# Patient Record
Sex: Female | Born: 1935 | Race: Black or African American | Hispanic: No | Marital: Single
Health system: Midwestern US, Academic
[De-identification: ages and names within clinical notes are randomized; demographics above are authoritative.]

## PROBLEM LIST (undated history)

## (undated) DIAGNOSIS — N3941 Urge incontinence: Principal | ICD-10-CM

## (undated) DIAGNOSIS — I10 Essential (primary) hypertension: Secondary | ICD-10-CM

## (undated) DIAGNOSIS — D175 Benign lipomatous neoplasm of intra-abdominal organs: Secondary | ICD-10-CM

## (undated) DIAGNOSIS — E782 Mixed hyperlipidemia: Secondary | ICD-10-CM

## (undated) DIAGNOSIS — E785 Hyperlipidemia, unspecified: Secondary | ICD-10-CM

## (undated) DIAGNOSIS — K869 Disease of pancreas, unspecified: Secondary | ICD-10-CM

## (undated) DIAGNOSIS — F0390 Unspecified dementia without behavioral disturbance: Secondary | ICD-10-CM

## (undated) DIAGNOSIS — Z1231 Encounter for screening mammogram for malignant neoplasm of breast: Secondary | ICD-10-CM

## (undated) DIAGNOSIS — R944 Abnormal results of kidney function studies: Secondary | ICD-10-CM

## (undated) DIAGNOSIS — R739 Hyperglycemia, unspecified: Secondary | ICD-10-CM

## (undated) DIAGNOSIS — N201 Calculus of ureter: Secondary | ICD-10-CM

## (undated) DIAGNOSIS — E7800 Pure hypercholesterolemia, unspecified: Secondary | ICD-10-CM

## (undated) LAB — HM PAP SMEAR: HM Pap smear: NEGATIVE

## (undated) LAB — HM COLONOSCOPY: HM Colonoscopy: NEGATIVE

## (undated) LAB — HM MAMMOGRAPHY
HM Mammogram: NEGATIVE
HM Mammogram: NEGATIVE

---

## 2005-07-04 LAB — COMPREHENSIVE METABOLIC PANEL
A/G Ratio: 1.6 (ref 1.0–2.1)
ALT: 14 units/L (ref 6–40)
AST: 18 units/L (ref 10–35)
Albumin: 4.1 g/dL (ref 3.6–5.1)
Alkaline Phosphatase: 49 units/L (ref 33–130)
BUN/Creatinine Ratio: 21 (ref 6–22)
BUN: 21 mg/dL (ref 7–25)
CO2: 31 mmol/L (ref 21–33)
Calcium: 9.2 mg/dL (ref 8.6–10.2)
Chloride: 103 mmol/L (ref 98–110)
Creatinine: 1 mg/dL (ref 0.5–1.2)
GFR MDRD Non Af Amer: 58 mL/min (ref >=60)
Globulin, Total: 2.6 g/dL (ref 2.2–3.9)
Glucose: 88 mg/dL (ref 65–99)
Potassium: 3.9 mmol/L (ref 3.5–5.3)
Sodium: 139 mmol/L (ref 135–146)
Total Bilirubin: 0.6 mg/dL (ref 0.2–1.2)
Total Protein: 6.7 g/dL (ref 6.2–8.3)

## 2005-07-04 LAB — LIPID PANEL
Chol/HDL Ratio: 2.3 (ref <=5.0)
Cholesterol, Total: 175 mg/dL (ref 125–200)
HDL: 75 mg/dL (ref >=40)
LDL Cholesterol: 93 mg/dL (ref <130)
Triglycerides: 36 mg/dL (ref <150)

## 2005-07-04 NOTE — Unmapped (Signed)
Signed by   [employer]Log[employer] on 07/05/2005 at 07:40:36  Patient: Stephanie Suarez  Note: All result statuses are Final unless otherwise noted.    Tests: (1) COMPREHENSIVE METABOLIC PANEL W/EGFR (QDL-10231)    GLUCOSE                   88 mg/dL                    24-40                  FASTING REFERENCE INTERVAL    UREA NITROGEN (BUN)       21 mg/dL                    1-02    CREATININE                1.0 mg/dL                   7.2-5.3    GFR ESTIMATED        [L]  58 mL/min/1.52m2            > OR = 60      IF THE PATIENT IS AFRICAN-AMERICAN, PLEASE MULTIPLY      THIS RESULT BY 1.21. THIS RESULT HAS BEEN CALCULATED      ASSUMING THE PATIENT IS NON-AFRICAN AMERICAN.    BUN/CREATININE RATIO (calc)                              21                          6-22    SODIUM                    139 mmol/L                  135-146    POTASSIUM                 3.9 mmol/L                  3.5-5.3    CHLORIDE                  103 mmol/L                  98-110    CARBON DIOXIDE            31 mmol/L                   21-33    CALCIUM                   9.2 mg/dL                   6.6-44.0    PROTEIN, TOTAL            6.7 g/dL                    3.4-7.4    ALBUMIN                   4.1 g/dL                    2.5-9.5    GLOBULIN (calc)  2.6 g/dL                    1.6-1.0   ALBUMIN/GLOBULIN RATIO (calc)                              1.6                         1.0-2.1    BILIRUBIN, TOTAL          0.6 mg/dL                   9.6-0.4    ALKALINE PHOSPHATASE      49 U/L                      33-130    AST                       18 U/L                      10-35    ALT                       14 U/L                      6-40    Note: An exclamation mark (!) indicates a result that was not dispersed into   the flowsheet.  Document Creation Date: 07/05/2005 7:40 AM  _______________________________________________________________________    (1) Order result status: Final  Collection or observation date-time: 07/04/2005 10:16  Requested date-time:    Receipt date-time: 07/04/2005 21:34  Reported date-time: 07/05/2005 07:00  Referring Physician:    Ordering Physician: Maralyn Sago Keyly Baldonado (PRITTSS)  Specimen Source: S  Source: Lucien Mons Order Number: [id number]  Lab site: [facility] [place]      [address]      [place]  [place]  [address]

## 2005-07-04 NOTE — Unmapped (Signed)
Signed by   LinkLogic on 07/05/2005 at 07:40:35  Patient: Stephanie Suarez  Note: All result statuses are Final unless otherwise noted.    Tests: (1) LIPID PANEL (QDL-7600)    TRIGLYCERIDES             36 mg/dL                    <546    CHOLESTEROL, TOTAL        175 mg/dL                   270-350    HDL CHOLESTEROL           75 mg/dL                    > OR = 40   LDL-CHOLESTEROL (calc)                              93 mg/dL                    <093             DESIRABLE RANGE <100 MG/DL FOR PATIENTS WITH CHD OR      DIABETES AND <70 MG/DL FOR DIABETIC PATIENTS WITH      KNOWN HEART DISEASE.          CHOL/HDLC RATIO (calc)                              2.3                         < OR = 5.0    Note: An exclamation mark (!) indicates a result that was not dispersed into   the flowsheet.  Document Creation Date: 07/05/2005 7:40 AM  _______________________________________________________________________    (1) Order result status: Final  Collection or observation date-time: 07/04/2005 10:16  Requested date-time:   Receipt date-time: 07/04/2005 21:34  Reported date-time: 07/05/2005 07:00  Referring Physician:    Ordering Physician: Maralyn Sago Layan Zalenski (PRITTSS)  Specimen Source: S  Source: Lucien Mons Order Number: [id number]  Lab site: [facility] [place]      [address]      [place]  [place]  [address]

## 2006-06-26 LAB — BASIC METABOLIC PANEL
BUN/Creatinine Ratio: 26 (ref 6–22)
BUN: 23 mg/dL (ref 7–25)
CO2: 30 mmol/L (ref 21–33)
Calcium: 9.6 mg/dL (ref 8.6–10.2)
Chloride: 109 mmol/L (ref 98–110)
Creatinine: 0.9 mg/dL (ref 0.50–1.20)
GFR MDRD Non Af Amer: >60 mL/min (ref >=60)
Glucose: 96 mg/dL (ref 65–99)
Potassium: 4.1 mmol/L (ref 3.5–5.3)
Sodium: 145 mmol/L (ref 135–146)

## 2006-06-26 NOTE — Unmapped (Signed)
Signed by   LinkLogic on 06/27/2006 at 07:30:58  Patient: Stephanie Suarez  Note: All result statuses are Final unless otherwise noted.    Tests: (1) BASIC METABOLIC PANEL W/EGFR (QDL-10165)    GLUCOSE                   96 mg/dL                    16-10                  FASTING REFERENCE INTERVAL    UREA NITROGEN (BUN)       23 mg/dL                    9-60    CREATININE                0.9 mg/dL                   0.50-1.20   EGFR NON-AFR. AMERICAN                              >60 mL/min/1.59m2           > OR = 60  ! EGFR AFRICAN AMERICAN                              >60 mL/min/1.32m2           > OR = 60   BUN/CREATININE RATIO (calc)                         [H]  26                          6-22    SODIUM                    145 mmol/L                  135-146    POTASSIUM                 4.1 mmol/L                  3.5-5.3    CHLORIDE                  109 mmol/L                  98-110    CARBON DIOXIDE            30 mmol/L                   21-33    CALCIUM                   9.6 mg/dL                   4.5-40.9    Note: An exclamation mark (!) indicates a result that was not dispersed into   the flowsheet.  Document Creation Date: 06/27/2006 7:30 AM  _______________________________________________________________________    (1) Order result status: Final  Collection or observation date-time: 06/26/2006 12:16  Requested date-time:   Receipt date-time: 06/26/2006 18:22  Reported date-time: 06/27/2006 07:00  Referring Physician:    Ordering Physician: Maralyn Sago  Evalena Fujii (PRITTSS)  Specimen Source: S  Source: Lucien Mons Order Number: [id number]  Lab site: [facility] [place]      [address]      [place]  [place]  [address]

## 2006-08-01 NOTE — Unmapped (Signed)
Signed by Tommi Rumps MD on 08/01/2006 at 15:27:16      Preload Clinical Lists   Problems added:   ROTATOR CUFF SYNDROME (ICD-726.10)  HYPERLIPIDEMIA (ICD-272.4)  HYPERTENSION (ICD-401.9)    Medications added:   LISINOPRIL-HYDROCHLOROTHIAZIDE 20-25 MG TABS (LISINOPRIL-HYDROCHLOROTHIAZIDE)       Past History  Surgical History:  No surgical history.  Family History: Mother - peripartum death  Father - prostate cancer  Daughter - suicide  Son - schizophrenia, HTN, sleep apnea  Social History: Children: 4,   Employment Status: retired,   Occupation: Pharmacist, community  Exercise: walks daily,   Alcohol Use: none  Drug Use: none  Tobacco Usage:non-smoker  Jehovah's Witness (no blood products)      Preventive Maintenance

## 2006-08-05 LAB — LIPID PANEL
Chol/HDL Ratio: 3.2 (ref <=5.0)
Cholesterol, Total: 263 mg/dL — ABNORMAL HIGH (ref 125–200)
HDL: 81 mg/dL (ref >=40)
LDL Cholesterol: 172 mg/dL — ABNORMAL HIGH (ref <130)
Triglycerides: 50 mg/dL (ref <150)

## 2006-08-05 LAB — COMPREHENSIVE METABOLIC PANEL
A/G Ratio: 1.3 (ref 1.0–2.1)
ALT: 10 units/L (ref 6–40)
AST: 15 units/L (ref 10–35)
Albumin: 4 g/dL (ref 3.6–5.1)
Alkaline Phosphatase: 56 units/L (ref 33–130)
BUN/Creatinine Ratio: 25 (ref 6–22)
BUN: 27 mg/dL (ref 7–25)
CO2: 30 mmol/L (ref 21–33)
Calcium: 9.1 mg/dL (ref 8.6–10.2)
Chloride: 106 mmol/L (ref 98–110)
Creatinine: 1.1 mg/dL (ref 0.50–1.20)
GFR MDRD Af Amer: 59 mL/min (ref >=60)
GFR MDRD Non Af Amer: 48 mL/min (ref >=60)
Globulin, Total: 3 g/dL (ref 2.2–3.9)
Glucose: 91 mg/dL (ref 65–99)
Potassium: 3.9 mmol/L (ref 3.5–5.3)
Sodium: 143 mmol/L (ref 135–146)
Total Bilirubin: 0.4 mg/dL (ref 0.2–1.2)
Total Protein: 7 g/dL (ref 6.2–8.3)

## 2006-08-05 NOTE — Unmapped (Signed)
Signed by   LinkLogic on 08/06/2006 at 07:29:14  Patient: Stephanie Suarez  Note: All result statuses are Final unless otherwise noted.    Tests: (1) LIPID PANEL (QDL-7600)    TRIGLYCERIDES             50 mg/dL                    <578    CHOLESTEROL, TOTAL   [H]  263 mg/dL                   469-629    HDL CHOLESTEROL           81 mg/dL                    > OR = 40   LDL-CHOLESTEROL (calc)                         [H]  172 mg/dL                   <528             DESIRABLE RANGE <100 MG/DL FOR PATIENTS WITH CHD OR      DIABETES AND <70 MG/DL FOR DIABETIC PATIENTS WITH      KNOWN HEART DISEASE.          CHOL/HDLC RATIO (calc)                              3.2                         < OR = 5.0    Note: An exclamation mark (!) indicates a result that was not dispersed into   the flowsheet.  Document Creation Date: 08/06/2006 7:29 AM  _______________________________________________________________________    (1) Order result status: Final  Collection or observation date-time: 08/05/2006 08:24  Requested date-time:   Receipt date-time: 08/05/2006 18:24  Reported date-time: 08/06/2006 07:00  Referring Physician:    Ordering Physician: Maralyn Sago Damir Leung (PRITTSS)  Specimen Source: S  Source: Lucien Mons Order Number: [id number]  Lab site: [facility] [place]      [address]      [place]  [place]  [address]

## 2006-08-05 NOTE — Unmapped (Signed)
Signed by   LinkLogic on 08/06/2006 at 07:29:15  Patient: Stephanie Suarez  Note: All result statuses are Final unless otherwise noted.    Tests: (1) COMPREHENSIVE METABOLIC PANEL W/EGFR (QDL-10231)    GLUCOSE                   91 mg/dL                    16-10                  FASTING REFERENCE INTERVAL    UREA NITROGEN (BUN)  [H]  27 mg/dL                    9-60    CREATININE                1.1 mg/dL                   0.50-1.20   eGFR NON-AFR. AMERICAN                         [L]  48 mL/min/1.63m2            > OR = 60   eGFR AFRICAN AMERICAN                         [L]  59 mL/min/1.43m2            > OR = 60   BUN/CREATININE RATIO (calc)                         [H]  25                          6-22    SODIUM                    143 mmol/L                  135-146    POTASSIUM                 3.9 mmol/L                  3.5-5.3    CHLORIDE                  106 mmol/L                  98-110    CARBON DIOXIDE            30 mmol/L                   21-33    CALCIUM                   9.1 mg/dL                   4.5-40.9    PROTEIN, TOTAL            7.0 g/dL                    8.1-1.9    ALBUMIN                   4.0 g/dL  3.6-5.1    GLOBULIN (calc)           3.0 g/dL                    4.5-4.0   ALBUMIN/GLOBULIN RATIO (calc)                              1.3                         1.0-2.1    BILIRUBIN, TOTAL          0.4 mg/dL                   9.8-1.1    ALKALINE PHOSPHATASE      56 U/L                      33-130    AST                       15 U/L                      10-35    ALT                       10 U/L                      6-40            REPORT COMMENT:      PATIENT FASTING    Note: An exclamation mark (!) indicates a result that was not dispersed into   the flowsheet.  Document Creation Date: 08/06/2006 7:29 AM  _______________________________________________________________________    (1) Order result status: Final  Collection or observation date-time: 08/05/2006 08:24  Requested date-time:    Receipt date-time: 08/05/2006 18:24  Reported date-time: 08/06/2006 07:00  Referring Physician:    Ordering Physician: Maralyn Sago Zakyia Gagan (PRITTSS)  Specimen Source: S  Source: Lucien Mons Order Number: [id number]  Lab site: [facility]       [address]      [place]  [place]  [address]

## 2006-08-06 NOTE — Unmapped (Signed)
Signed by Lysle Dingwall MA on 08/06/2006 at 09:19:53      Preload Clinical Lists   Problems added:   HEARING LOSS, RIGHT EAR (ICD-389.9)  ROTATOR CUFF SYNDROME (ICD-726.10)  HYPERLIPIDEMIA (ICD-272.4)  HYPERTENSION (ICD-401.9)    Medications added:   LISINOPRIL-HYDROCHLOROTHIAZIDE 10-12.5 MG TABS (LISINOPRIL-HYDROCHLOROTHIAZIDE) Use as directed.      Past History  Family History: Mother - peripartum death   Father - Prostate CA deceased  Daughter - suicide  Son - schizophrenia, HTN, sleep apnea  Social History: Marital Status: divorced,   Children: 4,   Employment Status: retired,   Occupation: Pharmacist, community  Exercise: walks daily,   Alcohol Use: none  Drug Use: none  Tobacco Usage:non-smoker  Jehovah's Witness (no blood products)      Preventive Maintenance

## 2006-09-24 NOTE — Unmapped (Signed)
Signed by Jimmie Molly MA on 09/24/2006 at 15:25:00    [facility]  Division of Digestive Diseases        SURGERY / PROCEDURE SCHEDULE SHEET     Requested Date: 11/20/2006    Requested Time: 10:00 am    Length of Surgery: 30 minutes      Physician: Normal H. Cassell Smiles MD    Facility: [facility]    Type of patient: Outside Referral    Patient is: Out Pt.    Medications:   LISINOPRIL-HYDROCHLOROTHIAZIDE 10-12.5 MG TABS (LISINOPRIL-HYDROCHLOROTHIAZIDE) Use as directed.    Allergies: No Known Allergies    * Latex Sensitive: No    Procedure:     Procedure: Colonoscopy - Screening    Diagnoses: V76.51 screening    Prep: Nu-Lytely    Patient Information:     Name: Stephanie Suarez    DOB: 1930-01-12    SSN: [id number]    Address: [address]  [place], [place]  [address]    Gender: Female    Home phone: [phone]    Work phone: [phone]    IDX #: [id number]    Last Word #: [id number]    Primary Insurance: [facility] M/CARE COMP/SECURE HORIZONS    Member ID #: [id number]

## 2006-11-20 NOTE — Unmapped (Signed)
Signed by Stefan Church MD on 11/20/2006 at 00:00:00  Colonoscopy      Imported By: Coletta Memos 11/29/2006 16:00:37    _____________________________________________________________________    External Attachment:    Please see [employer] EMR for this document.

## 2006-11-26 NOTE — Unmapped (Signed)
Signed by Tresa Endo Dartis MA on 11/26/2006 at 15:27:57    Prescriptions:  SIMVASTATIN 20 MG TABS (SIMVASTATIN) 1 daily  #30 x 1   Entered by: Tresa Endo Dartis MA   Authorized by: Tommi Rumps MD   Signed by: Tresa Endo Dartis MA on 11/26/2006   Method used: Telephoned to ...        RxID: [id number]

## 2006-11-30 NOTE — Unmapped (Signed)
Signed by Tommi Rumps MD on 11/30/2006 at 09:53:06      Preload Clinical Lists   Problems added:   HEARING LOSS, RIGHT EAR (ICD-389.9)  ROTATOR CUFF SYNDROME (ICD-726.10)  HYPERLIPIDEMIA (ICD-272.4)  HYPERTENSION (ICD-401.9)    Medications added:   LISINOPRIL-HYDROCHLOROTHIAZIDE 10-12.5 MG TABS (LISINOPRIL-HYDROCHLOROTHIAZIDE) Use as directed.  SIMVASTATIN 20 MG TABS (SIMVASTATIN) 1 daily      Past History  Past Medical History:  Colorectal Polyps, Adenoma on colonoscopy August 2008      Preventive Maintenance     Colonoscopy Test Date: 11/20/2006 Colonoscopy: Abnormal

## 2007-02-07 NOTE — Unmapped (Signed)
Signed by Oswaldo Done on 02/07/2007 at 09:19:11    Prescriptions:  LISINOPRIL-HYDROCHLOROTHIAZIDE 20-25 MG  TABS (LISINOPRIL-HYDROCHLOROTHIAZIDE) 1 by mouth DAILY  #90 x 3   Entered by: Oswaldo Done   Authorized by: Everlene Farrier MD   Signed by: Oswaldo Done on 02/07/2007   Method used: Telephoned to ...     [facility]          ,        Ph: [phone]     Fax: [phone]   RxID: [id number]

## 2007-12-10 NOTE — Unmapped (Signed)
Signed by   LinkLogic on 12/10/2007 at 08:50:59  Patient: Stephanie Suarez  Note: All result statuses are Final unless otherwise noted.    Tests: (1) DX ANKLE,3V+ RT (63016)    Order Note:     Order Note: Non-EMR Ordering Provider: Inda Castle,   MD    Order Note:     Order Note: Confidential Patient Information: Accompanied are [facility]   results that are being delivered by [facility].   If you receive a clinical result for a patient that is not yours please   contact [facility] at [phone].    Order Note:      FINDINGS- Three views of the right ankle are negative for fracture  or dislocation. The tibiotalar space is symmetric. There is mild  soft tissue swelling about the joint.     IMPRESSION- Negative for fracture.          Read ByEyvonne Mechanic  M.D.       Released ByEyvonne Mechanic  M.D.       Released Date Time- 12/10/07 3220          Transcriptionist- Eyvonne Mechanic  M.D.     ------------------------------------------------------------------------------      ! DX ANKLE,3V+ RT           Result Below...        RESULT: See Report for Impression  (R)    Note: An exclamation mark (!) indicates a result that was not dispersed into   the flowsheet.  Document Creation Date: 12/10/2007 8:50 AM  _______________________________________________________________________    (1) Order result status: Final  Collection or observation date-time: 12/10/2007 07:51  Requested date-time:   Receipt date-time:   Reported date-time: 12/10/2007 08:32  Referring Physician:    Ordering Physician:  Non-EMR Physician [facility])  Specimen Source:   Source: Damaris Hippo Order Number: [id number] RADIOLOGY  Lab site:

## 2007-12-17 NOTE — Unmapped (Signed)
Signed by Tommi Rumps MD on 12/17/2007 at 00:00:00  Medication Prior Authorization      Imported By: Scharlene Corn 12/18/2007 11:52:28    _____________________________________________________________________    External Attachment:    Please see [employer] EMR for this document.

## 2007-12-17 NOTE — Unmapped (Signed)
Signed by Tommi Rumps MD on 12/17/2007 at 13:58:00      Reason for Visit   Chief Complaint: fup on bp     History from: patient    Allergies  No Known Allergies    Medications   Current Meds:   LISINOPRIL-HYDROCHLOROTHIAZIDE 20-25 MG  TABS (LISINOPRIL-HYDROCHLOROTHIAZIDE) 1 by mouth DAILY        Vital Signs:   Wt: 142 lbs.      Pulse: 66 (regular)  BP: 134/66  Cuff size: regular    Intake recorded by: Maurine Cane on December 17, 2007 1:32 PM    History of Present Illness   1. CPE; knows she needs a mammogram, wonders when next colonoscopy is due (due in 2011), pap last year was normal  2. here for BP check  3. declines flu vaccine    Past History  Past Medical History (reviewed - no changes required):  Colorectal Polyps, Adenoma on colonoscopy August 2008  Family History (reviewed - no changes required): Mother - peripartum death   Father - Prostate CA deceased  Daughter - suicide  Son - schizophrenia, HTN, sleep apnea  Social History (reviewed - no changes required): Marital Status: divorced,   Children: 4,   Employment Status: retired,   Occupation: Pharmacist, community  Exercise: walks daily,   Alcohol Use: none  Drug Use: none  Tobacco Usage:non-smoker  Jehovah's Witness (no blood products)        Physical Examination:   BP: 134/  66    Physical Exam- Detail:   General Appearance: well-developed, well-nourished and in no acute distress.  Skin: many SK's  Oropharynx: Normal appearance.  No erythema, exudate or mass. No tonsillar swelling.  Oral Cavity: Gums pink, good dentition.  Oral mucosa and tongue without lesions.  Respiratory: Respiration un-labored.  Lung fields clear to auscultation.  No wheezing, rales, rhonchi or pleural rub.  Neck: No thyromegaly.  No nodules, masses or tenderness.  Lymphatic: Areas palpated not enlarged:  cervical, supraclavicular.  Breast: Breasts symmetrical.  No lumps, masses, discharge, tenderness or dimpling.  Chaperone: present  Initials: DL  Cardiac: S1 and S2 normal.  RRR  without murmurs, rubs, gallops.  No JVD.  Vascular: no edema  Psychiatric: Judgement and insight are within normal limits.  Alert and oriented x3.  No mood disorders noted, appropriate affect.  Musculoskeletal: Gait coordinated and smooth.  Digits are without clubbing or cyanosis.           New Problems:  WELL ADULT (ICD-V70.0)  New Medications:  LISINOPRIL-HYDROCHLOROTHIAZIDE 20-25 MG  TABS (LISINOPRIL-HYDROCHLOROTHIAZIDE) 1 by mouth DAILY please fax order to [phone]      Preventive Maintenance     Colonoscopy: Abnormal (11/20/2006 9:53:06 AM)           Prescriptions:  LISINOPRIL-HYDROCHLOROTHIAZIDE 20-25 MG  TABS (LISINOPRIL-HYDROCHLOROTHIAZIDE) 1 by mouth DAILY please fax order to [phone]  #90 x 3   Entered and Authorized by: Tommi Rumps MD   Signed by: Tommi Rumps MD on 12/17/2007   Method used: Print then Give to Patient   RxID: [id number]      Assessment and Plan     Problems   Status of Existing Problems:  Assessed WELL ADULT as comment only - breast exam normal, declines flu vax, to get mammo, UTD with colonoscopy - Tommi Rumps MD  Assessed HYPERLIPIDEMIA as unchanged - check fasting lipid profile - Tommi Rumps MD  Assessed HYPERTENSION as unchanged - Controlled, same medication, check renal panel - Tommi Rumps  MD  New Problems:  Dx of WELL ADULT (ICD-V70.0)  Onset: 12/17/2007    Medications   New Prescriptions/Refills:  LISINOPRIL-HYDROCHLOROTHIAZIDE 20-25 MG  TABS (LISINOPRIL-HYDROCHLOROTHIAZIDE) 1 by mouth DAILY please fax order to [phone]  #90 x 3, 12/17/2007, Tommi Rumps MD    Today's Orders   Lipid Profile   (FATS) (7600) [CPT-80061]  Comp Metabolic Panel  (METAPNL) (10231) [*CPT-80053]  56387 - Preventive, Est, 65+ yr [CPT-99397]    Disposition:   Return to clinic for Doctor Visit in 1 year(s)

## 2007-12-18 LAB — COMPREHENSIVE METABOLIC PANEL
A/G Ratio: 1.4 (ref 1.0–2.1)
ALT: 8 units/L (ref 6–40)
AST: 15 units/L (ref 10–35)
Albumin: 3.9 g/dL (ref 3.6–5.1)
Alkaline Phosphatase: 49 units/L (ref 33–130)
BUN: 24 mg/dL (ref 7–25)
CO2: 29 mmol/L (ref 21–33)
Calcium: 9.1 mg/dL (ref 8.6–10.2)
Chloride: 106 mmol/L (ref 98–110)
Creatinine: 1.1 mg/dL (ref 0.63–1.22)
GFR MDRD Af Amer: 58 mL/min (ref >=60)
GFR MDRD Non Af Amer: 48 mL/min (ref >=60)
Globulin, Total: 2.7 g/dL (ref 2.2–3.9)
Glucose: 91 mg/dL (ref 65–99)
Potassium: 4.1 mmol/L (ref 3.5–5.3)
Sodium: 143 mmol/L (ref 135–146)
Total Bilirubin: 0.4 mg/dL (ref 0.2–1.2)
Total Protein: 6.6 g/dL (ref 6.2–8.3)

## 2007-12-18 LAB — LIPID PANEL
Chol/HDL Ratio: 3.4 (ref <=5.0)
Cholesterol, Total: 249 mg/dL — ABNORMAL HIGH (ref 125–200)
HDL: 73 mg/dL (ref >=46)
LDL Cholesterol: 165 mg/dL — ABNORMAL HIGH (ref <130)
Triglycerides: 54 mg/dL (ref <150)

## 2007-12-18 NOTE — Unmapped (Signed)
Signed by Tommi Rumps MD on 12/19/2007 at 08:48:18  Patient: Stephanie Suarez  Note: All result statuses are Final unless otherwise noted.    Tests: (1) COMPREHENSIVE METABOLIC PANEL W/EGFR (QDL-10231)    GLUCOSE                   91 mg/dL                    09-32                  FASTING REFERENCE INTERVAL    UREA NITROGEN (BUN)       24 mg/dL                    3-55    CREATININE                1.1 mg/dL                   0.63-1.22   eGFR NON-AFR. AMERICAN                         [L]  48 mL/min/1.90m2            > OR = 60   eGFR AFRICAN AMERICAN                         [L]  58 mL/min/1.90m2            > OR = 60   BUN/CREATININE RATIO (calc)                              NOT APPLICABLE              6-22      BUN/CREATININE RATIO IS NOT REPORTED WHEN THE BUN      AND CREATININE VALUES ARE WITHIN NORMAL LIMITS.    SODIUM                    143 mmol/L                  135-146    POTASSIUM                 4.1 mmol/L                  3.5-5.3    CHLORIDE                  106 mmol/L                  98-110    CARBON DIOXIDE            29 mmol/L                   21-33    CALCIUM                   9.1 mg/dL                   7.3-22.0    PROTEIN, TOTAL            6.6 g/dL                    2.5-4.2    ALBUMIN  3.9 g/dL                    4.4-0.3    GLOBULIN (calc)           2.7 g/dL                    4.7-4.2   ALBUMIN/GLOBULIN RATIO (calc)                              1.4                         1.0-2.1    BILIRUBIN, TOTAL          0.4 mg/dL                   5.9-5.6    ALKALINE PHOSPHATASE      49 U/L                      33-130    AST                       15 U/L                      10-35    ALT                       8 U/L                       6-40            REPORT COMMENT:      FASTING    Note: An exclamation mark (!) indicates a result that was not dispersed into   the flowsheet.  Document Creation Date: 12/18/2007 4:59 PM  _______________________________________________________________________    (1)  Order result status: Final  Collection or observation date-time: 12/18/2007 08:01  Requested date-time:   Receipt date-time: 12/18/2007 14:08  Reported date-time: 12/18/2007 16:00  Referring Physician:    Ordering Physician: Maralyn Sago Jenevieve Kirschbaum (PRITTSS)  Specimen Source: S  Source: [name] Order Number: [id number]  Lab site: [facility] [place]      [address]      [place]  [place]  [address]      -----------------    The following non-numeric lab results were dispersed to  the flowsheet even though numeric results were expected:      BUN/CREATININE RATIO (calc), NOT APPLICABLE

## 2007-12-18 NOTE — Unmapped (Signed)
Signed by Tommi Rumps MD on 12/19/2007 at 08:48:18  Patient: Stephanie Suarez  Note: All result statuses are Final unless otherwise noted.    Tests: (1) LIPID PANEL (QDL-7600)    CHOLESTEROL, TOTAL   [H]  249 mg/dL                   016-010    HDL CHOLESTEROL           73 mg/dL                    > OR = 46    TRIGLYCERIDES             54 mg/dL                    <932   LDL-CHOLESTEROL (calc)                         [H]  165 mg/dL                   <355             DESIRABLE RANGE <100 MG/DL FOR PATIENTS WITH CHD OR      DIABETES AND <70 MG/DL FOR DIABETIC PATIENTS WITH      KNOWN HEART DISEASE.          CHOL/HDLC RATIO (calc)                              3.4                         < OR = 5.0    Note: An exclamation mark (!) indicates a result that was not dispersed into   the flowsheet.  Document Creation Date: 12/18/2007 4:59 PM  _______________________________________________________________________    (1) Order result status: Final  Collection or observation date-time: 12/18/2007 08:01  Requested date-time:   Receipt date-time: 12/18/2007 14:08  Reported date-time: 12/18/2007 16:00  Referring Physician:    Ordering Physician: Maralyn Sago Aubry Tucholski (PRITTSS)  Specimen Source: S  Source: [name] Order Number: [id number]  Lab site: [facility] [place]      [address]      [place]  [place]  [address]

## 2007-12-19 NOTE — Unmapped (Signed)
Signed by Tommi Rumps MD on 12/19/2007 at 08:52:48            Tommi Rumps, MD  [facility]  [address]  [place], [place] [address]  Phone [phone]  Fax [phone]      December 19, 2007      [facility]  [address]    [place], [place] [address]                                                                                           RE:  TEST RESULTS   (Stephanie Suarez--Nov 10, 1929)         Dear [name]:      The following is an interpretation of your most recent tests.  Please take note of any instructions provided.        Glucose test normal:  91   Lipid panel:  Abnormal          Triglyceride: 54   Cholesterol: 249   LDL: 165   HDL: 73   Chol/HDL%:  3.4       Additional Comments: Your cholesterol is still really high. Last year, I had put you on a cholesterol medicine (simvastatin), but you are not taking it.  Would you be willing to go back on it?  Please call the office or make an appointment to discuss this with me.         Sincerely,      Dyann Ruddle, MD

## 2007-12-29 NOTE — Unmapped (Signed)
Signed by Oswaldo Done on 12/29/2007 at 16:54:49    PHONE NOTE - Patient Call    Call back at Home Phone: [phone]  Caller: patient  Department: Family Medicine  Call for: Dr Audie Box    Reason for Call: Patient would like to start back taking Rx Simvaslatin, do not know mg, she get her medication mail order, mail to home when ready.      Initial call taken by: Oswaldo Done,  December 29, 2007 8:33 AM      New Medications:  SIMVASTATIN 20 MG TABS (SIMVASTATIN) 1 by mouth daily    Prescriptions:  SIMVASTATIN 20 MG TABS (SIMVASTATIN) 1 by mouth daily  #90 x 3   Entered and Authorized by: Tommi Rumps MD   Signed by: Tommi Rumps MD on 12/29/2007   Method used: Telephoned to ...     [facility]     [address]     [place], [place]  [address]     Ph: [phone] or [phone]     Fax: [phone]   RxID: [id number]

## 2007-12-29 NOTE — Unmapped (Signed)
Signed by Oswaldo Done on 12/29/2007 at 16:56:19    Prescriptions:  SIMVASTATIN 20 MG TABS (SIMVASTATIN) 1 by mouth daily  #90 x 3   Entered by: Oswaldo Done   Authorized by: Tommi Rumps MD   Signed by: Oswaldo Done on 12/29/2007   Method used: Print then Give to Patient   RxID: [id number]    Mail to Patient home.  ..................................................................Marland KitchenClementine Ozier  December 29, 2007 4:56 PM

## 2008-01-23 NOTE — Unmapped (Signed)
Signed by Oswaldo Done on 01/23/2008 at 12:32:37    Prescriptions:  SIMVASTATIN 20 MG TABS (SIMVASTATIN) 1 by mouth daily  #90 x 3   Entered by: Oswaldo Done   Authorized by: Tommi Rumps MD   Signed by: Oswaldo Done on 01/23/2008   Method used: Reprint   RxID: [id number]

## 2008-03-11 LAB — LIPID PANEL
Chol/HDL Ratio: 2.8 (ref <=5.0)
Cholesterol, Total: 213 mg/dL — ABNORMAL HIGH (ref 125–200)
HDL: 75 mg/dL (ref >=46)
LDL Cholesterol: 124 mg/dL (ref <130)
Triglycerides: 68 mg/dL (ref <150)

## 2008-03-11 LAB — HEPATIC FUNCTION PANEL
A/G Ratio: 1.3 (ref 1.0–2.1)
ALT: 10 units/L (ref 6–40)
AST: 14 units/L (ref 10–35)
Albumin: 3.9 g/dL (ref 3.6–5.1)
Alkaline Phosphatase: 54 units/L (ref 33–130)
Bilirubin, Direct: 0.1 mg/dL (ref <=0.2)
Bilirubin, Indirect: 0.4 mg/dL (ref 0.2–1.2)
Globulin, Total: 2.9 g/dL (ref 2.2–3.9)
Total Bilirubin: 0.5 mg/dL (ref 0.2–1.2)
Total Protein: 6.8 g/dL (ref 6.2–8.3)

## 2008-03-11 NOTE — Unmapped (Signed)
Signed by Tommi Rumps MD on 03/11/2008 at 13:34:37      Reason for Visit   Chief Complaint: eye exam for glaucoma    History from: patient    Allergies  No Known Allergies    Medications   LISINOPRIL-HYDROCHLOROTHIAZIDE 20-25 MG  TABS (LISINOPRIL-HYDROCHLOROTHIAZIDE) 1 by mouth DAILY please fax order to [phone]  SIMVASTATIN 20 MG TABS (SIMVASTATIN) 1 by mouth daily        Vital Signs:   Wt: 146 lbs.      Wt chg (lbs): 4  degrees F  oral  Pulse: 68 (regular)    Patient appears to be in acute distress: no  BP: 124/72  Cuff size: regular    Intake recorded by: Sonia Side MA on March 11, 2008 10:04 AM    Audiometry Screening   Left ear-500 hz: 0  Right ear-500 hz: 0  Left ear-1000 hz: 0  Left ear-2000 hz: 40  Right ear-2000 hz: 40  Left ear-4000 hz: 40  Right ear-4000 hz: 40    History of Present Illness   She got a letter saying she needed a glaucoma check  can't hear anything out of left ear.  restarted simvastatin in september; is fasting for labs today.    Past History  Past Medical History (reviewed - no changes required):  Colorectal Polyps, Adenoma on colonoscopy August 2008  Family History (reviewed - no changes required): Mother - peripartum death   Father - Prostate CA deceased  Daughter - suicide  Son - schizophrenia, HTN, sleep apnea  Social History (reviewed - no changes required): Marital Status: divorced,   Children: 4,   Employment Status: retired,   Occupation: Pharmacist, community  Exercise: walks daily,   Alcohol Use: none  Drug Use: none  Tobacco Usage:non-smoker  Jehovah's Witness (no blood products)        Physical Examination:   BP: 124/  72    Physical Exam- Detail:   General Appearance: well-developed, well-nourished and in no acute distress.  Ears: cerumen in both canals; hearing decreased for conversation  Respiratory: Respiration un-labored.  Lung fields clear to auscultation.  No wheezing, rales, rhonchi or pleural rub.  Neck: No thyromegaly.  No nodules, masses or  tenderness.  Cardiac: S1 and S2 normal.  RRR without murmurs, rubs, gallops.  No JVD.  Vascular: no edema  Psychiatric: Judgement and insight are within normal limits.  Alert and oriented x3.  No mood disorders noted, appropriate affect.  Musculoskeletal: Gait coordinated and smooth.  Digits are without clubbing or cyanosis.           New Problems:  SCREENING FOR GLAUCOMA (ICD-V80.1)      Preventive Maintenance               Assessment and Plan     Problems   Status of Existing Problems:  Assessed SCREENING FOR GLAUCOMA as comment only - referred to ophtho. no symptoms. - Tommi Rumps MD  Assessed HEARING LOSS, RIGHT EAR as comment only - referred to ENT. She does have cerumen in her ear canals, but she likely has sensorineural hearing loss, too. - Tommi Rumps MD  Assessed HYPERLIPIDEMIA as comment only - recently restarted simvastatin. Check fasting labs. Tommi Rumps MD  New Problems:  Dx of SCREENING FOR GLAUCOMA (ICD-V80.1)  Onset: 03/11/2008  Today's Orders   Ophthalmology Consult [phone]  ENT Consult [phone]  Lipid Profile   (FATS) (7600) [CPT-80061]  Hepatic Function    (LIVP) (10256)  [  CPT-80076]  99214 - Ofc Vst, Est Level IV [CPT-99214]    Disposition:   Return to clinic for Doctor Visit in 6 month(s)

## 2008-03-11 NOTE — Unmapped (Signed)
Signed by Tommi Rumps MD on 03/12/2008 at 14:58:10  Patient: Stephanie Suarez  Note: All result statuses are Final unless otherwise noted.    Tests: (1) HEPATIC FUNCTION PANEL (QDL-10256)    PROTEIN, TOTAL            6.8 g/dL                    5.1-7.6    ALBUMIN                   3.9 g/dL                    1.6-0.7    GLOBULIN (calc)           2.9 g/dL                    3.7-1.0   ALBUMIN/GLOBULIN RATIO (calc)                              1.3                         1.0-2.1    BILIRUBIN, TOTAL          0.5 mg/dL                   6.2-6.9    BILIRUBIN, DIRECT         0.1 mg/dL                   < OR = 0.2   BILIRUBIN, INDIRECT (calc)                              0.4 mg/dL                   4.8-5.4    ALKALINE PHOSPHATASE      54 U/L                      33-130    AST                       14 U/L                      10-35    ALT                       10 U/L                      6-40            REPORT COMMENT:      FASTING    Note: An exclamation mark (!) indicates a result that was not dispersed into   the flowsheet.  Document Creation Date: 03/12/2008 12:12 AM  _______________________________________________________________________    (1) Order result status: Final  Collection or observation date-time: 03/11/2008 10:57  Requested date-time:   Receipt date-time: 03/11/2008 22:15  Reported date-time: 03/11/2008 23:00  Referring Physician:    Ordering Physician: Maralyn Sago Yashvi Jasinski (PRITTSS)  Specimen Source: S  Source: [name] Order Number: [id number]  Lab site: [facility] [place]      [address]      [place]  [place]  [address]

## 2008-03-11 NOTE — Unmapped (Signed)
Signed by Tommi Rumps MD on 03/12/2008 at 14:58:10  Patient: Stephanie Suarez  Note: All result statuses are Final unless otherwise noted.    Tests: (1) LIPID PANEL (QDL-7600)    CHOLESTEROL, TOTAL   [H]  213 mg/dL                   409-811    HDL CHOLESTEROL           75 mg/dL                    > OR = 46    TRIGLYCERIDES             68 mg/dL                    <914   LDL-CHOLESTEROL (calc)                              124 mg/dL                   <782             DESIRABLE RANGE <100 MG/DL FOR PATIENTS WITH CHD OR      DIABETES AND <70 MG/DL FOR DIABETIC PATIENTS WITH      KNOWN HEART DISEASE.          CHOL/HDLC RATIO (calc)                              2.8                         < OR = 5.0    Note: An exclamation mark (!) indicates a result that was not dispersed into   the flowsheet.  Document Creation Date: 03/12/2008 12:12 AM  _______________________________________________________________________    (1) Order result status: Final  Collection or observation date-time: 03/11/2008 10:57  Requested date-time:   Receipt date-time: 03/11/2008 22:15  Reported date-time: 03/11/2008 23:00  Referring Physician:    Ordering Physician: Maralyn Sago Stephen Baruch (PRITTSS)  Specimen Source: S  Source: [name] Order Number: [id number]  Lab site: [facility] [place]      [address]      [place]  [place]  [address]

## 2008-03-12 NOTE — Unmapped (Signed)
Signed by Tommi Rumps MD on 03/12/2008 at 14:59:10            Tommi Rumps, MD  [facility]  [address]  [place], [place] [address]  Phone [phone]  Fax [phone]      March 12, 2008      [facility]  [address]    [place], [place] [address]                                                                                           RE:  TEST RESULTS   (Avonna Calica--[birth year])         Dear Ms. Baumgardner:      The following is an interpretation of your most recent tests.  Please take note of any instructions provided.   Lipid panel:   Improved, Good - Keep taking your simvastatin. It is helping your cholesterol.       Triglyceride: 68   Cholesterol: 213   LDL: 124   HDL: 75   Chol/HDL%:  2.8       Additional Comments: Please feel free to call the office if you have questions.           Sincerely,      Dyann Ruddle, MD

## 2008-08-23 LAB — URINALYSIS WITH REFLEX TO CULTURE
Bilirubin, Urine: NEGATIVE
Blood, Urine: NEGATIVE
Glucose, UA: NEGATIVE
Ketones, Urine: NEGATIVE
Nitrite, Urine: NEGATIVE
Protein, UA: NEGATIVE
Specific Gravity, UA: >=1.03 (ref 1.003–1.030)
Urobilinogen, Urine: 0.2 EU/dl (ref <2.0)
pH, UA: 5.5 (ref 4.5–8.0)

## 2008-08-23 NOTE — ED Provider Notes (Unsigned)
PATIENT NAME                  PA #             MR #                  Amber Palmer, [place]                   [id number]       [id number]            EMERGENCY ROOM PHYSICIAN                 ADM DATE                     Burr Medico, MD                        08/23/2008                   DATE OF BIRTH    AGE            PATIENT TYPE      RM #               1930-03-04       75             ERK                                     REASON FOR VISIT:  Flank pain.     HISTORY OF PRESENT ILLNESS:  This is a 75 year old who presents with left  flank pain.  States it has been going on for 3 weeks.  It comes and goes.   Occasionally, shoots down her leg to her knee.  No weakness problems,  numbness or tingling  sensation.  No _____.  Has some discomfort after she  urinates.  No fevers or chills, otherwise no complaints.     PAST MEDICAL HISTORY:  Hypertension, hypercholesterolemia.     MEDICATIONS:  Please see list.     ALLERGIES:  No known drug allergies.       SOCIAL HISTORY:  The patient does not smoke, does not drink.     FAMILY HISTORY:  Noncontributory.     REVIEW OF SYSTEMS:  As above.       PHYSICAL EXAMINATION:  VITAL SIGNS:  Temperature 98, pulse 86, respiration 16, blood pressure  115/66, O2 saturation 98% on room air.    GENERAL:  She is awake and alert in no acute distress.  HEENT:  Unremarkable.  LUNGS:  Clear.    CARDIOVASCULAR:  Regular.      ABDOMEN:  Soft, nondistended.  Positive bowel sounds.    BACK:  Normal inspection some discomfort palpated left paralumbar region.    EXTREMITIES:  Straight leg raise is negative.  Reflexes 2+ patella.       The rest of the exam was unremarkable.       ASSESSMENT:  This is a 75 year old with a urinary tract infection.  She also  had some radicular type pain.  I am going to give her tramadol and Cipro.  I  gave her instructions for follow up and return.  Burr Medico, MD     [id number]  DD: 08/23/2008 07:11  DT: 08/24/2008 09:22  Job #: [id number]

## 2008-08-28 NOTE — Unmapped (Signed)
Signed by Edrick Corcovado MD on 08/30/2008 at 08:38:55      Reason for Visit   Chief Complaint: vomiting, concerned about dehydration    History from: patient    Allergies  No Known Allergies    Medications   LISINOPRIL-HYDROCHLOROTHIAZIDE 20-25 MG  TABS (LISINOPRIL-HYDROCHLOROTHIAZIDE) 1 by mouth DAILY please fax order to [phone]  SIMVASTATIN 20 MG TABS (SIMVASTATIN) 1 by mouth daily        Vital Signs:   Wt: 145 lbs.      Wt chg (lbs): -1  Temperature: 97.7  degrees F  oral  Pulse: 60  BP: 110/56    Intake recorded by: Fulton Mole MA on August 28, 2008 10:30 AM    History of Present Illness   Chief Complaint: dehydrated  Had back pain on L side and was seen at [facility] on 08/23/08.  Dx'd with UTI, ?kidney infection.  Was given Cipro for 10 days and Tramadol and has been having trouble catching her breath since then.  Three or four weeks ago, she had some mild intermittant dyspnea.  Adherent with both Cipro twice a day day 5/10 and tramadol every 6 hours even though she is not in pain.  No ED records available.   No wheezing or nocturnal cough.  No h/o pulm dz.  Has seasonal allergies, that are mild with chronic rhinorrhea and postnasal drip.  Not treating.     PAST HISTORY  Past Medical History:  Environmental Allergies, Hyperlipidemia, Hypertension, Colorectal Polyps, Adenoma on colonoscopy August 2008        Physical Examination:   BP: 110/  56    Physical Exam- Detail:   General Appearance: well cared for female, comfortable  Skin: MMM and nl skin turgor  Eyes: Sclera white, conjunctiva without injection and pallor.  PERRLA.  EOMI  Ears: No lesions.  Tympanic membranes translucent, non-bulging.  Canal walls pink, without discharge.  Hearing grossly intact.  Nose/Face: turbinates edematous, clear rhinorrhea, no sinus tenderness  Oropharynx: posterior injection  Oral Cavity: Gums pink, good dentition.  Oral mucosa and tongue without lesions.  Respiratory: Respiration un-labored.  Lung fields clear to  auscultation.  No wheezing, rales, rhonchi or pleural rub.  Neck: No thyromegaly.  No nodules, masses or tenderness.  Lymphatic: Areas palpated not enlarged:  cervical, supraclavicular.  Cardiac: Nl precordium, S1S2 RRR no ectopy  Vascular: 2+ peripheral pulses, no LE edema  Psychiatric: Judgement and insight are within normal limits.  Alert and oriented x3.  No mood disorders noted, appropriate affect.           New Problems:  DYSPNEA (ICD-786.09)  RHINOSINUSITIS, ALLERGIC, CHRONIC (ICD-477.9)  New Medications:  TRAMADOL HCL 50 MG TABS (TRAMADOL HCL) 1 tablet by mouth every 6 hours  CIPRO 500 MG TABS (CIPROFLOXACIN HCL) 1 tablet by mouth twice a day  FLONASE 50 MCG/ACT SUSP (FLUTICASONE PROPIONATE (NASAL)) Take two sprays in each nostril for allergies      Preventive Maintenance             Prescriptions:  FLONASE 50 MCG/ACT SUSP (FLUTICASONE PROPIONATE (NASAL)) Take two sprays in each nostril for allergies  #1 x 3   Entered and Authorized by: Edrick Hayden MD   Signed by: Edrick Seward MD on 08/28/2008   Method used: Print then Give to Patient   RxID: [id number]      Assessment and Plan   1. Dyspnea- Stop tramadol as may be ADE.  No suspicion of CV or pulm  etiology.  May be due to some bronchoconstriction 2/2 atopic dz as allergies out of control.  Call if symptoms fail to improve.  2. Allergic rhinosinusitis- Flonase.    Problems New Problems:  Dx of DYSPNEA (ICD-786.09)  Onset: 08/28/2008  Dx of RHINOSINUSITIS, ALLERGIC, CHRONIC (ICD-477.9)  Onset: 08/28/2008    Medications   New Prescriptions/Refills:  FLONASE 50 MCG/ACT SUSP (FLUTICASONE PROPIONATE (NASAL)) Take two sprays in each nostril for allergies  #1 x 3, 08/28/2008, Edrick [place] MD    Today's Orders   [phone] - Ofc Vst, Est Level III [IHK-74259]  Pulse oximetry [CPT-94760]

## 2008-11-09 NOTE — Unmapped (Signed)
Signed by Edrick Simms MD on 11/10/2008 at 10:56:32      Reason for Visit   Chief Complaint: 6 month fup htn     History from: patient    Allergies  No Known Allergies    Medications   LISINOPRIL-HYDROCHLOROTHIAZIDE 20-25 MG  TABS (LISINOPRIL-HYDROCHLOROTHIAZIDE) 1 by mouth DAILY please fax order to [phone]  SIMVASTATIN 20 MG TABS (SIMVASTATIN) 1 by mouth daily  FLONASE 50 MCG/ACT SUSP (FLUTICASONE PROPIONATE (NASAL)) Take two sprays in each nostril for allergies        Vital Signs:   Wt: 146 lbs.      Wt chg (lbs): 1  Pulse: 66 (regular)  BP: 130/70    Intake recorded by: Vista Lawman on November 09, 2008 11:11 AM    History of Present Illness   Chief Complaint: white coating on her tongue  1. Every morning when she wakes up, has a white coating on her tongue. Easily removed by wiping with a wet rag.  Onset 3 weeks ago.  No pain.  Only using flonase when she needs it. Only used it 5 times in the last 3 weeks.  Prior to that she was using it every day for about 1 month.  Coating on tongue hasn't changed.   Doesn't come back during the day, but will the next morning.  No new meds. Wears dentures during the day.  Not cleaning dentures.  2. HTN- Adherent with lisinopril-HCTZ.  No CP, SOB, LE edema.  Intermittant cough.    PAST HISTORY  Past Medical History (reviewed - no changes required):  Environmental Allergies, Hyperlipidemia, Hypertension, Colorectal Polyps, Adenoma on colonoscopy August 2008  Social History (reviewed - no changes required): Marital Status: divorced,   Children: 4,   Employment Status: retired,   Occupation: Pharmacist, community  Exercise: walks daily,   Alcohol Use: none  Drug Use: none  Tobacco Usage:non-smoker  Jehovah's Witness (no blood products)        Physical Examination:   BP: 130/  70    Physical Exam- Detail:   General Appearance: Well cared for lean female, comfortable and pleasant  Eyes: Sclera white, conjunctiva without injection and pallor.  PERRLA.  EOMI  Ears: No lesions.   Tympanic membranes translucent, non-bulging.  Canal walls pink, without discharge.  Hearing grossly intact.  Nose/Face: Mucosa and turbinates pink, septum midline. No polyps, no discharge, no lesions.  Oropharynx: Normal appearance.  No erythema, exudate or mass. No tonsillar swelling.  Oral Cavity: Gums pink, edentulous.  Oral mucosa and tongue without lesions.  Respiratory: Respiration un-labored.  Lung fields clear to auscultation.  No wheezing, rales, rhonchi or pleural rub.  Neck: No thyromegaly.  No nodules, masses or tenderness.  Lymphatic: Areas palpated not enlarged:  cervical, supraclavicular.  Cardiac: Nl precordium, S1S2 RRR no ectopy  Vascular: 2+ peripheral pulses, no LE edema  Psychiatric: Judgement and insight are within normal limits.  Alert and oriented x3.  No mood disorders noted, appropriate affect.      Labs/Tests Reviewed  Serum Glucose: 91 (12/18/2007 8:01:00 AM)  Total Protein: 6.8 (03/11/2008 10:57:00 AM)  Albumin: 3.9 (03/11/2008 10:57:00 AM)  AST: 14 (03/11/2008 10:57:00 AM)  ALT: 10 (03/11/2008 10:57:00 AM)  Alkaline Phosphatase: 54 (03/11/2008 10:57:00 AM)  Total Bilirubin: 0.5 (03/11/2008 10:57:00 AM)  Direct Bilirubin: 0.4 (03/11/2008 10:57:00 AM)  Serum Cholesterol 213 (03/11/2008 10:57:00 AM)  LDL: 124 (03/11/2008 10:57:00 AM)  HDL: 75 (03/11/2008 10:57:00 AM)  Triglyceride: 68 (03/11/2008 10:57:00 AM)  Sodium: 143 (  12/18/2007 8:01:00 AM)  Potassium: 4.1 (12/18/2007 8:01:00 AM)  Chloride: 106 (12/18/2007 8:01:00 AM)  BUN: 24 (12/18/2007 8:01:00 AM)  Creatinine: 1.1 (12/18/2007 8:01:00 AM)  Serum Calcium: 9.1 (12/18/2007 8:01:00 AM)       New Problems:  CANDIDIASIS, ORAL (ICD-112.0)  New Medications:  B COMPLEX FORMULA 1  TABS (VITAMINS-LIPOTROPICS) Take one tablet daily  FLUCONAZOLE 100 MG TABS (FLUCONAZOLE) Take one tablet by mouth daily for 2 weeks for infection      Preventive Maintenance             Prescriptions:  FLUCONAZOLE 100 MG TABS (FLUCONAZOLE) Take one tablet by mouth  daily for 2 weeks for infection  #14 x 0   Entered and Authorized by: Edrick [place] MD   Signed by: Edrick Hardesty MD on 11/09/2008   Method used: Print then Give to Patient   RxID: [id number]      Assessment and Plan   1. Oral candidiasis is most c/w described problem.  No findings on exam today.  Diflucan, clean dentures regularly.l  2. HTN- Well controlled. Con't current tx.    Problems New Problems:  Dx of CANDIDIASIS, ORAL (ICD-112.0)  Onset: 11/09/2008    Medications   New Prescriptions/Refills:  FLUCONAZOLE 100 MG TABS (FLUCONAZOLE) Take one tablet by mouth daily for 2 weeks for infection  #14 x 0, 11/09/2008, Edrick [place] MD    Today's Orders   [phone] - Ofc Vst, Est Level IV [HYQ-65784]    Disposition:   Return to clinic for Doctor Visit in 4 month(s)   Appointment Reason: f/u HTN, hyperchol, labs

## 2009-02-11 LAB — LIPID PANEL
Chol/HDL Ratio: 2.7 (ref <=5.0)
Cholesterol, Total: 231 mg/dL (ref 125–200)
HDL: 85 mg/dL (ref >=46)
LDL Cholesterol: 133 mg/dL (ref <130)
Triglycerides: 65 mg/dL (ref <150)

## 2009-02-11 LAB — COMPREHENSIVE METABOLIC PANEL
A/G Ratio: 1.3 (ref 1.0–2.1)
ALT: 12 units/L (ref 6–40)
AST: 15 units/L (ref 10–35)
Albumin: 4.4 g/dL (ref 3.6–5.1)
Alkaline Phosphatase: 55 units/L (ref 33–130)
BUN/Creatinine Ratio: 23 (ref 6–22)
BUN: 27 mg/dL (ref 7–25)
CO2: 27 mmol/L (ref 21–33)
Calcium: 9.6 mg/dL (ref 8.6–10.2)
Chloride: 103 mmol/L (ref 98–110)
Creatinine: 1.18 mg/dL (ref 0.63–1.22)
GFR MDRD Af Amer: 54 mL/min (ref >=60)
GFR MDRD Non Af Amer: 44 mL/min (ref >=60)
Globulin, Total: 3.3 g/dL (ref 2.2–3.9)
Glucose: 98 mg/dL (ref 65–99)
Potassium: 3.8 mmol/L (ref 3.5–5.3)
Sodium: 141 mmol/L (ref 135–146)
Total Bilirubin: 0.5 mg/dL (ref 0.2–1.2)
Total Protein: 7.7 g/dL (ref 6.2–8.3)

## 2009-02-11 LAB — CBC
Hematocrit: 37.8 % (ref 35.0–45.0)
Hemoglobin: 12.7 g/dL (ref 11.7–15.5)
MCH: 33.5 pg (ref 27.0–33.0)
MCHC: 33.5 g/dL (ref 32.0–36.0)
MCV: 100.1 fL (ref 80.0–100.0)
Platelets: 114 10*3/uL (ref 140–400)
RBC: 3.78 10*6/uL (ref 3.80–5.10)
RDW: 13.1 % (ref 11.0–15.0)
WBC: 6 10*3/uL (ref 3.8–10.8)

## 2009-02-11 LAB — HEMOGLOBIN A1C: Hemoglobin A1C: 5.8 % (ref <5.7)

## 2009-02-11 LAB — MICROALBUMIN (RANDOM UR)
Creatinine, Random Urine: 195 mL/min (ref 20–320)
Microalb / UCreat: 11 mg/g (ref <30)
Microalb, Ur: 2.1 mg/dL

## 2009-02-11 NOTE — Unmapped (Signed)
Signed by Tresa Endo Dartis MA on 02/11/2009 at 14:41:27    PHONE NOTE  Caller: patient  Department: Family Medicine  Call for: DR Timothy Lasso    Reason for Call: pt would [facility]      Initial call taken by: Thresa Ross MA,  February 11, 2009 9:15 AM      FOLLOW UP  [facility] and [facility] who states that they have not filled any meds for the pt in a long time and her info will have to be updated by her or a family member. # to call is [phone].  Follow-up by:  Thresa Ross MA,  February 11, 2009 9:21 AM    FOLLOW UP  pt will call us after info is updated so we can call rx[facility].  patient advised  Follow-up by:  Thresa Ross MA,  February 11, 2009 2:17 PM    FOLLOW UP  [facility]  patient advised, phone call completed  Follow-up by:  Hardie Lora MA,  February 11, 2009 2:41 PM

## 2009-02-11 NOTE — Unmapped (Signed)
Signed by Edrick Sheffield MD on 02/17/2009 at 12:39:14  Patient: Stephanie Suarez  Note: All result statuses are Final unless otherwise noted.    Tests: (1) MICROALBUMIN, RANDOM URINE (W/CREATININE) (QDL-6517)   CREATININE, RANDOM URINE                              195 mg/dL                   16-109    MICROALBUMIN              2.1 mg/dL      Reference Range      Not established   MICROALBUMIN/CREATININE RATIO, RANDOM URINE                              11 mcg/mg creat             <30             The ADA defines abnormalities in albumin      excretion as follows:             Category         Result (mcg/mg creatinine)             Normal                    <30      Microalbuminuria         30-299       Clinical albuminuria   > OR = 300             The ADA recommends that at least two of three      specimens collected within a 3-6 month period be      abnormal before considering a patient to be      within a diagnostic category.    Note: An exclamation mark (!) indicates a result that was not dispersed into   the flowsheet.  Document Creation Date: 02/12/2009 9:33 AM  _______________________________________________________________________    (1) Order result status: Final  Collection or observation date-time: 02/11/2009 09:00  Requested date-time:   Receipt date-time: 02/11/2009 09:01  Reported date-time: 02/12/2009 09:00  Referring Physician:    Ordering Physician: Edrick Henderson (BOLONS)  Specimen Source: R  Source: [name] Order Number: [id number]  Lab site: [facility] [place]      [address]      [place]  [place]  [address]      -----------------    The following lab values were dispersed to the flowsheet  with no units conversion:      CREATININE, RANDOM URINE, 195 MG/DL, (F)  expected units: mL/min    MICROALBUMIN/CREATININE RATIO, RANDOM URINE, 11 MCG/MG CREAT, (F)  expected   units: mg/g

## 2009-02-11 NOTE — Unmapped (Signed)
Signed by Edrick French Camp MD on 02/17/2009 at 12:39:14  Patient: Stephanie Suarez  Note: All result statuses are Final unless otherwise noted.    Tests: (1) COMPREHENSIVE METABOLIC PANEL W/eGFR (QDL-10231)    GLUCOSE                   98 mg/dL                    16-10                        Fasting reference interval           UREA NITROGEN (BUN)  [H]  27 mg/dL                    9-60    CREATININE                1.18 mg/dL                  0.63-1.22   eGFR NON-AFR. AMERICAN                         [L]  44 mL/min/1.39m2            > OR = 60   eGFR AFRICAN AMERICAN                         [L]  54 mL/min/1.35m2            > OR = 60   BUN/CREATININE RATIO (calc)                         [H]  23                          6-22    SODIUM                    141 mmol/L                  135-146    POTASSIUM                 3.8 mmol/L                  3.5-5.3    CHLORIDE                  103 mmol/L                  98-110    CARBON DIOXIDE            27 mmol/L                   21-33    CALCIUM                   9.6 mg/dL                   4.5-40.9    PROTEIN, TOTAL            7.7 g/dL                    8.1-1.9    ALBUMIN                   4.4  g/dL                    1.9-1.4    GLOBULIN (calc)           3.3 g/dL                    7.8-2.9   ALBUMIN/GLOBULIN RATIO (calc)                              1.3                         1.0-2.1    BILIRUBIN, TOTAL          0.5 mg/dL                   5.6-2.1    ALKALINE PHOSPHATASE      55 U/L                      33-130    AST                       15 U/L                      10-35    ALT                       12 U/L                      6-40    Note: An exclamation mark (!) indicates a result that was not dispersed into   the flowsheet.  Document Creation Date: 02/12/2009 9:33 AM  _______________________________________________________________________    (1) Order result status: Final  Collection or observation date-time: 02/11/2009 09:00  Requested date-time:   Receipt date-time:  02/11/2009 09:01  Reported date-time: 02/12/2009 09:00  Referring Physician:    Ordering Physician: Edrick Mertens (BOLONS)  Specimen Source: S  Source: [name] Order Number: [id number]  Lab site: [facility] [place]      [address]      [place]  [place]  [address]

## 2009-02-11 NOTE — Unmapped (Signed)
Signed by Edrick Chester MD on 02/17/2009 at 12:38:44      Reason for Visit   Chief Complaint: 3 month fup    History from: patient    Allergies  No Known Allergies    Medications   LISINOPRIL-HYDROCHLOROTHIAZIDE 20-25 MG  TABS (LISINOPRIL-HYDROCHLOROTHIAZIDE) 1 by mouth DAILY please fax order to [phone]  SIMVASTATIN 20 MG TABS (SIMVASTATIN) 1 by mouth daily  FLONASE 50 MCG/ACT SUSP (FLUTICASONE PROPIONATE (NASAL)) Take two sprays in each nostril for allergies  FLUCONAZOLE 100 MG TABS (FLUCONAZOLE) Take one tablet by mouth daily for 2 weeks for infection        Vital Signs:   Wt: 144 lbs.      Wt chg (lbs): -2  Pulse: 84 (regular)    Patient appears to be in acute distress: no  BP: 126/58  Cuff size: regular  Barriers to Care/Communication: None    Intake recorded by: Sonia Side MA on February 11, 2009 7:56 AM    History of Present Illness   Chief Complaint: f/u HTN  1. HTN-  Not monitoring at home.  Has occassional chest discomfort at night underneath her breast.  Only lasts a few minutes.  Feels like a muscle soreness.  When she pushes on it, it feels better.  Always in the same spot.  Hasn't happened in some time.  She thinks that it is gas.  No palpitation.  No orthostasis.  No LE edema.    2. Hands get really cold in the winter months.  Has been happening for years.  Wearing gloves and mittens doesn't help. Don't turn blue.  3. Hyperchol- Adherent with simvastatin.  4. Allergies- Flonase is effective. Not using daily, only as needed.      PAST HISTORY  Past Medical History (reviewed - no changes required):  Environmental Allergies, Hyperlipidemia, Hypertension, Colorectal Polyps, Adenoma on colonoscopy August 2008  Social History (reviewed - no changes required): Marital Status: divorced,   Children: 4,   Employment Status: retired,   Occupation: Pharmacist, community  Exercise: walks daily,   Alcohol Use: none  Drug Use: none  Tobacco Usage:non-smoker  Jehovah's Witness (no blood  products)        Physical Examination:   BP: 126/  58    Physical Exam- Detail:   General Appearance: Well cared for female, comfortable and pleasant  Skin: palms erythematous, no scale or rash  Eyes: Sclera white, conjunctiva without injection and pallor.  PERRLA.  EOMI. Nl fundi  Ears: No lesions.  Tympanic membranes translucent, non-bulging.  Canal walls pink, without discharge.  Hearing grossly intact.  Nose/Face: Mucosa and turbinates pink, septum midline. No polyps, no discharge, no lesions.  Oropharynx: Normal appearance.  No erythema, exudate or mass. No tonsillar swelling.  Oral Cavity: Gums pink, good dentition.  Oral mucosa and tongue without lesions.  Respiratory: Respiration un-labored.  Lung fields clear to auscultation.  No wheezing, rales, rhonchi or pleural rub.  Neck: No thyromegaly.  No nodules, masses or tenderness.  Lymphatic: Areas palpated not enlarged:  cervical, supraclavicular.  Cardiac: Nl precordium, S1S2 RRR 1/6 SEM at sternal border, no gallop  Vascular: 2+ peripheral pulses, cap refill <3 sec, no Reynauds  Abdomen: No masses or tenderness. Bowel sounds active x4 quad.  Liver and spleen are without tenderness or enlargement.  Psychiatric: Judgement and insight are within normal limits.  Alert and oriented x3.  No mood disorders noted, appropriate affect.      Labs/Tests Reviewed  Serum Glucose: 91 (  12/18/2007 8:01:00 AM)  Total Protein: 6.8 (03/11/2008 10:57:00 AM)  Albumin: 3.9 (03/11/2008 10:57:00 AM)  AST: 14 (03/11/2008 10:57:00 AM)  ALT: 10 (03/11/2008 10:57:00 AM)  Alkaline Phosphatase: 54 (03/11/2008 10:57:00 AM)  Total Bilirubin: 0.5 (03/11/2008 10:57:00 AM)  Direct Bilirubin: 0.4 (03/11/2008 10:57:00 AM)  Serum Cholesterol 213 (03/11/2008 10:57:00 AM)  LDL: 124 (03/11/2008 10:57:00 AM)  HDL: 75 (03/11/2008 10:57:00 AM)  Triglyceride: 68 (03/11/2008 10:57:00 AM)  Sodium: 143 (12/18/2007 8:01:00 AM)  Potassium: 4.1 (12/18/2007 8:01:00 AM)  Chloride: 106 (12/18/2007 8:01:00  AM)  BUN: 24 (12/18/2007 8:01:00 AM)  Creatinine: 1.1 (12/18/2007 8:01:00 AM)  Serum Calcium: 9.1 (12/18/2007 8:01:00 AM)  Pap smear: Neg (08/01/2006 12:00:00 AM)       New Problems:  SCREENING, DIABETES MELLITUS (ICD-V77.1)  New Medications:  SIMVASTATIN 20 MG TABS (SIMVASTATIN) Take one tablet by mouth daily for cholesterol  VITAMIN C 500 MG TABS (ASCORBIC ACID) Take one tablet by mouth daily      Preventive Maintenance         Influenza Vaccine     Vaccine Type: Fluvax     Date/Time: 02/11/2009 7:54 AM     Site: right deltoid     Mfr: [employer]     Dose: 0.5 ml     Route: IM     Given by: Sonia Side MA     Exp. Date: 09/22/2009     Lot #: [id number]     VIS given: 11/02/2008 version given February 11, 2009.    Flu Vaccine Consent Questions      Do you have a history of severe allergic reactions to this vaccine? no     Any prior history of allergic reactions to egg and/or gelatin? no     Do you have a sensitivity to the preservative Thimersol? no     Do you have a past history of Guillan-Barre Syndrome? no     Do you currently have an acute febrile illness? no     Have you ever had a severe reaction to latex? no     Vaccine information given and explained to patient? yes     Are you currently pregnant? no        Prescriptions:  FLONASE 50 MCG/ACT SUSP (FLUTICASONE PROPIONATE (NASAL)) Take two sprays in each nostril for allergies  #1 x 3   Entered and Authorized by: Edrick [place] MD   Signed by: Edrick Volta MD on 02/11/2009   Method used: Print then Give to Patient   RxID: [id number]  SIMVASTATIN 20 MG TABS (SIMVASTATIN) Take one tablet by mouth daily for cholesterol  #90 x 4   Entered and Authorized by: Edrick [place] MD   Signed by: Edrick Rockwall MD on 02/11/2009   Method used: Print then Give to Patient   RxID: [id number]      Assessment and Plan   1. HTN- Check home readings. May need to decrease lisinopril.  2. Hyperchol- Check FLP. Con't simvastatin.  3. Allergies- Con't flonase.  4.  Red palms and cold hands- Check CBC as first step.      Problems New Problems:  Dx of SCREENING, DIABETES MELLITUS (ICD-V77.1)  Onset: 02/11/2009    Medications   New Prescriptions/Refills:  FLONASE 50 MCG/ACT SUSP (FLUTICASONE PROPIONATE (NASAL)) Take two sprays in each nostril for allergies  #1 x 3, 02/11/2009, Edrick [place] MD  SIMVASTATIN 20 MG TABS (SIMVASTATIN) Take one tablet by mouth daily for cholesterol  #90 x 4, 02/11/2009, Edrick [place] MD  Today's Orders   99213 - Ofc Vst, Est Level III [CPT-99213]  Comp Metabolic Panel  (METAPNL) (10231) [*CPT-80053]  Lipid Panel w/Reflex to Direct LDL (14852) [CPT-80061]  Hemoglobin  A1C   (GLYCO) (496)  [CPT-83036]  CBC without Diff   (CBC) (1759) [CPT-85027]  Urine Microalbumin w CR, (UMALB) (6517) [CPT-82043]    Disposition:   Return to clinic for Doctor Visit in 6 month(s)   Appointment Reason: f/u HTN

## 2009-02-11 NOTE — Unmapped (Signed)
Signed by Edrick Mellen MD on 02/17/2009 at 12:39:14  Patient: Stephanie Suarez  Note: All result statuses are Final unless otherwise noted.    Tests: (1) HEMOGLOBIN A1c (QDL-496)   HEMOGLOBIN A1c (% of total Hgb)                         [H]  5.8 %                       <5.7                      Increased risk of diabetes                       <5.7       Decreased risk of diabetes                       5.7-6.0    Increased risk of diabetes                       6.1-6.4    Higher risk of diabetes                       > or = 6.5 Consistent with diabetes                         Standards of Medical Care in Diabetes-2010.                  Diabetes Care, 33(Supp 1): N8-G95,6213.            REPORT COMMENT:      FASTING    Note: An exclamation mark (!) indicates a result that was not dispersed into   the flowsheet.  Document Creation Date: 02/12/2009 9:33 AM  _______________________________________________________________________    (1) Order result status: Final  Collection or observation date-time: 02/11/2009 09:00  Requested date-time:   Receipt date-time: 02/11/2009 09:01  Reported date-time: 02/12/2009 09:00  Referring Physician:    Ordering Physician: Edrick Bethany (BOLONS)  Specimen Source: B  Source: [name] Order Number: [id number]  Lab site: [facility] [place]      [address]      [place]  [place]  [address]

## 2009-02-11 NOTE — Unmapped (Signed)
Signed by Edrick Marie MD on 02/17/2009 at 12:38:44  Patient: Stephanie Suarez  Note: All result statuses are Final unless otherwise noted.    Tests: (1) LIPID PANEL WITH REFLEX TO DIRECT LDL (ZOX-09604)    TRIGLYCERIDES             65 mg/dL                    <540    CHOLESTEROL, TOTAL   [H]  231 mg/dL                   981-191    HDL CHOLESTEROL           85 mg/dL                    > OR = 46   LDL-CHOLESTEROL (calc)                         [H]  133 mg/dL                   <478             Desirable range <100 mg/dL for patients with CHD or      diabetes and <70 mg/dL for diabetic patients with      known heart disease.          CHOL/HDLC RATIO (calc)                              2.7                         < OR = 5.0    Note: An exclamation mark (!) indicates a result that was not dispersed into   the flowsheet.  Document Creation Date: 02/12/2009 9:33 AM  _______________________________________________________________________    (1) Order result status: Final  Collection or observation date-time: 02/11/2009 09:00  Requested date-time:   Receipt date-time: 02/11/2009 09:01  Reported date-time: 02/12/2009 09:00  Referring Physician:    Ordering Physician: Edrick Wilson (BOLONS)  Specimen Source: S  Source: [name] Order Number: [id number]  Lab site: [facility] [place]      [address]      [place]  [place]  [address]

## 2009-02-11 NOTE — Unmapped (Signed)
Signed by Edrick [place] MD on 02/17/2009 at 12:39:14  Patient: Stephanie Suarez  Note: All result statuses are Final unless otherwise noted.    Tests: (1) CBC (H/H, RBC, INDICES, WBC, PLT) (QDL-1759)   WHITE BLOOD CELL COUNT                              6.0 Thousand/uL             3.8-10.8    RED BLOOD CELL COUNT [L]  3.78 Million/uL             3.80-5.10    HEMOGLOBIN                12.7 g/dL                   16.1-09.6    HEMATOCRIT                37.8 %                      35.0-45.0    MCV                  [H]  100.1 fL                    80.0-100.0    MCH                  [H]  33.5 pg                     27.0-33.0    MCHC                      33.5 g/dL                   04.5-40.9    RDW                       13.1 %                      11.0-15.0    PLATELET COUNT       [L]  114 Thousand/uL             140-400    Note: An exclamation mark (!) indicates a result that was not dispersed into   the flowsheet.  Document Creation Date: 02/12/2009 9:33 AM  _______________________________________________________________________    (1) Order result status: Final  Collection or observation date-time: 02/11/2009 09:00  Requested date-time:   Receipt date-time: 02/11/2009 09:01  Reported date-time: 02/12/2009 09:00  Referring Physician:    Ordering Physician: Edrick [place] (BOLONS)  Specimen Source: B  Source: [name] Order Number: [id number]  Lab site: [facility] [place]      [address]      [place]  [facility]  [address]      -----------------    The following lab values were dispersed to the flowsheet  with no units conversion:      WHITE BLOOD CELL COUNT, 6.0 THOUSAND/UL, (F)  expected units: 10*3/mm3    RED BLOOD CELL COUNT, 3.78 MILLION/UL, (F)  expected units: 10*6/mm3    PLATELET COUNT, 114 THOUSAND/UL, (F)  expected units: 10*3/mm3

## 2009-02-17 NOTE — Unmapped (Signed)
Signed by Edrick Kinnelon MD on 02/17/2009 at 12:46:44                   [facility]         [facility]         [address]         [place], [place]  [address]         p [phone] f [phone]                     Edrick Ball Club, MD, MPH  [facility]  [address]  [place], [place] [address]  Phone: [phone]  Fax: [phone]      February 17, 2009      [facility]  [address]    [place], [place] [address]                                                                                           RE:  TEST RESULTS   (Stephanie Suarez--06-24-1929)         Dear Ms. Haig:      The following is an interpretation of your most recent tests.  Please take note of any instructions provided.   Electrolyte studies:  Normal         Glucose test normal:  98   Kidney function studies:  Fair - This test of your kidneys is slightly abnormal.  We will recheck it in 6 months.  Liver function studies:  Normal    Lipid panel:  Normal, Good          Triglyceride: 65   Cholesterol: 231   LDL: 133   HDL: 85   Chol/HDL%:  2.7   Diabetic studies:  Abnormal - Over the past 3 months, your sugar level has been just a little higher than normal.       HgbA1c: 5.8      Microalbumin:  Normal - This more sensitive test of your kidney function is normal. This is reassuring.  We will repeat both of these tests in 6 months.         Microalbumin: 2.1        Microalbumin/Creatinine Ratio: 11 MCG/MG CREAT      CBC results (Blood Counts):    I would like to check some additional tests that may explain why your palms are red.  Please schedule a lab appointment to do this.      Additional Comments: Please continue to work on your diet and exercise to help control your sugar levels.  I will call you when I get the results of your additional blood tests.  Please call me if you have concerns before then.           Thank you,           Sincerely,         Edrick [place], M.D. MPH

## 2009-03-07 NOTE — Unmapped (Signed)
Signed by Tresa Endo Dartis MA on 03/08/2009 at 08:45:55    PHONE NOTE  Caller: patient  Department: Family Medicine  Call for: DR Timothy Lasso    Reason for Call: refill medication. LV 01/2009      Initial call taken by: Thresa Ross MA,  March 07, 2009 2:54 PM      New Medications:  LISINOPRIL-HYDROCHLOROTHIAZIDE 20-25 MG  TABS (LISINOPRIL-HYDROCHLOROTHIAZIDE) Take one tablet by mouth daily for blood pressure    FOLLOW UP  phone call completed, Rx completed  Follow-up by:  Hardie Lora MA,  March 08, 2009 8:45 AM    Prescriptions:  LISINOPRIL-HYDROCHLOROTHIAZIDE 20-25 MG  TABS (LISINOPRIL-HYDROCHLOROTHIAZIDE) Take one tablet by mouth daily for blood pressure  #90 x 3   Entered and Authorized by: Edrick [place] MD   Signed by: Edrick Naco MD on 03/07/2009   Method used: Telephoned to ...     [facility]     [address]     [place], [place]  [address]     Ph: [phone] or [phone]     Fax: [phone]   RxID: [id number]

## 2009-03-07 NOTE — Unmapped (Signed)
Signed by Edrick [place] MD on 03/07/2009 at 00:00:00  blood pressure readings      Imported By: Scharlene Corn 03/08/2009 08:11:11    _____________________________________________________________________    External Attachment:    Please see [employer] EMR for this document.

## 2009-03-07 NOTE — Unmapped (Signed)
Signed by Sonia Side MA on 03/07/2009 at 09:25:41    PHONE NOTE    Reason for Call: paper work.   PHONE NOTE  Call placed to: Patient  Summary of call: Please call pt and inform her that I have reviewed her home BP reachings.  THey are at goal. I do not recommend decreasing her lisinopril. Con't on her current medications.   Call placed by: Edrick [place] MD,  March 07, 2009 6:01 AM      FOLLOW UP  patient advised, phone call completed  Follow-up by:  Sonia Side MA,  March 07, 2009 9:25 AM

## 2009-06-18 NOTE — Unmapped (Signed)
Signed by Theador Hawthorne MD on 06/21/2009 at 13:03:38      Reason for Visit   Chief Complaint: fall, knot on head bruise left eye happened thursday     History from: patient    Allergies  No Known Allergies    Medications   LISINOPRIL-HYDROCHLOROTHIAZIDE 20-25 MG  TABS (LISINOPRIL-HYDROCHLOROTHIAZIDE) Take one tablet by mouth daily for blood pressure  SIMVASTATIN 20 MG TABS (SIMVASTATIN) Take one tablet by mouth daily for cholesterol  FLONASE 50 MCG/ACT SUSP (FLUTICASONE PROPIONATE (NASAL)) Take two sprays in each nostril for allergies  VITAMIN C 500 MG TABS (ASCORBIC ACID) Take one tablet by mouth daily    1    Vital Signs:   Wt: 150 lbs.      Wt chg (lbs): 6  Pulse: 80 (regular)  BP: 144/70    Intake recorded by: Vista Lawman on June 18, 2009 11:01 AM    History of Present Illness   Fall and has a knot on her head.  was walking and tripped on a tree trunk and hit head   2 days ago.  now puffy and swollen le61ft eye.  had knot above the eye.  normal vision.      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     PAST HISTORY  Past Medical History (reviewed - no changes required):  Environmental Allergies, Hyperlipidemia, Hypertension, Colorectal Polyps, Adenoma on colonoscopy August 2008  Family History (reviewed - no changes required): Mother - peripartum death   Father - Prostate CA deceased  Daughter - suicide  Son - schizophrenia, HTN, sleep apnea  Social History (reviewed - no changes required): Marital Status: divorced,   Children: 4,   Employment Status: retired,   Occupation: Pharmacist, community  Exercise: walks  daily,   Alcohol Use: none  Drug Use: none  Tobacco Usage:non-smoker  Jehovah's Witness (no blood products)        Physical Examination:   BP: 144/  70    Physical Exam- Detail:   General Appearance: well-developed, well-nourished and in no acute distress.  Eyes: eccymosis around left eye  without tenderness.  Ears: PERRL  Nose/Face: Tender area over the left brow.  no boney defects.  Psychiatric: Slight decreased memory but reported at baseline per family.           New Problems:  CONTUSION OF FACE SCALP AND NECK EXCEPT EYE (ICD-920)      Preventive Maintenance               Assessment and Plan     Problems   Status of Existing Problems:  Assessed CONTUSION OF FACE SCALP AND NECK EXCEPT EYE as comment only - new: tylenol as needed for pain.  monitor for any neuro or mental status changes. Theador Hawthorne MD  New Problems:  Dx of CONTUSION OF FACE SCALP AND NECK EXCEPT EYE (ICD-920)  Onset: 06/18/2009  Today's Orders   99213 - Ofc Vst, Est Level III [[id number]]                  ]

## 2009-08-11 NOTE — Unmapped (Signed)
Signed by Viviann Spare MD on 08/11/2009 at 13:41:36      Reason for Visit   Chief Complaint: check mole on the underside of left breast.    History from: patient    Allergies  No Known Allergies    Medications   LISINOPRIL-HYDROCHLOROTHIAZIDE 20-25 MG  TABS (LISINOPRIL-HYDROCHLOROTHIAZIDE) Take one tablet by mouth daily for blood pressure  SIMVASTATIN 20 MG TABS (SIMVASTATIN) Take one tablet by mouth daily for cholesterol  FLONASE 50 MCG/ACT SUSP (FLUTICASONE PROPIONATE (NASAL)) Take two sprays in each nostril for allergies  VITAMIN C 500 MG TABS (ASCORBIC ACID) Take one tablet by mouth daily        Vital Signs:   Wt: 148 lbs.      Wt chg (lbs): -2  Pulse: 76 (regular)    Patient appears to be in acute distress: no  BP: 150/60  Cuff size: regular  Barriers to Care/Communication: None    Intake recorded by: Sonia Side MA on Aug 11, 2009 10:10 AM    History of Present Illness   She complains of a mole on her left breast that has been there for years.  She has a lot of moles under her breasts.  She denies having any of them removed or a h/o skin cancer.  She denies bleeding or irritations.      PAST HISTORY  Past Medical History (reviewed - no changes required):  Environmental Allergies, Hyperlipidemia, Hypertension, Colorectal Polyps, Adenoma on colonoscopy August 2008  Social History (reviewed - no changes required): Marital Status: divorced,   Children: 4,   Employment Status: retired,   Occupation: Pharmacist, community  Exercise: walks daily,   Alcohol Use: none  Drug Use: none  Tobacco Usage:non-smoker  Jehovah's Witness (no blood products)    Review of Systems  General: Denies fatigue.   Skin: Complains of moles, suspicious lesions. Denies rash, skin color change, pruritus.       Physical Examination:   BP: 150/  60    Physical Exam- Detail:   General Appearance: well-developed, well-nourished and in no acute distress.  Skin: Multiple nevus on neck, under breasts and back  Under left breast-3 x 2 cm seborrehic  keratosis  Respiratory: Respiration un-labored.  Lung fields clear to auscultation.  No wheezing, rales, rhonchi or pleural rub.  Cardiac: S1 and S2 normal.  RRR without murmurs, rubs, gallops.  No JVD.          Preventive Maintenance               Assessment and Plan   1. Seborrehic keratosis and multiple moles-refer to dermatology  2. HTN-uncontrolled, she had not taken her medications today.  Today's Orders   Dermatology Consult [UCP-11111]  [phone] - Ofc Vst, Est Level III [id number]    Disposition:   Return to clinic for Doctor Visit as needed                   Preventive Maintenance       ]

## 2009-10-19 NOTE — Unmapped (Signed)
Signed by Jimmie Molly MA on 10/19/2009 at 07:51:46    [facility]  Division of Digestive Diseases        SURGERY / PROCEDURE SCHEDULE SHEET     Requested Date: 11/03/2009    Requested Time: 7:30 AM    Length of Surgery: 30 minutes      Physician: Normal H. Cassell Smiles MD    Facility: [facility]    Type of patient: Internal Referral    Anesthesia Type: IV Sedation    Medications:   LISINOPRIL-HYDROCHLOROTHIAZIDE 20-25 MG  TABS (LISINOPRIL-HYDROCHLOROTHIAZIDE) Take one tablet by mouth daily for blood pressure  SIMVASTATIN 20 MG TABS (SIMVASTATIN) Take one tablet by mouth daily for cholesterol  FLONASE 50 MCG/ACT SUSP (FLUTICASONE PROPIONATE (NASAL)) Take two sprays in each nostril for allergies  VITAMIN C 500 MG TABS (ASCORBIC ACID) Take one tablet by mouth daily    Allergies: No Known Allergies    * Latex Sensitive: No    Procedure:     Procedure: Colonoscopy - Screening    Diagnoses: 3 YR FOLLOW UP        Prep: Co-Lytely    Patient Information:     Name: Stephanie Suarez    DOB: 1929/05/13    SSN: [id number]    Address: [address]  [place], [place]  [address]    Gender: Female    Home phone: [phone]    Work phone: [phone]    IDX #: [id number]    Last Word #: [id number]    Insurance Information:     Primary Insurance: [facility] M/CARE COMP/SECURE HORIZONS    Member ID #: [id number]

## 2009-10-27 LAB — OFFICE VISIT LAB RESULTS
Bilirubin Urine: NEGATIVE
Glucose, UA: NEGATIVE
Ketones, UA: NEGATIVE
Nitrite, UA: NEGATIVE
Protein, UA: NEGATIVE
Specific Gravity, UA: 1.02
Urobilinogen, UA: NORMAL
pH, UA: 6

## 2009-10-27 NOTE — Unmapped (Signed)
Signed by Viviann Spare MD on 10/27/2009 at 00:00:00  sgnt on file      Imported By: Scharlene Corn 10/28/2009 09:18:34    _____________________________________________________________________    External Attachment:    Please see [employer] EMR for this document.

## 2009-10-27 NOTE — Unmapped (Signed)
Signed by Viviann Spare MD on 10/29/2009 at 08:09:16  Patient: Stephanie Suarez  Note: All result statuses are Final unless otherwise noted.    Tests: (1) CULTURE, URINE, ROUTINE (QDL-395)  ! CULTURE, URINE, ROUTINE                              .              CULTURE, URINE, ROUTINE                 MICRO NUMBER:      [id number]        TEST STATUS:       FINAL        SPECIMEN SOURCE:   CLEAN CATCH        SPECIMEN QUALITY:  ADEQUATE        RESULT:            Single organism less than 10,000 CFU/mL isolated.                           These organisms, commonly found on external and                           internal genitalia, are considered colonizers.                           No further testing performed.    Note: An exclamation mark (!) indicates a result that was not dispersed into   the flowsheet.  Document Creation Date: 10/28/2009 6:56 PM  _______________________________________________________________________    (1) Order result status: Final  Collection or observation date-time: 10/27/2009 16:54  Requested date-time:   Receipt date-time: 10/27/2009 16:56  Reported date-time: 10/28/2009 18:00  Referring Physician:    Ordering Physician: Viviann Spare [facility])  Specimen Source: R  Source: [name] Order Number: [id number]  Lab site: [facility] [place]      [address]      [place]  [place]  [address]

## 2009-10-27 NOTE — Unmapped (Signed)
Signed by Viviann Spare MD on 10/27/2009 at 15:21:29      Reason for Visit   Chief Complaint: urinary pressure for a few days    History from: patient    Allergies  No Known Allergies    Medications   LISINOPRIL-HYDROCHLOROTHIAZIDE 20-25 MG  TABS (LISINOPRIL-HYDROCHLOROTHIAZIDE) Take one tablet by mouth daily for blood pressure  SIMVASTATIN 20 MG TABS (SIMVASTATIN) Take one tablet by mouth daily for cholesterol  FLONASE 50 MCG/ACT SUSP (FLUTICASONE PROPIONATE (NASAL)) Take two sprays in each nostril for allergies  VITAMIN C 500 MG TABS (ASCORBIC ACID) Take one tablet by mouth daily        Vital Signs:   Wt: 147 lbs.      Wt chg (lbs): -1  Temperature: 97.8  degrees F  oral  Pulse: 72 (regular)    Patient appears to be in acute distress: no  BP: 120/66  Cuff size: large  Barriers to Care/Communication: None  Urine Dip: Done     Intake recorded by: Sonia Side MA on October 27, 2009 2:38 PM    History of Present Illness   She complains of suprapubic pressure with urination for a week.  She has urinary urgency and frequency.  She denies dysuria or hematuria. She denies fevers or chills.  She has been trying to drink more fluids.    PAST HISTORY  Past Medical History (reviewed - no changes required):  Environmental Allergies, Hyperlipidemia, Hypertension, Colorectal Polyps, Adenoma on colonoscopy August 2008  Surgical History:  No surgical history.  Social History (reviewed - no changes required): Marital Status: divorced,   Children: 4,   Employment Status: retired,   Occupation: Pharmacist, community  Exercise: walks daily,   Alcohol Use: none  Drug Use: none  Tobacco Usage:non-smoker  Jehovah's Witness (no blood products)    Review of Systems  General: Denies fevers, fatigue.   Gastrointestinal: Complains of abdominal pain. Denies nausea.   Genitourinary: Complains of urinary frequency. Denies dysuria, hematuria.       Physical Examination:   BP: 120/  66    Physical Exam- Detail:   General Appearance: well-developed,  well-nourished and in no acute distress.  Skin: No suspicious rashes or lesions.  Respiratory: Respiration un-labored.  Lung fields clear to auscultation.  No wheezing, rales, rhonchi or pleural rub.  Cardiac: S1 and S2 normal.  RRR without murmurs, rubs, gallops.   Vascular: No edema  Abdomen: No masses or tenderness. Bowel sounds active x4 quad.  Liver and spleen are without tenderness or enlargement.  No hernias. No CVA tenderness.      In office Procedures & Tests   Date/Time Received: 10/27/2009    Routine Urinalysis     Physical characteristics   Color: yellow  Appearance: cloudy    Chemical measurements   Glucose (mg/dL): negative  Bilirubin: negative  Ketone (mg/dL): negative  Spec. Gravity: 1.020  Blood: hemolyzed trace  pH: 6  Protein (mg/dL): negative  Urobilinogen (mg/dL): normal  Nitrite: negative  Leukocytes: + small         New Medications:  KEFLEX 500 MG CAPS (CEPHALEXIN) one by mouth two times a day for 7 days      Preventive Maintenance             Prescriptions:  KEFLEX 500 MG CAPS (CEPHALEXIN) one by mouth two times a day for 7 days  #14 x 0   Entered and Authorized by: Viviann Spare MD   Signed by: Viviann Spare  MD on 10/27/2009   Method used: Print then Give to Patient   RxID: [id number]      Assessment and Plan   UTI-Increase fluids, add cranberry juice to inhibit bacterial moility.   Treat with Keflex.  Follow-up if no improvement or worsening of symptoms.     Medications   New Prescriptions/Refills:  KEFLEX 500 MG CAPS (CEPHALEXIN) one by mouth two times a day for 7 days  #14 x 0, 10/27/2009, Viviann Spare MD    Today's Orders   UA Dipstick (Office) [CPT-81002]  Urine Culture & Sensitivity [CPT-87086]  99213 - Ofc Vst, Est Level III [place]                ]

## 2009-10-27 NOTE — Unmapped (Signed)
Signed by Viviann Spare MD on 10/27/2009 at 00:00:00  Disclosure      Imported By: Scharlene Corn 10/28/2009 09:14:41    _____________________________________________________________________    External Attachment:    Please see [employer] EMR for this document.

## 2009-10-27 NOTE — Unmapped (Signed)
Signed by Viviann Spare MD on 10/27/2009 at 00:00:00  Privacy Notice      Imported By: Scharlene Corn 10/28/2009 09:18:46    _____________________________________________________________________    External Attachment:    Please see [employer] EMR for this document.

## 2009-11-03 NOTE — Unmapped (Signed)
Signed by Stefan Church MD on 11/03/2009 at 00:00:00  Colonoscopy      Imported By: Maryellen Pile 11/03/2009 14:40:53    _____________________________________________________________________    External Attachment:    Please see [employer] EMR for this document.

## 2009-11-05 NOTE — Unmapped (Signed)
Signed by Viviann Spare MD on 11/05/2009 at 00:00:00  Note from Referring Physician      Imported By: Scharlene Corn 11/15/2009 11:23:51    _____________________________________________________________________    External Attachment:    Please see [employer] EMR for this document.

## 2009-11-12 NOTE — ED Provider Notes (Unsigned)
PATIENT NAME:                 PA #:            MR #Amber Palmer, Amber Palmer                   [id number]       [id number]            EMERGENCY ROOM PHYSICIAN:                ADM DATE:                    Melrose Nakayama, MD                      11/12/2009                   DATE OF BIRTH:   AGE:           PATIENT TYPE:     RM #:              October 14, 1929       80             ERK                                     DICTATED BY:  Lovenia Kim, PA-C dictating for Jerl Santos, MD.      CHIEF COMPLAINT:  Left breast discomfort.      HISTORY OF PRESENT ILLNESS:  This is a 76 year old female who presents to the  ER complaining of left breast discomfort, which began this morning.  Denies  any injury, any shortness of breath, any difficulty breathing, any chest  pain.  She says that at times it is a pressure-like, throbbing pain, but she  denies any current pain.  She states she has had this for maybe the past  couple of months intermittently, and it just began today. She has not  followed up with her primary care doctor about this.  She denies any recent  cough.      PAST MEDICAL HISTORY:  Hypertension, hypercholesterolemia.      CURRENT MEDICATIONS:   1.  Lisinopril.   2.  Cholesterol medication.      ALLERGIES: No known drug allergies.      PAST SOCIAL HISTORY:  Denies any alcohol or tobacco use.      REVIEW OF SYSTEMS  GENERAL:  Patient denies recent history of fever, chills, fatigue, weakness  or weight loss.  EYES:  Patient denies blurred vision, loss of peripheral or central vision or  diplopia.  Patient denies eye pain, redness, itching or drainage.  EARS:  _____.   NOSE:  Patient denies nasal congestion or rhinorrhea.  No recent epistaxis.  OROPHARYNX:  Patient denies lesions in the mouth.  No painful or difficulty  swallowing.  CARDIOVASCULAR:   Complains of left breast pain.  Denies any dyspnea on  exertion. RESPIRATORY:  Patient denies shortness of breath, wheezing, cough  or pleuritic  pain.  GASTROINTESTINAL:  Denies nausea or vomiting.   SKIN:  Denies any swelling, bruising, erythema or redness.      PHYSICIAL EXAM  VITAL SIGNS:  BP is 143/78, heart rate 80, respirations 18, O2 saturation 99%  on room air, temperature 97.9.   GENERAL:  Well developed, well nourished.  Alert and oriented x 3, in no  apparent distress.  CARDIOVASCULAR:  Regular rate and rhythm.  Normal S1, S2 sounds.  No murmurs,  rubs or gallops.  Peripheral pulses are symmetric and strong.  No lower  extremity edema.  RESPIRATORY:  Respirations are visibly unlabored and regular.  Breath sounds  are equal.  Lungs are clear to auscultation bilaterally without wheezing,  rales or rhonchi.  FEMALE GENITOURINARY:  Exam was performed.    BREASTS:  Left breast exam was performed, at which point there was no breast  nodule felt, no breast lump palpated.  She does have a little bit of anterior  rib tenderness with palpation.   NEUROLOGIC: The patient is alert and oriented x3. Cranial nerves II-XII are  grossly intact.  Gait was observed and was normal.  SKIN:  No rash.      EMERGENCY ROOM COURSE:  The emergency room course of action was to evaluate  the patient for her left breast discomfort.  I saw no obvious breast mass or  abscess.  We did perform an EKG and chest x-ray due to her age, at which  point chest x-ray came back negative.  EKG showed a normal sinus rhythm with  a rate of 62, no acute ST changes.  This was evaluated by both Dr. Kristen Cardinal and  myself. I believe that more than likely it is just costochondritis;  therefore, I will go ahead and give her a dose of Decadron here and then have  her follow up with her primary care doctor.  I want her to alternate between  ice and heat.  At this time, her vital signs are stable, and she does not  appear in any respiratory distress, resting comfortably in the exam room.  I  think she is stable for discharge.      IMPRESSION/DIAGNOSIS:  Costochondritis.      PLAN:  Discharge the patient  home after giving her a dose of Decadron.  I  will have her alternate between ice and heat and follow up with her primary  care doctor.  Dr. Kristen Cardinal did have face-to-face time with the patient and does  agree with the assessment and plan.                                               Jerl Santos, MD     [id number]  DD: 11/12/2009 22:04   DT: 11/13/2009 18:10   Job #: [id number]  CC:

## 2010-02-24 NOTE — Unmapped (Signed)
Signed by Elizabeth Palau on 02/24/2010 at 13:29:10    Prescriptions:  SIMVASTATIN 20 MG TABS (SIMVASTATIN) Take one tablet by mouth daily for cholesterol  #90 x 0   Entered by: Elizabeth Palau   Authorized by: Viviann Spare MD   Signed by: Elizabeth Palau on 02/24/2010   Method used: Telephoned to ...     [facility] (retail)     [id number]     [place], [place]  [address]     Ph: [phone] or [phone]     Fax: [phone]   RxID: [id number]

## 2010-03-02 LAB — COMPREHENSIVE METABOLIC PANEL
A/G Ratio: 1.6 (ref 1.0–2.1)
ALT: 12 units/L (ref 6–40)
AST: 16 units/L (ref 10–35)
Albumin: 4.2 g/dL (ref 3.6–5.1)
Alkaline Phosphatase: 51 units/L (ref 33–130)
BUN/Creatinine Ratio: 21 (ref 6–22)
BUN: 27 mg/dL (ref 7–25)
CO2: 28 mmol/L (ref 21–33)
Calcium: 9.6 mg/dL (ref 8.6–10.2)
Chloride: 107 mmol/L (ref 98–110)
Creatinine: 1.31 mg/dL (ref 0.63–1.22)
GFR MDRD Af Amer: 45 mL/min (ref >=60)
GFR MDRD Non Af Amer: 39 mL/min (ref >=60)
Globulin, Total: 2.7 g/dL (ref 2.2–3.9)
Glucose: 91 mg/dL (ref 65–99)
Potassium: 4.4 mmol/L (ref 3.5–5.3)
Sodium: 142 mmol/L (ref 135–146)
Total Bilirubin: 0.6 mg/dL (ref 0.2–1.2)
Total Protein: 6.9 g/dL (ref 6.2–8.3)

## 2010-03-02 LAB — CBC AND DIFFERENTIAL
Basophils Absolute: 28 10*3/uL (ref 0–200)
Basophils Relative: 0.5 %
Eosinophils Absolute: 101 10*3/uL (ref 15–500)
Eosinophils Relative: 1.8 %
Hematocrit: 36.6 % (ref 35.0–45.0)
Hemoglobin: 12.1 g/dL (ref 11.7–15.5)
Lymphocytes Absolute: 1742 10*3/uL (ref 850–3900)
Lymphocytes Relative: 31.1 %
MCH: 33.9 pg (ref 27.0–33.0)
MCHC: 33.1 g/dL (ref 32.0–36.0)
MCV: 102.3 fL (ref 80.0–100.0)
Monocytes Absolute: 498 10*3/uL (ref 200–950)
Monocytes Relative: 8.9 %
Neutrophils Absolute: 3231 10*3/uL (ref 1500–7800)
Neutrophils Relative: 57.7 %
Platelets: 100 10*3/uL (ref 140–400)
RBC: 3.58 10*6/uL (ref 3.80–5.10)
RDW: 13.7 % (ref 11.0–15.0)
WBC: 5.6 10*3/uL (ref 3.8–10.8)

## 2010-03-02 LAB — LIPID PANEL
Chol/HDL Ratio: 2.6 (ref <=5.0)
Cholesterol, Total: 208 mg/dL (ref 125–200)
HDL: 79 mg/dL (ref >=46)
LDL Cholesterol: 120 mg/dL (ref <130)
Triglycerides: 45 mg/dL (ref <150)

## 2010-03-02 NOTE — Unmapped (Signed)
Signed by Viviann Spare MD on 03/03/2010 at 09:45:52  Patient: Stephanie Suarez  Note: All result statuses are Final unless otherwise noted.    Tests: (1) COMPREHENSIVE METABOLIC PANEL (QDL-10231)    GLUCOSE                   91 mg/dL                    16-10                        Fasting reference interval           UREA NITROGEN (BUN)  [H]  27 mg/dL                    9-60    CREATININE           [H]  1.31 mg/dL                  0.63-1.22   eGFR NON-AFR. AMERICAN                         [L]  39 mL/min/1.9m2            > OR = 60   eGFR AFRICAN AMERICAN                         [L]  45 mL/min/1.47m2            > OR = 60   BUN/CREATININE RATIO (calc)                              21                          6-22    SODIUM                    142 mmol/L                  135-146    POTASSIUM                 4.4 mmol/L                  3.5-5.3    CHLORIDE                  107 mmol/L                  98-110    CARBON DIOXIDE            28 mmol/L                   21-33    CALCIUM                   9.6 mg/dL                   4.5-40.9    PROTEIN, TOTAL            6.9 g/dL                    8.1-1.9    ALBUMIN                   4.2 g/dL  3.6-5.1    GLOBULIN (calc)           2.7 g/dL                    1.6-1.0   ALBUMIN/GLOBULIN RATIO (calc)                              1.6                         1.0-2.1    BILIRUBIN, TOTAL          0.6 mg/dL                   9.6-0.4    ALKALINE PHOSPHATASE      51 U/L                      33-130    AST                       16 U/L                      10-35    ALT                       12 U/L                      6-40    Note: An exclamation mark (!) indicates a result that was not dispersed into   the flowsheet.  Document Creation Date: 03/03/2010 2:51 AM  _______________________________________________________________________    (1) Order result status: Final  Collection or observation date-time: 03/02/2010 10:31  Requested date-time:   Receipt date-time: 03/02/2010  10:32  Reported date-time: 03/03/2010 02:00  Referring Physician:    Ordering Physician: Viviann Spare [facility])  Specimen Source: S  Source: [name] Order Number: [id number]  Lab site: [facility] [place]      [address]      [place]  [place]  [address]

## 2010-03-02 NOTE — Unmapped (Signed)
Signed by Viviann Spare MD on 03/02/2010 at 10:20:28      Reason for Visit   Chief Complaint: med check     History from: patient    Allergies  No Known Allergies    Medications   OTC Meds Reviewed  Medication list reviewed during this update. LISINOPRIL-HYDROCHLOROTHIAZIDE 20-25 MG  TABS (LISINOPRIL-HYDROCHLOROTHIAZIDE) Take one tablet by mouth daily for blood pressure  SIMVASTATIN 20 MG TABS (SIMVASTATIN) Take one tablet by mouth daily for cholesterol  FLONASE 50 MCG/ACT SUSP (FLUTICASONE PROPIONATE (NASAL)) Take two sprays in each nostril for allergies  VITAMIN C 500 MG TABS (ASCORBIC ACID) Take one tablet by mouth daily        Vital Signs:   Wt: 148 lbs.      Wt chg (lbs): 1  Pulse: 72 (regular)    Patient appears to be in acute distress: no  BP: 130/66  Cuff size: regular  Smoking Hx: non-smoker  Barriers to Care/Communication: None    Intake recorded by: Sonia Side MA on March 02, 2010 9:14 AM        HPI Multiple Chronic   Condition(s): Allergic Rhinitis, Hyperlipidemia, Hypertension.    Current Status:   All problems reasonably well controlled and stable.  Home BP monitoring no    Compliance with diet: good  Compliance with tx: good  Compliance with exercise: good  Medication Tolerance: good  Compliance Comments:   She walks in the summer, not so much in the winter.    ROS/Symptoms:   Patient denies any cardiovascular symptoms.  Patient denies any respiratory symptoms.  Patient denies any GI symptoms.  Patient complains of the following musculoskeletal symptoms: Occasional leg cramps  Patient denies the following ENT symptoms: Allergies well controlled.    PAST HISTORY  Past Medical History (reviewed - no changes required):  Environmental Allergies, Hyperlipidemia, Hypertension, Colorectal Polyps, Adenoma on colonoscopy August 2008  Surgical History:  No surgical history.  Family History (reviewed - no changes required): Son - MI age 44  Mother - peripartum death   Father - Prostate CA  deceased  Daughter - suicide  Son - schizophrenia, HTN, sleep apnea  Social History (reviewed - no changes required): Marital Status: divorced,   Children: 4,   Employment Status: retired,   Occupation: Pharmacist, community  Patient Lives at: Lives with son,   Exercise: walks daily,   Alcohol Use: none  Drug Use: none  Tobacco Usage:non-smoker  Jehovah's Witness (no blood products)    Review of Systems  Refer to HPI for review of systems documentation.      Physical Examination:   BP: 130/  66    Physical Exam- Detail:   General Appearance: well-developed, well-nourished and in no acute distress.  Skin: No suspicious rashes or lesions.  Respiratory: Respiration un-labored.  Lung fields clear to auscultation.  No wheezing, rales, rhonchi or pleural rub.  Neck: No thyromegaly.  No nodules, masses or tenderness.  Lymphatic: Areas palpated not enlarged:  cervical, supraclavicular.  Cardiac: S1 and S2 normal.  RRR without murmurs, rubs, gallops.    Vascular: No edema  Psychiatric: Judgement and insight are within normal limits.  Alert and oriented x3.  No mood disorders noted, appropriate affect.          Preventive Maintenance     Colonoscopy: Abnormal (11/20/2006 9:53:06 AM)   Colonoscopy Test Date: 11/03/2009 Colonoscopy: Polyp       Influenza Immunization History:     Influenza # 1:  Fluvax (02/09/2010)    Pneumovax Immunization History:     Pneumovax # 1:  PNEUMOVAX (08/01/2006)        Prescriptions:  SIMVASTATIN 20 MG TABS (SIMVASTATIN) Take one tablet by mouth daily for cholesterol  #90 x 0   Entered and Authorized by: Viviann Spare MD   Signed by: Viviann Spare MD on 03/02/2010   Method used: Print then Give to Patient   RxID: [id number]  LISINOPRIL-HYDROCHLOROTHIAZIDE 20-25 MG  TABS (LISINOPRIL-HYDROCHLOROTHIAZIDE) Take one tablet by mouth daily for blood pressure  #90 x 3   Entered and Authorized by: Viviann Spare MD   Signed by: Viviann Spare MD on 03/02/2010   Method used: Print then Give to  Patient   RxID: [id number]      Assessment and Plan     Problems   Status of Existing Problems:  Assessed HYPERLIPIDEMIA as comment only - Check fasting labwork - Viviann Spare MD  Assessed HYPERTENSION as comment only - Stable, continue current medication. - Viviann Spare MD    Medications   New Prescriptions/Refills:  SIMVASTATIN 20 MG TABS (SIMVASTATIN) Take one tablet by mouth daily for cholesterol  #90 x 0, 03/02/2010, Viviann Spare MD  LISINOPRIL-HYDROCHLOROTHIAZIDE 20-25 MG  TABS (LISINOPRIL-HYDROCHLOROTHIAZIDE) Take one tablet by mouth daily for blood pressure  #90 x 3, 03/02/2010, Viviann Spare MD    Today's Orders   Mammogram, Screening Bilateral [CPT-76092]  CBC + plat + Diff  (6399) [*CPT-85025]  Comp Metabolic Panel  (METAPNL) (10231) [*CPT-80053]  Lipid Profile   (FATS) (7600) [CPT-80061]  99213 - Ofc Vst, Est Level III [QIO-96295]    Disposition:     Return to clinic for Doctor Visit as needed                 ]

## 2010-03-02 NOTE — Unmapped (Signed)
Signed by Viviann Spare MD on 03/03/2010 at 09:45:52  Patient: Stephanie Suarez  Note: All result statuses are Final unless otherwise noted.    Tests: (1) PLATELET ESTIMATION (QDL-%11018)  ! PLATELET ESTIMATION  [A]  DECREASED                   ADEQUATE            REPORT COMMENT:      FASTING    Note: An exclamation mark (!) indicates a result that was not dispersed into   the flowsheet.  Document Creation Date: 03/03/2010 2:51 AM  _______________________________________________________________________    (1) Order result status: Final  Collection or observation date-time: 03/02/2010 10:31  Requested date-time:   Receipt date-time: 03/02/2010 10:32  Reported date-time: 03/03/2010 02:00  Referring Physician:    Ordering Physician: Viviann Spare [facility])  Specimen Source: B  Source: [name] Order Number: [id number]  Lab site: [facility] [place]      [address]      [place]  [place]  [address]

## 2010-03-02 NOTE — Unmapped (Signed)
Signed by Viviann Spare MD on 03/03/2010 at 09:45:52  Patient: Stephanie Suarez  Note: All result statuses are Final unless otherwise noted.    Tests: (1) CBC (INCLUDES DIFF/PLT) (QDL-6399)   WHITE BLOOD CELL COUNT                              5.6 Thousand/uL             3.8-10.8    RED BLOOD CELL COUNT [L]  3.58 Million/uL             3.80-5.10    HEMOGLOBIN                12.1 g/dL                   86.5-78.4    HEMATOCRIT                36.6 %                      35.0-45.0    MCV                  [H]  102.3 fL                    80.0-100.0    MCH                  [H]  33.9 pg                     27.0-33.0    MCHC                      33.1 g/dL                   69.6-29.5    RDW                       13.7 %                      11.0-15.0    PLATELET COUNT       [L]  100 Thousand/uL             140-400    ABSOLUTE NEUTROPHILS      3231 cells/uL               1500-7800    ABSOLUTE LYMPHOCYTES      1742 cells/uL               [phone]    ABSOLUTE MONOCYTES        498 cells/uL                200-950    ABSOLUTE EOSINOPHILS      101 cells/uL                15-500    ABSOLUTE BASOPHILS        28 cells/uL                 0-200    NEUTROPHILS               57.7 %    LYMPHOCYTES               31.1 %    MONOCYTES  8.9 %    EOSINOPHILS               1.8 %    BASOPHILS                 0.5 %    Note: An exclamation mark (!) indicates a result that was not dispersed into   the flowsheet.  Document Creation Date: 03/03/2010 2:51 AM  _______________________________________________________________________    (1) Order result status: Final  Collection or observation date-time: 03/02/2010 10:31  Requested date-time:   Receipt date-time: 03/02/2010 10:32  Reported date-time: 03/03/2010 02:00  Referring Physician:    Ordering Physician: Viviann Spare [facility])  Specimen Source: B  Source: [name] Order Number: [id number]  Lab site: [facility] [place]      [address]      [place]  [facility]   [address]      -----------------    The following lab values were dispersed to the flowsheet  with no units conversion:      WHITE BLOOD CELL COUNT, 5.6 THOUSAND/UL, (F)  expected units: 10*3/mm3    RED BLOOD CELL COUNT, 3.58 MILLION/UL, (F)  expected units: 10*6/mm3    PLATELET COUNT, 100 THOUSAND/UL, (F)  expected units: 10*3/mm3    ABSOLUTE NEUTROPHILS, 3231 CELLS/UL, (F)  expected units: K/uL    ABSOLUTE LYMPHOCYTES, 1742 CELLS/UL, (F)  expected units: 10*3/mm3    ABSOLUTE MONOCYTES, 498 CELLS/UL, (F)  expected units: 10*3/microliter    ABSOLUTE EOSINOPHILS, 101 CELLS/UL, (F)  expected units: 10*3/mm3    ABSOLUTE BASOPHILS, 28 CELLS/UL, (F)  expected units: K/uL

## 2010-03-02 NOTE — Unmapped (Signed)
Signed by Viviann Spare MD on 03/02/2010 at 00:00:00  no show policy      Imported By: Scharlene Corn 03/02/2010 13:26:56    _____________________________________________________________________    External Attachment:    Please see [employer] EMR for this document.

## 2010-03-02 NOTE — Unmapped (Signed)
Signed by Viviann Spare MD on 03/03/2010 at 09:45:52  Patient: Stephanie Suarez  Note: All result statuses are Final unless otherwise noted.    Tests: (1) LIPID PANEL (QDL-7600)    CHOLESTEROL, TOTAL   [H]  208 mg/dL                   630-160    HDL CHOLESTEROL           79 mg/dL                    > OR = 46    TRIGLYCERIDES             45 mg/dL                    <109   LDL-CHOLESTEROL (calc)                              120 mg/dL                   <323             Desirable range <100 mg/dL for patients with CHD or      diabetes and <70 mg/dL for diabetic patients with      known heart disease.          CHOL/HDLC RATIO (calc)                              2.6                         < OR = 5.0    Note: An exclamation mark (!) indicates a result that was not dispersed into   the flowsheet.  Document Creation Date: 03/03/2010 2:51 AM  _______________________________________________________________________    (1) Order result status: Final  Collection or observation date-time: 03/02/2010 10:31  Requested date-time:   Receipt date-time: 03/02/2010 10:32  Reported date-time: 03/03/2010 02:00  Referring Physician:    Ordering Physician: Viviann Spare [facility])  Specimen Source: S  Source: [name] Order Number: [id number]  Lab site: [facility] [place]      [address]      [place]  [place]  [address]

## 2010-03-03 NOTE — Unmapped (Signed)
Signed by Elizabeth Palau on 03/03/2010 at 09:58:36    Labs/Tests Reviewed  Hemoglobin: 12.1 (03/02/2010 10:31:00 AM)  Hematocrit: 36.6 (03/02/2010 10:31:00 AM)  MCV: 102.3 (03/02/2010 10:31:00 AM)  MCH: 33.9 (03/02/2010 10:31:00 AM)  WBC: 5.6 THOUSAND/UL (03/02/2010 10:31:00 AM)  Platelets: 100 THOUSAND/UL (03/02/2010 10:31:00 AM)  HgbA1C: 5.8 (02/11/2009 9:00:00 AM)  Serum Glucose: 91 (03/02/2010 10:31:00 AM)  Total Protein: 6.9 (03/02/2010 10:31:00 AM)  Albumin: 4.2 (03/02/2010 10:31:00 AM)  AST: 16 (03/02/2010 10:31:00 AM)  ALT: 12 (03/02/2010 10:31:00 AM)  Alkaline Phosphatase: 51 (03/02/2010 10:31:00 AM)  Total Bilirubin: 0.6 (03/02/2010 10:31:00 AM)  Direct Bilirubin: 0.4 (03/11/2008 10:57:00 AM)  Serum Cholesterol 208 (03/02/2010 10:31:00 AM)  LDL: 120 (03/02/2010 10:31:00 AM)  HDL: 79 (03/02/2010 10:31:00 AM)  Triglyceride: 45 (03/02/2010 10:31:00 AM)  Sodium: 142 (03/02/2010 10:31:00 AM)  Potassium: 4.4 (03/02/2010 10:31:00 AM)  Chloride: 107 (03/02/2010 10:31:00 AM)  BUN: 27 (03/02/2010 10:31:00 AM)  Creatinine: 1.31 (03/02/2010 10:31:00 AM)  Serum Calcium: 9.6 (03/02/2010 10:31:00 AM)  Microalbumin/Cr: 11 MCG/MG CREAT (02/11/2009 9:00:00 AM)  Pap smear: Neg (08/01/2006 12:00:00 AM)       Follow-up for Test Results:   Comments: Notify pt. that I would like her to come in to discuss her labwork within the next few weeks.  Her platelets are low.  I would like her to have have vitamin B12 and folic acid level checked before I see her. Order added.  Follow-up by: Viviann Spare MD,  March 03, 2010 9:51 AM    Additional Follow-up for Test Results:   Action Taken: Phone Call Completed- patient informed of results  Additional Follow-up by: Elizabeth Palau,  March 03, 2010 9:58 AM

## 2010-03-21 LAB — VITAMIN B12 AND FOLATE
Folic Acid: 16.4 ng/mL
Vitamin B-12: 637 pg/mL (ref 200–1100)

## 2010-03-21 NOTE — Unmapped (Signed)
Signed by Viviann Spare MD on 03/22/2010 at 21:53:29  Patient: Stephanie Suarez  Note: All result statuses are Final unless otherwise noted.    Tests: (1) VITAMIN B12/FOLATE, SERUM PANEL (QDL-7065)    VITAMIN B12               637 pg/mL                   [id number]    FOLATE, SERUM             16.4 ng/mL                                 Reference Range                                 Low:           <3.4                                 Borderline:    3.4-5.4                                 Normal:        >5.4                   REPORT COMMENT:      FASTING    Note: An exclamation mark (!) indicates a result that was not dispersed into   the flowsheet.  Document Creation Date: 03/22/2010 10:59 AM  _______________________________________________________________________    (1) Order result status: Final  Collection or observation date-time: 03/21/2010 08:48  Requested date-time:   Receipt date-time: 03/21/2010 08:48  Reported date-time: 03/22/2010 10:00  Referring Physician:    Ordering Physician: Viviann Spare [facility])  Specimen Source: S  Source: [name] Order Number: [id number]  Lab site: [facility] [place]      [address]      [place]  [place]  [address]

## 2010-03-22 NOTE — Unmapped (Signed)
Signed by Elizabeth Palau on 03/23/2010 at 08:47:48              Viviann Spare, M.D.  [address]  [place], [place] [address]  Phone [phone]  Fax [phone]     March 23, 2010      [facility] [place]  [address]    [place], [place] [address]                                                                                           RE:  TEST RESULTS   (Stephanie Suarez--1929/05/31)         Dear Ms. Olinde:      The following is an interpretation of your most recent tests.  Please take note of any instructions provided.          Other lab results: Vitamin B12-normal  Folic Acid-normal           Sincerely,         Viviann Spare, M.D.

## 2010-03-23 LAB — CBC AND DIFFERENTIAL
Basophils Absolute: 37 10*3/uL (ref 0–200)
Basophils Relative: 0.6 %
Eosinophils Absolute: 61 10*3/uL (ref 15–500)
Eosinophils Relative: 1 %
Hematocrit: 35.9 % (ref 35.0–45.0)
Hemoglobin: 12 g/dL (ref 11.7–15.5)
Lymphocytes Absolute: 1488 10*3/uL (ref 850–3900)
Lymphocytes Relative: 24.4 %
MCH: 33.5 pg (ref 27.0–33.0)
MCHC: 33.5 g/dL (ref 32.0–36.0)
MCV: 100.3 fL (ref 80.0–100.0)
Monocytes Absolute: 458 10*3/uL (ref 200–950)
Monocytes Relative: 7.5 %
Neutrophils Absolute: 4057 10*3/uL (ref 1500–7800)
Neutrophils Relative: 66.5 %
Platelets: 119 10*3/uL (ref 140–400)
RBC: 3.58 10*6/uL (ref 3.80–5.10)
RDW: 12.7 % (ref 11.0–15.0)
WBC: 6.1 10*3/uL (ref 3.8–10.8)

## 2010-03-23 LAB — BASIC METABOLIC PANEL
BUN/Creatinine Ratio: 26 (ref 6–22)
BUN: 33 mg/dL (ref 7–25)
CO2: 27 mmol/L (ref 21–33)
Calcium: 9.9 mg/dL (ref 8.6–10.2)
Chloride: 104 mmol/L (ref 98–110)
Creatinine: 1.28 mg/dL (ref 0.63–1.22)
GFR MDRD Af Amer: 46 mL/min (ref >=60)
GFR MDRD Non Af Amer: 40 mL/min (ref >=60)
Glucose: 86 mg/dL (ref 65–99)
Potassium: 4.1 mmol/L (ref 3.5–5.3)
Sodium: 142 mmol/L (ref 135–146)

## 2010-03-23 NOTE — Unmapped (Signed)
Signed by Viviann Spare MD on 03/23/2010 at 15:02:16      Reason for Visit   Chief Complaint: fup test results    History from: patient    Allergies  No Known Allergies    Medications   OTC Meds Reviewed  Medication list reviewed during this update. LISINOPRIL-HYDROCHLOROTHIAZIDE 20-25 MG  TABS (LISINOPRIL-HYDROCHLOROTHIAZIDE) Take one tablet by mouth daily for blood pressure  SIMVASTATIN 20 MG TABS (SIMVASTATIN) Take one tablet by mouth daily for cholesterol  FLONASE 50 MCG/ACT SUSP (FLUTICASONE PROPIONATE (NASAL)) Take two sprays in each nostril for allergies  VITAMIN C 500 MG TABS (ASCORBIC ACID) Take one tablet by mouth daily        Vital Signs:   Wt: 148 lbs.      Wt chg (lbs): 0  Pulse: 76 (regular)    Patient appears to be in acute distress: no  BP: 112/64  Cuff size: regular  Smoking Hx: non-smoker  Barriers to Care/Communication: None    Intake recorded by: Sonia Side MA on March 23, 2010 2:12 PM    History of Present Illness   She is here to review labwork. Her recent labwork revealed low platelets at 100, elevated MCV and elevated creatinine and GFR.    I had her get folic acid and B12 which were normal.  She denies having previous problems with bleeding or bruising in the past.    PAST HISTORY  Past Medical History (reviewed - no changes required):  Environmental Allergies, Hyperlipidemia, Hypertension, Colorectal Polyps, Adenoma on colonoscopy August 2008  Family History (reviewed - no changes required): Son - MI age 8  Mother - peripartum death   Father - Prostate CA deceased  Daughter - suicide  Son - schizophrenia, HTN, sleep apnea  Social History (reviewed - no changes required): Marital Status: divorced,   Children: 4,   Employment Status: retired,   Occupation: Pharmacist, community  Patient Lives at: Lives with son,   Exercise: walks daily,   Alcohol Use: none  Drug Use: none  Tobacco Usage:non-smoker  Jehovah's Witness (no blood products)    Review of Systems  General: Denies fatigue.      Heme/Lymphatic: Denies abnormal bruising, bleeding.       Physical Examination:   BP: 112/  64    Physical Exam- Detail:   General Appearance: well-developed, well-nourished and in no acute distress.  Skin: No suspicious rashes or lesions.           New Problems:  RENAL INSUFFICIENCY (ICD-588.9)        Preventive Maintenance               Assessment and Plan   1. Thrombocytopenia/macrocytosis-recheck CBC  2. Renal inuff-recheck renal panel.    Problems New Problems:  Dx of RENAL INSUFFICIENCY (ICD-588.9)  Onset: 03/23/2010  Today's Orders   CBC + plat + Diff  (6399) [*CPT-85025]  Metabolic Panel, Basic (EP1) (10165) [CPT-80048]  99213 - Ofc Vst, Est Level III [place]                ]

## 2010-03-23 NOTE — Unmapped (Signed)
Signed by Viviann Spare MD on 03/24/2010 at 09:31:53  Patient: Stephanie Suarez  Note: All result statuses are Final unless otherwise noted.    Tests: (1) BASIC METABOLIC PANEL (QDL-10165)    GLUCOSE                   86 mg/dL                    25-95                        Fasting reference interval           UREA NITROGEN (BUN)  [H]  33 mg/dL                    6-38    CREATININE           [H]  1.28 mg/dL                  0.63-1.22   eGFR NON-AFR. AMERICAN                         [L]  40 mL/min/1.36m2            > OR = 60   eGFR AFRICAN AMERICAN                         [L]  46 mL/min/1.35m2            > OR = 60   BUN/CREATININE RATIO (calc)                         [H]  26                          6-22    SODIUM                    142 mmol/L                  135-146    POTASSIUM                 4.1 mmol/L                  3.5-5.3    CHLORIDE                  104 mmol/L                  98-110    CARBON DIOXIDE            27 mmol/L                   21-33    CALCIUM                   9.9 mg/dL                   7.5-64.3    Note: An exclamation mark (!) indicates a result that was not dispersed into   the flowsheet.  Document Creation Date: 03/24/2010 1:08 AM  _______________________________________________________________________    (1) Order result status: Final  Collection or observation date-time: 03/23/2010 15:03  Requested date-time:   Receipt date-time: 03/23/2010 15:04  Reported date-time: 03/24/2010 01:00  Referring Physician:    Ordering  Physician: Viviann Spare [facility])  Specimen Source: S  Source: [name] Order Number: [id number]  Lab site: [facility]       [address]      [place]  [place]  [address]

## 2010-03-23 NOTE — Unmapped (Signed)
Signed by Viviann Spare MD on 03/24/2010 at 09:31:53  Patient: Stephanie Suarez  Note: All result statuses are Final unless otherwise noted.    Tests: (1) CBC (INCLUDES DIFF/PLT) (QDL-6399)   WHITE BLOOD CELL COUNT                              6.1 Thousand/uL             3.8-10.8    RED BLOOD CELL COUNT [L]  3.58 Million/uL             3.80-5.10    HEMOGLOBIN                12.0 g/dL                   19.1-47.8    HEMATOCRIT                35.9 %                      35.0-45.0    MCV                  [H]  100.3 fL                    80.0-100.0    MCH                  [H]  33.5 pg                     27.0-33.0    MCHC                      33.5 g/dL                   29.5-62.1    RDW                       12.7 %                      11.0-15.0    PLATELET COUNT       [L]  119 Thousand/uL             140-400    ABSOLUTE NEUTROPHILS      4057 cells/uL               1500-7800    ABSOLUTE LYMPHOCYTES      1488 cells/uL               [phone]    ABSOLUTE MONOCYTES        458 cells/uL                200-950    ABSOLUTE EOSINOPHILS      61 cells/uL                 15-500    ABSOLUTE BASOPHILS        37 cells/uL                 0-200    NEUTROPHILS               66.5 %    LYMPHOCYTES               24.4 %    MONOCYTES  7.5 %    EOSINOPHILS               1.0 %    BASOPHILS                 0.6 %    Note: An exclamation mark (!) indicates a result that was not dispersed into   the flowsheet.  Document Creation Date: 03/24/2010 1:08 AM  _______________________________________________________________________    (1) Order result status: Final  Collection or observation date-time: 03/23/2010 15:03  Requested date-time:   Receipt date-time: 03/23/2010 15:04  Reported date-time: 03/24/2010 01:00  Referring Physician:    Ordering Physician: Viviann Spare [facility])  Specimen Source: B  Source: [name] Order Number: [id number] [phone] [id number]  Lab site: [facility] [place]      [address]      [place]  [facility]   [address]      -----------------    The following lab values were dispersed to the flowsheet  with no units conversion:      WHITE BLOOD CELL COUNT, 6.1 THOUSAND/UL, (F)  expected units: 10*3/mm3    RED BLOOD CELL COUNT, 3.58 MILLION/UL, (F)  expected units: 10*6/mm3    PLATELET COUNT, 119 THOUSAND/UL, (F)  expected units: 10*3/mm3    ABSOLUTE NEUTROPHILS, 4057 CELLS/UL, (F)  expected units: K/uL    ABSOLUTE LYMPHOCYTES, 1488 CELLS/UL, (F)  expected units: 10*3/mm3    ABSOLUTE MONOCYTES, 458 CELLS/UL, (F)  expected units: 10*3/microliter    ABSOLUTE EOSINOPHILS, 61 CELLS/UL, (F)  expected units: 10*3/mm3    ABSOLUTE BASOPHILS, 37 CELLS/UL, (F)  expected units: K/uL

## 2010-03-24 NOTE — Unmapped (Signed)
Signed by Thresa Ross MA on 03/28/2010 at 84:69:62              Viviann Spare, M.D.  [address]  [place], [place] [address]  Phone [phone]  Fax [phone]     March 28, 2010      [facility] [place]  [address]    [place], [place] [address]                                                                                           RE:  TEST RESULTS   (Stephanie Suarez--July 23, 1929)         Dear [name]:      The following is an interpretation of your most recent tests.  Please take note of any instructions provided.   Electrolyte studies:  Normal         Glucose test normal:  86   Kidney function studies:  Stable - Stable, plan to recheck kidney function and urine in 3 months     CBC results (Blood Counts):  Stable - Platelets are improved                  Sincerely,         Viviann Spare, M.D.

## 2010-03-29 NOTE — Unmapped (Signed)
Signed by Elizabeth Palau on 03/29/2010 at 12:43:23    PHONE NOTE    Reason for Call: speak with nurse.   PHONE NOTE  Pharmacy Name: [facility]   Pharmacy Phone Number: [phone]  Pharmacy Fax Number: [phone]  Call for: Stephanie Suarez  REF# [id number]    Reason for Call: , RX REFILL  SIMVASTATIN 20 MG 1 at bedtime     Initial call taken by: Hardie Lora MA,  March 29, 2010 12:06 PM      FOLLOW UP  Script printed.  Follow-up by:  Viviann Spare MD,  March 29, 2010 12:41 PM    FOLLOW UP  [facility]  patient advised, Rx completed  Follow-up by:  Elizabeth Palau,  March 29, 2010 12:43 PM    Prescriptions:  SIMVASTATIN 20 MG TABS (SIMVASTATIN) Take one tablet by mouth daily for cholesterol  #90 x 3   Entered and Authorized by: Viviann Spare MD   Signed by: Viviann Spare MD on 03/29/2010   Method used: Print then Give to Patient   RxID: [id number]

## 2010-04-12 NOTE — Unmapped (Signed)
Signed by Elizabeth Palau on 04/13/2010 at 08:10:53    PHONE NOTE  Call back at Home Phone: [phone]  Caller: patient  Department: Family Medicine  Call for: DR LS    Reason for Call: refill medication. NEED REFILL FAXED FOR LISINOPRIL HCTZ  TAB 20 -25MG  1 daily FAX TO [phone]      Initial call taken by: Elizabeth Palau,  April 12, 2010 2:41 PM      FOLLOW UP  Script printed to fax.  Follow-up by:  Viviann Spare MD,  April 12, 2010 4:18 PM    FOLLOW UP  rx faxed  Rx completed  Follow-up by:  Elizabeth Palau,  April 13, 2010 8:10 AM    Prescriptions:  LISINOPRIL-HYDROCHLOROTHIAZIDE 20-25 MG  TABS (LISINOPRIL-HYDROCHLOROTHIAZIDE) Take one tablet by mouth daily for blood pressure  #90 x 3   Entered and Authorized by: Viviann Spare MD   Signed by: Viviann Spare MD on 04/13/2010   Method used: Print then Give to Patient   RxID: [id number]

## 2010-06-27 LAB — BASIC METABOLIC PANEL
BUN/Creatinine Ratio: 21 (ref 6–22)
BUN: 27 mg/dL (ref 7–25)
CO2: 28 mmol/L (ref 21–33)
Calcium: 10.2 mg/dL (ref 8.6–10.4)
Chloride: 103 mmol/L (ref 98–110)
Creatinine: 1.29 mg/dL (ref 0.60–0.93)
GFR MDRD Af Amer: 46 mL/min (ref >=60)
GFR MDRD Non Af Amer: 39 mL/min (ref >=60)
Glucose: 99 mg/dL (ref 65–99)
Potassium: 4 mmol/L (ref 3.5–5.3)
Sodium: 140 mmol/L (ref 135–146)

## 2010-06-27 NOTE — Unmapped (Signed)
Signed by Viviann Spare MD on 06/27/2010 at 08:27:12      Reason for Visit   Chief Complaint: check up    History from: patient    Allergies  No Known Allergies    Medications   OTC Meds Reviewed  LISINOPRIL-HYDROCHLOROTHIAZIDE 20-25 MG  TABS (LISINOPRIL-HYDROCHLOROTHIAZIDE) Take one tablet by mouth daily for blood pressure  SIMVASTATIN 20 MG TABS (SIMVASTATIN) Take one tablet by mouth daily for cholesterol  FLONASE 50 MCG/ACT SUSP (FLUTICASONE PROPIONATE (NASAL)) Take two sprays in each nostril for allergies  VITAMIN C 500 MG TABS (ASCORBIC ACID) Take one tablet by mouth daily  CALCIUM-MAGNESIUM-ZINC  TABS (CALCIUM-MAGNESIUM-ZINC TABS) 1 by mouth daily  VITAMIN B COMPLEX  TABS (B COMPLEX VITAMINS) 1 by mouth daily        Vital Signs:   Wt: 147 lbs.      Wt chg (lbs): -1  Pulse: 84 (regular)    Patient appears to be in acute distress: no  BP: 124/68  Cuff size: regular  Smoking Hx: non-smoker  Barriers to Care/Communication: None    Intake recorded by: Sonia Side MA on June 27, 2010 8:09 AM    History of Present Illness   She is here to follow-up on HTN. Her BP has been on control when she has checked it.  She is eating a good diet and is walking regularly.  She is tolerating her  medication.  She denies chest pain, SOB.    She has had a cough from her allergies.  She hasn't started her Flonase yet this season.      She has Hyperlipidemia and takes Simvastatin.  She has leg muscle cramps every once in a while.      PAST HISTORY  Past Medical History (reviewed - no changes required):  Environmental Allergies, Hyperlipidemia, Hypertension, Colorectal Polyps, Adenoma on colonoscopy August 2008  Surgical History:  No surgical history.  Family History (reviewed - no changes required): Son - MI age 75  Mother - peripartum death   Father - Prostate CA deceased  Daughter - suicide  Son - schizophrenia, HTN, sleep apnea  Sister - Alzheimer's Dementia  Social History (reviewed - no changes required): Marital Status:  divorced,   Children: 4,   Employment Status: retired,   Occupation: Pharmacist, community  Patient Lives at: Lives with son,   Exercise: walks daily,   Alcohol Use: none  Drug Use: none  Tobacco Usage:non-smoker  Jehovah's Witness (no blood products)        Physical Examination:   BP: 124/  68    Physical Exam- Detail:   General Appearance: well-developed, well-nourished and in no acute distress.  Skin: No suspicious rashes or lesions.  Respiratory: Respiration un-labored.  Lung fields clear to auscultation.  No wheezing, rales, rhonchi or pleural rub.  Neck: No thyromegaly.  No nodules, masses or tenderness.  Cardiac: S1 and S2 normal.  RRR without murmurs, rubs, gallops.   Vascular: No edema  Psychiatric: Judgement and insight are within normal limits.  Alert and oriented x3.  No mood disorders noted, appropriate affect.  Musculoskeletal: Gait coordinated and smooth.  Digits are without clubbing or cyanosis.      Labs/Tests Reviewed  Serum Cholesterol 208 (03/02/2010 10:31:00 AM)  LDL: 120 (03/02/2010 10:31:00 AM)  HDL: 79 (03/02/2010 10:31:00 AM)  Triglyceride: 45 (03/02/2010 10:31:00 AM)  Sodium: 142 (03/23/2010 3:03:00 PM)  Potassium: 4.1 (03/23/2010 3:03:00 PM)  Chloride: 104 (03/23/2010 3:03:00 PM)  BUN: 33 (03/23/2010 3:03:00 PM)  Creatinine: 1.28 (03/23/2010 3:03:00 PM)  Serum Calcium: 9.9 (03/23/2010 3:03:00 PM)  Microalbumin/Cr: 11 MCG/MG CREAT (02/11/2009 9:00:00 AM)       New Medications:  CALCIUM-MAGNESIUM-ZINC  TABS (CALCIUM-MAGNESIUM-ZINC TABS) 1 by mouth daily  VITAMIN B COMPLEX  TABS (B COMPLEX VITAMINS) 1 by mouth daily        Preventive Maintenance               Assessment and Plan     Problems   Status of Existing Problems:  Assessed RENAL INSUFFICIENCY as comment only - Check renal panel - Viviann Spare MD  Assessed HYPERTENSION as comment only - Stable, continue current medication - Viviann Spare MD  Assessed RHINOSINUSITIS, ALLERGIC, CHRONIC as comment only - Recommended starting Flonase now. Viviann Spare MD    Medications   Today's Orders   Metabolic Panel, Basic (EP1) (10165) [CPT-80048]  99214 - Ofc Vst, Est Level IV [CPT-99214]    Disposition:     Return to clinic for Doctor Visit in 3 month(s)                   ]

## 2010-06-27 NOTE — Unmapped (Signed)
Signed by Viviann Spare MD on 06/28/2010 at 15:11:45  Patient: Stephanie Suarez  Note: All result statuses are Final unless otherwise noted.    Tests: (1) BASIC METABOLIC PANEL (QDL-10165)    GLUCOSE                   99 mg/dL                    25-36                        Fasting reference interval           UREA NITROGEN (BUN)  [H]  27 mg/dL                    6-44    CREATININE           [H]  1.29 mg/dL                  0.60-0.93      For patients >64 years of age, the reference limit      for Creatinine is approximately 13% higher for people      identified as African-American.          eGFR NON-AFR. AMERICAN                         [L]  39 mL/min/1.82m2            > OR = 60   eGFR AFRICAN AMERICAN                         [L]  46 mL/min/1.44m2            > OR = 60   BUN/CREATININE RATIO (calc)                              21                          6-22    SODIUM                    140 mmol/L                  135-146    POTASSIUM                 4.0 mmol/L                  3.5-5.3    CHLORIDE                  103 mmol/L                  98-110    CARBON DIOXIDE            28 mmol/L                   21-33    CALCIUM                   10.2 mg/dL                  0.3-47.4    Note: An exclamation mark (!) indicates a result that was not dispersed into   the flowsheet.  Document Creation Date: 06/28/2010 4:38 AM  _______________________________________________________________________    (1) Order result status: Final  Collection or observation date-time: 06/27/2010 08:26  Requested date-time:   Receipt date-time: 06/27/2010 08:27  Reported date-time: 06/28/2010 04:00  Referring Physician:    Ordering Physician: Viviann Spare [facility])  Specimen Source: S  Source: [name] Order Number: [id number]  Lab site: [facility] [place]      [address]      [place]  [place]  [address]

## 2010-06-28 NOTE — Unmapped (Signed)
Signed by Elizabeth Palau on 06/28/2010 at 15:16:31              Viviann Spare, M.D.  [address]  [place], [place] [address]  Phone [phone]  Fax [phone]     June 28, 2010      [facility]  [address]    [place], [place] [address]                                                                                           RE:  TEST RESULTS   (Phylisha Hauter--March 07, 1930)         Dear Ms. Putzier:      The following is an interpretation of your most recent tests.  Please take note of any instructions provided.   Electrolyte studies:  Normal, Blood sugar normal         Glucose test normal:  99   Kidney function studies:  Stable           Additional Comments: Your lab looks fine.           Sincerely,         Viviann Spare, M.D.

## 2010-07-11 NOTE — Unmapped (Signed)
Signed by Vista Lawman on 07/11/2010 at 11:00:21              July 11, 2010      [facility]  [address]  [place], [place]  [address]    [facility]  [address]  [place], [place] [address]  [phone]      Dear  Ms. Tamarah Vidrio,  Your Provider had previsously given you an order for testing to be done or for a consultation with another Provider. During a review of your records it appears that you have not had this completed.  At your next visit please let us know if you had the test performed or if you have chosen not to have it done.  If you have had the testing completed please disregard this letter.  (Mammogram)    [facility]

## 2010-07-14 NOTE — Unmapped (Signed)
Signed by Viviann Spare MD on 07/14/2010 at 09:15:26    PHONE NOTE  Call back at Home Phone: [phone]  Caller: patient    Reason for Call: FYI PATIENT HAVING THE MAMMOGRAM IN JULY       Initial call taken by: Elizabeth Palau,  July 14, 2010 8:33 AM      FOLLOW UP  Noted.  Follow-up by:  Viviann Spare MD,  July 14, 2010 9:15 AM

## 2010-11-16 ENCOUNTER — Ambulatory Visit: Admit: 2010-11-16 | Payer: Medicare (Managed Care)

## 2010-11-16 DIAGNOSIS — I10 Essential (primary) hypertension: Secondary | ICD-10-CM

## 2010-11-16 NOTE — Unmapped (Signed)
NSR, no ischemia. EKG scanned in.

## 2010-11-16 NOTE — Unmapped (Signed)
Subjective  HPI:   Patient ID: Stephanie Suarez is a 77 y.o. female.    Chief Complaint: HTN  HPI     She is here to f/u on HTN.  She had an episode of chest pain, 3 weeks ago.  She was getting excited and the pain occurred.  The pain lasted about 10 minutes, felt sharp, no associated SOB, nausea, paresthesias.  She wasn't exerting herself at the time. She can walk without problems.      Her left ear feels plugged, her hearing is less.    Past Medical History   Diagnosis Date   ??? Environmental allergies    ??? Hyperlipidemia    ??? Hypertension    ??? Adenoma 08/08     on colonoscopy august 2008     No past surgical history on file.  Prior to Admission medications    Medication Sig Start Date End Date Taking? Authorizing Provider   ascorbic acid (VITAMIN C) 500 MG tablet Take 500 mg by mouth daily.   02/11/09  Yes Historical Provider, MD   fluticasone (FLONASE) 50 mcg/actuation nasal spray 2 sprays by Each Nare route.   08/28/08  Yes Historical Provider, MD   lisinopril-hydrochlorothiazide (PRINZIDE,ZESTORETIC) 20-25 mg per tablet Take 1 tablet by mouth daily.   03/07/09  Yes Historical Provider, MD   simvastatin (ZOCOR) 20 MG tablet Take 20 mg by mouth daily. For cholesterol  02/11/09  Yes Historical Provider, MD   vit b complex w-b 12 (VITAMIN B COMPLEX) tablet Take 1 tablet by mouth daily.     Yes Historical Provider, MD   calcium-magnesium-zinc Tab Take 1 tablet by mouth daily.      Historical Provider, MD     No Known Allergies  Social history- reports that she has never smoked. She does not have any smokeless tobacco history on file. She reports that she does not drink alcohol or use illicit drugs.         ROS:   Review of Systems   Constitutional: Negative for fever and fatigue.   Respiratory: Negative for cough, chest tightness and shortness of breath.    Cardiovascular: Positive for chest pain. Negative for palpitations and leg swelling.   Musculoskeletal: Negative for myalgias.          Objective:   Physical Exam      Constitutional: She is oriented to person, place, and time. She appears well-developed and well-nourished.   HENT:   Head: Normocephalic.   Right Ear: Hearing normal.   Left Ear: Hearing normal.        Right canal ceruman impaction, removed manually with currette. Normal TM  Left canal-minimal ceruman, removed, normal TM   Neck: Neck supple.   Cardiovascular: Normal rate, regular rhythm and normal heart sounds.    No murmur heard.  Pulmonary/Chest: Effort normal and breath sounds normal. No respiratory distress. She has no wheezes. She has no rales.   Abdominal: Soft. Bowel sounds are normal. She exhibits no mass. There is no hepatosplenomegaly. There is no tenderness. There is no rebound and no guarding.   Musculoskeletal: She exhibits no edema.   Neurological: She is alert and oriented to person, place, and time.   Skin: Skin is warm and dry.   Psychiatric: She has a normal mood and affect. Her behavior is normal. Judgment and thought content normal.         NSR, no ischemia. EKG scanned in.       Filed Vitals:  11/16/10 0943   BP: 110/66   Pulse: 96   Height: 5' 3 (1.6 m)   Weight: 141 lb (63.957 kg)     Body mass index is 24.98 kg/(m^2).  Body surface area is 1.69 meters squared.      Assessment/Plan:     Avia was seen today for hypertension.    Diagnoses and associated orders for this visit:    Hypertension  - stable, no change  Chest pain  - normal EKG, reassurance given.  Impacted cerumen  - removed with currette.  F/u in 6 months.

## 2010-12-19 NOTE — Unmapped (Signed)
PATIENT NOTIFIED FAXED THE ORDER TO [facility]

## 2010-12-19 NOTE — Unmapped (Signed)
Notify pt. That I received her mammogram results from [facility].  There is a small asymmetrical area in the left breast. The radiologist would like to get more views of the breast to see the area better.  Order added.

## 2011-05-31 ENCOUNTER — Ambulatory Visit: Admit: 2011-05-31 | Payer: Medicare (Managed Care)

## 2011-05-31 DIAGNOSIS — I10 Essential (primary) hypertension: Secondary | ICD-10-CM

## 2011-05-31 LAB — CBC AND DIFFERENTIAL
Basophils Absolute: 30 {cells}/uL (ref 0–200)
Basophils Relative: 0.6 %
Eosinophils Absolute: 45 {cells}/uL (ref 15–500)
Eosinophils Relative: 0.9 %
Hematocrit: 39.6 % (ref 35.0–45.0)
Hemoglobin: 13 g/dL (ref 11.7–15.5)
Lymphocytes Absolute: 1215 {cells}/uL (ref 850–3900)
Lymphocytes Relative: 24.3 %
MCH: 33.4 pg — ABNORMAL HIGH (ref 27.0–33.0)
MCHC: 32.8 g/dL (ref 32.0–36.0)
MCV: 102.1 fL — ABNORMAL HIGH (ref 80.0–100.0)
Monocytes Absolute: 275 {cells}/uL (ref 200–950)
Monocytes Relative: 5.5 %
Neutrophils Absolute: 3435 {cells}/uL (ref 1500–7800)
Neutrophils Relative: 68.7 %
Platelets: 122 Thousand/uL — ABNORMAL LOW (ref 140–400)
RBC: 3.88 Million/uL (ref 3.80–5.10)
RDW: 13.7 % (ref 11.0–15.0)
WBC: 5 Thousand/uL (ref 3.8–10.8)

## 2011-05-31 LAB — COMPREHENSIVE METABOLIC PANEL
A/G Ratio: 1.5 (calc) (ref 1.0–2.5)
ALT: 7 U/L (ref 6–40)
AST: 13 U/L (ref 10–35)
Albumin: 4.1 g/dL (ref 3.6–5.1)
Alkaline Phosphatase: 57 U/L (ref 33–130)
BUN/Creatinine Ratio: 19 (calc) (ref 6–22)
BUN: 28 mg/dL — ABNORMAL HIGH (ref 7–25)
CO2: 27 mmol/L (ref 19–30)
Calcium: 9.2 mg/dL (ref 8.6–10.4)
Chloride: 107 mmol/L (ref 98–110)
Creatinine: 1.46 mg/dL — ABNORMAL HIGH (ref 0.60–0.88)
GFR MDRD Af Amer: 39 mL/min/1.73m2 — ABNORMAL LOW (ref >=60)
Globulin, Total: 2.7 g/dL (ref 1.9–3.7)
Glucose: 97 mg/dL (ref 65–99)
Potassium: 4.1 mmol/L (ref 3.5–5.3)
Sodium: 142 mmol/L (ref 135–146)
Total Bilirubin: 0.3 mg/dL (ref 0.2–1.2)
Total Protein: 6.8 g/dL (ref 6.1–8.1)
eGFR Non-Afr. American: 34 mL/min/1.73m2 — ABNORMAL LOW (ref >=60)

## 2011-05-31 LAB — LIPID PANEL
Chol/HDL Ratio: 3.4 (calc) (ref <=5.0)
Cholesterol, Total: 238 mg/dL — ABNORMAL HIGH (ref 125–200)
HDL: 71 mg/dL (ref >=46)
LDL Calculated: 155 mg/dL — ABNORMAL HIGH (ref <130)
Non HDL Chol. (LDL+VLDL): 167 mg/dL — ABNORMAL HIGH
Triglycerides: 59 mg/dL (ref <150)

## 2011-05-31 MED ORDER — simvastatin (ZOCOR) 20 MG tablet
20 | ORAL_TABLET | Freq: Every day | ORAL | Status: AC
Start: 2011-05-31 — End: 2011-10-05

## 2011-05-31 MED ORDER — lisinopril-hydrochlorothiazide (PRINZIDE,ZESTORETIC) 20-25 mg per tablet
20-25 | ORAL_TABLET | Freq: Every day | ORAL | 0.00 refills | 90.00000 days | Status: AC
Start: 2011-05-31 — End: 2011-10-05

## 2011-05-31 NOTE — Unmapped (Signed)
Subjective  HPI:   Patient ID: Stephanie Suarez is a 78 y.o. female.    Chief Complaint:  HPI     Here for HTN.  Her BP cuff at home doesn't work well.  She denies problems with chest pain or headaches.      She c/o constipation.  She reports that she doesn't drink a lot of water and eats too much bread and meat and not enough fruits and vegetables.      She has allergic rhinitis and takes Flonase for a runny nose.    She has hyperlipidemia and takes Simvastatin. She denies muscle aches on the medications. She reports that she is active and walks a lot.    Past Medical History   Diagnosis Date   ??? Environmental allergies    ??? Hyperlipidemia    ??? Hypertension    ??? Adenoma 08/08     on colonoscopy august 2008     No past surgical history on file.  Family History   Problem Relation Age of Onset   ??? Other Mother      peripartum death   ??? Prostate cancer Father    ??? Alzheimer's disease Sister    ??? Dementia Sister    ??? Suicidality Daughter    ??? Heart attack Son 62   ??? Hypertension Son    ??? Schizophrenia Son    ??? Sleep apnea Son       Prior to Admission medications    Medication Sig Start Date End Date Taking? Authorizing Provider   ascorbic acid (VITAMIN C) 500 MG tablet Take 500 mg by mouth daily.   02/11/09  Yes Historical Provider, MD   fluticasone (FLONASE) 50 mcg/actuation nasal spray 2 sprays by Each Nare route.   08/28/08  Yes Historical Provider, MD   lisinopril-hydrochlorothiazide (PRINZIDE,ZESTORETIC) 20-25 mg per tablet Take 1 tablet by mouth daily. 05/31/11  Yes Abbie Berling J. Wilson Singer, MD   simvastatin (ZOCOR) 20 MG tablet Take 1 tablet (20 mg total) by mouth daily. For cholesterol 05/31/11  Yes Kelsi Benham J. Wilson Singer, MD   vit b complex w-b 12 (VITAMIN B COMPLEX) tablet Take 1 tablet by mouth daily.     Yes Historical Provider, MD     No Known Allergies  Social history- reports that she has never smoked. She does not have any smokeless tobacco history on file. She reports that she does not drink alcohol or use illicit drugs.          ROS:   Review of Systems   Constitutional: Negative for fever and fatigue.   Respiratory: Negative for cough, chest tightness and shortness of breath.    Cardiovascular: Negative for chest pain, palpitations and leg swelling.   Musculoskeletal: Negative for myalgias.          Objective:   Physical Exam   Constitutional: She is oriented to person, place, and time. She appears well-developed and well-nourished.   HENT:   Head: Normocephalic.   Neck: Neck supple.   Cardiovascular: Normal rate, regular rhythm and normal heart sounds.    No murmur heard.  Pulmonary/Chest: Effort normal and breath sounds normal. No respiratory distress. She has no wheezes. She has no rales.   Musculoskeletal: She exhibits no edema.   Neurological: She is alert and oriented to person, place, and time.   Skin: Skin is warm and dry.   Psychiatric: She has a normal mood and affect. Her behavior is normal. Judgment and thought content normal.  Filed Vitals:    05/31/11 0821   BP: 130/70   Pulse: 68   Height: 5' 3 (1.6 m)   Weight: 140 lb (63.504 kg)     Body mass index is 24.80 kg/(m^2).  Body surface area is 1.68 meters squared.     Assessment/Plan:     [name] was seen today for follow-up and hypertension.    Diagnoses and associated orders for this visit:    Hypertension  - Stable, no change in medication  - lisinopril-hydrochlorothiazide (PRINZIDE,ZESTORETIC) 20-25 mg per tablet; Take 1 tablet by mouth daily.  - Comprehensive metabolic panel    Unspecified deficiency anemia  - CBC and differential    Other and unspecified hyperlipidemia  - Encouraged eating a good diet, lot of fruits and veg will also help with constipation as well  - simvastatin (ZOCOR) 20 MG tablet; Take 1 tablet (20 mg total) by mouth daily. For cholesterol  - Lipid Profile    Allergic rhinitis, cause unspecified  -    Continue Flonase.    F/u for PE

## 2011-10-05 MED ORDER — simvastatin (ZOCOR) 20 MG tablet
20 | ORAL_TABLET | Freq: Every day | ORAL | Status: AC
Start: 2011-10-05 — End: 2012-07-31

## 2011-10-05 MED ORDER — lisinopril-hydrochlorothiazide (PRINZIDE,ZESTORETIC) 20-25 mg per tablet
20-25 | ORAL_TABLET | Freq: Every day | ORAL | 0.00 refills | 90.00000 days | Status: AC
Start: 2011-10-05 — End: 2012-04-09

## 2011-10-05 NOTE — Unmapped (Signed)
Patient needs refill on  LISINOPRIL 20-25 mg 1qd  SIMVASTATIN 20mg  1 qd  These are for her MAIL ORDER

## 2011-10-06 ENCOUNTER — Ambulatory Visit: Admit: 2011-10-06 | Discharge: 2011-10-06 | Payer: Medicare (Managed Care) | Attending: Audiologist-Hearing Aid Fitter

## 2011-10-06 ENCOUNTER — Ambulatory Visit: Admit: 2011-10-06 | Discharge: 2011-10-06 | Payer: Medicare (Managed Care) | Attending: Otolaryngic Allergy

## 2011-10-06 DIAGNOSIS — H6120 Impacted cerumen, unspecified ear: Secondary | ICD-10-CM

## 2011-10-06 DIAGNOSIS — H903 Sensorineural hearing loss, bilateral: Secondary | ICD-10-CM

## 2011-10-06 NOTE — Unmapped (Signed)
REMOVAL OF IMPACTED CERUMEN      Anesthesia:  None  Complications:  None  Estimated Blood Loss:  none  Indications:  Cerumen impaction    Procedure:    After informed concern, removal of impacted cerumen was performed with microscopy using curette, suction, and/or right angle pick.  TM ('s) normal.

## 2011-10-06 NOTE — Unmapped (Signed)
No chief complaint on file.       History of Present Illness  C/o fullness both ears, possible hearing loss.  Does not wear aids, last test was about 5 yrs ago.       Cancer Staging  No matching staging information was found for the patient.    Histories  She has a past medical history of Environmental allergies; Hyperlipidemia; Hypertension; and Adenoma (08/08).    She has no past surgical history on file.    Her family history includes Alzheimer's disease in her sister; Dementia in her sister; Heart attack (age of onset:49) in her son; Hypertension in her son; Other in her mother; Prostate cancer in her father; Schizophrenia in her son; Sleep apnea in her son; and Suicidality in her daughter.    She reports that she has never smoked. She does not have any smokeless tobacco history on file. She reports that she does not drink alcohol or use illicit drugs.    Allergies  Review of patient's allergies indicates no known allergies.    Medications  Outpatient Encounter Prescriptions as of 10/06/2011   Medication Sig Dispense Refill   ??? ascorbic acid (VITAMIN C) 500 MG tablet Take 500 mg by mouth daily.         ??? fluticasone (FLONASE) 50 mcg/actuation nasal spray 2 sprays by Each Nare route.         ??? lisinopril-hydrochlorothiazide (PRINZIDE,ZESTORETIC) 20-25 mg per tablet Take 1 tablet by mouth daily.  90 tablet  3   ??? simvastatin (ZOCOR) 20 MG tablet Take 1 tablet (20 mg total) by mouth daily. For cholesterol  90 tablet  3   ??? vit b complex w-b 12 (VITAMIN B COMPLEX) tablet Take 1 tablet by mouth daily.              The following portions of the patient's history were reviewed and updated as appropriate: allergies, current medications, past family history, past medical history, past social history, past surgical history and problem list.    Review of Systems:  * See scanned Review of Systems sheet.    Vitals  There were no vitals taken for this visit.  Physical Exam    Lab Review:   Lab Results   Component Value Date     WBC 5.0 05/31/2011    HGB 13.0 05/31/2011    HCT 39.6 05/31/2011    MCV 102.1* 05/31/2011    PLT 122* 05/31/2011    CREATININE 1.46* 05/31/2011    BUN 28* 05/31/2011    NA 142 05/31/2011    K 4.1 05/31/2011    CL 107 05/31/2011    CO2 27 05/31/2011    ALT 7 05/31/2011    AST 13 05/31/2011    ALKPHOS 57 05/31/2011    BILITOT 0.3 05/31/2011        Assessment  Cerumen impaction.    Plan  RTC prn.       Medical Decision Making:  The following items were considered in medical decision making:  Review/order clinical lab tests}    Audio demonstr hearing left wnl, rt with mild cond HL and neg pressure.

## 2011-10-06 NOTE — Unmapped (Signed)
See attached audiogram.

## 2011-11-19 ENCOUNTER — Ambulatory Visit: Admit: 2011-11-19 | Payer: Medicare (Managed Care)

## 2011-11-19 DIAGNOSIS — J309 Allergic rhinitis, unspecified: Secondary | ICD-10-CM

## 2011-11-19 NOTE — Unmapped (Signed)
Subjective  HPI:   Patient ID: Stephanie Suarez is a 78 y.o. female.    Chief Complaint:  HPI     She c/o 1 week h/o sneezing, coughing.  She then went to [place] and her symptoms got worse. She developed a sore throat and a runny nose.  She is feeling a little better. Her cough is productive occasionally.  She denies fevers, chills, chest pain or SOB.  She has taken Flonase today which she thinks helped.  She has taken Claritin which helps too.         Past Medical History   Diagnosis Date   ??? Environmental allergies    ??? Hyperlipidemia    ??? Hypertension    ??? Adenoma 08/08     on colonoscopy august 2008     No past surgical history on file.  Family History   Problem Relation Age of Onset   ??? Other Mother      peripartum death   ??? Prostate cancer Father    ??? Alzheimer's disease Sister    ??? Dementia Sister    ??? Suicidality Daughter    ??? Heart attack Son 51   ??? Hypertension Son    ??? Schizophrenia Son    ??? Sleep apnea Son       Prior to Admission medications    Medication Sig Start Date End Date Taking? Authorizing Provider   ascorbic acid (VITAMIN C) 500 MG tablet Take 500 mg by mouth daily.   02/11/09  Yes Historical Provider, MD   lisinopril-hydrochlorothiazide (PRINZIDE,ZESTORETIC) 20-25 mg per tablet Take 1 tablet by mouth daily. 10/05/11  Yes Beckhem Isadore J. Wilson Singer, MD   simvastatin (ZOCOR) 20 MG tablet Take 1 tablet (20 mg total) by mouth daily. For cholesterol 10/05/11  Yes Chayse Zatarain J. Wilson Singer, MD   vit b complex w-b 12 (VITAMIN B COMPLEX) tablet Take 1 tablet by mouth daily.     Yes Historical Provider, MD     No Known Allergies  Social history- reports that she has never smoked. She does not have any smokeless tobacco history on file. She reports that she does not drink alcohol or use illicit drugs.    ROS:   Review of Systems   Constitutional: Negative for fever, chills, activity change and fatigue.   HENT: Negative for ear pain, congestion, facial swelling, rhinorrhea, sneezing and tinnitus.    Eyes: Negative for  discharge, redness and itching.   Respiratory: Negative for shortness of breath.    Cardiovascular: Negative for chest pain.   Musculoskeletal: Negative for myalgias.   Skin: Negative for rash.          Objective:   Physical Exam   Constitutional: She appears well-developed and well-nourished.   HENT:   Head: Normocephalic and atraumatic.   Right Ear: Tympanic membrane, external ear and ear canal normal.   Left Ear: Tympanic membrane, external ear and ear canal normal.   Nose: Nose normal.   Mouth/Throat: Uvula is midline, oropharynx is clear and moist and mucous membranes are normal. No oropharyngeal exudate.   Eyes: Conjunctivae normal and EOM are normal. Pupils are equal, round, and reactive to light. Right eye exhibits no discharge. Left eye exhibits no discharge.   Neck: Neck supple. No thyromegaly present.   Cardiovascular: Normal rate, regular rhythm and normal heart sounds.    No murmur heard.  Pulmonary/Chest: Effort normal. No respiratory distress. She has no wheezes. She has no rales. She exhibits no tenderness.   Musculoskeletal:  Normal range of motion.   Lymphadenopathy:     She has no cervical adenopathy.   Skin: Skin is warm and dry.   Psychiatric: She has a normal mood and affect. Her behavior is normal. Judgment and thought content normal.         Filed Vitals:    11/19/11 1101   BP: 120/64   Pulse: 76   Temp: 97.8 ??F (36.6 ??C)   TempSrc: Oral   Height: 5' 3 (1.6 m)   Weight: 134 lb (60.782 kg)     Body mass index is 23.74 kg/(m^2).  Body surface area is 1.64 meters squared.    Assessment/Plan:     [name] was seen today for sore throat, nasal congestion and cough.    Diagnoses and associated orders for this visit:    Allergic rhinitis, cause unspecified  -    Improved with Flonase, continue plus Claritin  Follow-up if no improvement or worsening of symptoms.

## 2012-02-18 NOTE — Unmapped (Signed)
Is it time for her to have her Colonoscopy?

## 2012-02-18 NOTE — Unmapped (Signed)
Notify pt. That her last colonoscopy was in 2011. It's not clear from the notes that I received from Dr. Cassell Smiles when He wanted her to return. She should call his office to fine when her next one is due. Dr. Jim Like [phone]

## 2012-02-19 NOTE — Unmapped (Signed)
Patient informed.

## 2012-02-19 NOTE — Unmapped (Signed)
Needs colonoscopy done at the [facility] office    Referred by Dr Wilson Singer     Has [facility] and [employer]

## 2012-02-20 NOTE — Unmapped (Signed)
Spoke with pt - scheduled her colonoscopy with Dr. Arlis Porta on Tues. 04/08/12 @ [facility].  Mag Citrate prep mailed out today.

## 2012-04-09 MED ORDER — lisinopril-hydrochlorothiazide (PRINZIDE,ZESTORETIC) 20-25 mg per tablet
20-25 | ORAL_TABLET | Freq: Every day | ORAL | 0.00 refills | 90.00000 days | Status: AC
Start: 2012-04-09 — End: 2012-07-31

## 2012-05-27 ENCOUNTER — Ambulatory Visit: Admit: 2012-05-27 | Payer: Medicare (Managed Care)

## 2012-05-27 DIAGNOSIS — J069 Acute upper respiratory infection, unspecified: Secondary | ICD-10-CM

## 2012-05-27 NOTE — Unmapped (Signed)
Subjective  HPI:   Patient ID: Stephanie Suarez is a 79 y.o. female.    Chief Complaint:  HPI     She c/o not feeling well for a week. She started with a runny nose, then a cough. Her throat is sore and her nose is now stuffy.  Her cough is productive, she sometimes has pain in her chest.  She denies fevers or chills. She has taken Vitamin C and a probiotic.  Her son was diagnosed with pneumonia in the last week.      Past medical history, medications, allergies and problem visit reviewed prior to visit.  Past Medical History   Diagnosis Date   ??? Environmental allergies    ??? Hyperlipidemia    ??? Hypertension    ??? Adenoma 08/08     on colonoscopy august 2008     No past surgical history on file.  Family History   Problem Relation Age of Onset   ??? Other Mother      peripartum death   ??? Prostate cancer Father    ??? Alzheimer's disease Sister    ??? Dementia Sister    ??? Suicidality Daughter    ??? Heart attack Son 67   ??? Hypertension Son    ??? Schizophrenia Son    ??? Sleep apnea Son       Prior to Admission medications    Medication Sig Start Date End Date Taking? Authorizing Provider   ascorbic acid (VITAMIN C) 500 MG tablet Take 500 mg by mouth daily.   02/11/09  Yes Historical Provider, MD   lisinopril-hydrochlorothiazide (PRINZIDE,ZESTORETIC) 20-25 mg per tablet Take 1 tablet by mouth daily. 04/09/12  Yes Salathiel Ferrara J. Wilson Singer, MD   simvastatin (ZOCOR) 20 MG tablet Take 1 tablet (20 mg total) by mouth daily. For cholesterol 10/05/11  Yes Kadesha Virrueta J. Wilson Singer, MD   vit b complex w-b 12 (VITAMIN B COMPLEX) tablet Take 1 tablet by mouth daily.     Yes Historical Provider, MD     No Known Allergies  Social history- reports that she has never smoked. She does not have any smokeless tobacco history on file. She reports that she does not drink alcohol or use illicit drugs.   ROS:   Review of Systems   Constitutional: Positive for fatigue. Negative for fever, chills and activity change.   HENT: Positive for rhinorrhea. Negative for ear pain,  congestion, facial swelling, sneezing and tinnitus.    Eyes: Negative for discharge, redness and itching.   Respiratory: Positive for cough. Negative for shortness of breath.    Cardiovascular: Negative for chest pain.   Musculoskeletal: Negative for myalgias.   Skin: Negative for rash.          Objective:   Physical Exam   Constitutional: She appears well-developed and well-nourished.   HENT:   Head: Normocephalic and atraumatic.   Right Ear: Tympanic membrane, external ear and ear canal normal.   Left Ear: Tympanic membrane, external ear and ear canal normal.   Nose: Rhinorrhea present.   Mouth/Throat: Uvula is midline, oropharynx is clear and moist and mucous membranes are normal. No oropharyngeal exudate.   Eyes: Conjunctivae and EOM are normal. Pupils are equal, round, and reactive to light. Right eye exhibits no discharge. Left eye exhibits no discharge.   Neck: Neck supple. No thyromegaly present.   Cardiovascular: Normal rate, regular rhythm and normal heart sounds.    No murmur heard.  Pulmonary/Chest: Effort normal. No respiratory distress. She has no  wheezes. She has no rales. She exhibits no tenderness.   Musculoskeletal: Normal range of motion.   Lymphadenopathy:     She has no cervical adenopathy.   Skin: Skin is warm and dry.   Psychiatric: She has a normal mood and affect. Her behavior is normal. Judgment and thought content normal.         Filed Vitals:    05/27/12 0901   BP: 136/64   Pulse: 68   Temp: 96.4 ??F (35.8 ??C)   TempSrc: Oral   Height: 5' 3 (1.6 m)   Weight: 133 lb (60.328 kg)     Body mass index is 23.57 kg/(m^2).  Body surface area is 1.64 meters squared.    Assessment/Plan:     .Stephanie Suarez was seen today for nasal congestion, cough and sore throat.    Diagnoses and associated orders for this visit:    URI (upper respiratory infection)  Recommend supportive care, fluids, rest, Vitamin C, tea with honey for cough.  Follow-up if no improvement or worsening of symptoms.

## 2012-07-22 NOTE — Unmapped (Signed)
ERROR

## 2012-07-31 ENCOUNTER — Ambulatory Visit: Admit: 2012-07-31 | Payer: Medicare (Managed Care)

## 2012-07-31 ENCOUNTER — Inpatient Hospital Stay: Admit: 2012-07-31 | Payer: Medicare (Managed Care)

## 2012-07-31 DIAGNOSIS — I10 Essential (primary) hypertension: Secondary | ICD-10-CM

## 2012-07-31 DIAGNOSIS — Z0000 Encounter for general adult medical examination without abnormal findings: Secondary | ICD-10-CM

## 2012-07-31 LAB — COMPREHENSIVE METABOLIC PANEL, SERUM
ALT: 30 U/L (ref 13–69)
AST (SGOT): 20 U/L (ref 11–53)
Albumin: 4 g/dL (ref 3.6–5.1)
Alkaline Phosphatase: 58 U/L (ref 33–130)
Anion Gap: 9 mmol/L (ref 3–16)
BUN: 34 mg/dL (ref 7–25)
CO2: 30 mmol/L (ref 21–33)
Calcium: 9.6 mg/dL (ref 8.6–10.2)
Chloride: 103 mmol/L (ref 98–110)
Creatinine: 1.16 mg/dL (ref 0.50–1.20)
GFR MDRD Af Amer: 54 See note.
GFR MDRD Non Af Amer: 45 See note.
Glucose: 84 mg/dL (ref 65–99)
Osmolality, Calculated: 301 mOsm/kg (ref 278–305)
Potassium: 4.1 mmol/L (ref 3.5–5.3)
Sodium: 142 mmol/L (ref 135–146)
Total Bilirubin: 0.5 mg/dL (ref 0.2–1.2)
Total Protein: 7.2 g/dL (ref 6.2–8.3)

## 2012-07-31 LAB — LIPID PANEL
Cholesterol, Total: 229 mg/dL — ABNORMAL HIGH (ref 0–200)
HDL: 59 mg/dL (ref 30–60)
LDL Cholesterol: 158 mg/dL (ref 0–160)
Triglycerides: 62 mg/dL (ref 10–150)

## 2012-07-31 MED ORDER — lisinopril-hydrochlorothiazide (PRINZIDE,ZESTORETIC) 20-25 mg per tablet
20-25 | ORAL_TABLET | Freq: Every day | ORAL | 0.00 refills | 90.00000 days | Status: AC
Start: 2012-07-31 — End: 2012-10-30

## 2012-07-31 MED ORDER — simvastatin (ZOCOR) 20 MG tablet
20 | ORAL_TABLET | Freq: Every day | ORAL | Status: AC
Start: 2012-07-31 — End: 2012-11-21

## 2012-07-31 NOTE — Unmapped (Addendum)
Preventive Service Frequency  Next due    Body Mass Index (BMI)____  Body mass index is 23.39 kg/(m^2).    Height _______  Weight    Annually    Blood Pressure _______/_______  BP: 98/50 mmHg    Every 2 yrs, if BP </= 120/80 mm hg;    Annually, if BP >120-139/80-89 mm hg done   Vision     Every 3 yrs up to age 77;    Every 2 yrs aged 53+ done   Breast Cancer Screening (Mammogram)   Every 2 yrs, aged 67-74 yrs now   Cervical Cancer Screening (Pap Smear)   Every 3 yrs, aged 68-64 yrs;    Every 5 yrs, aged 11-65 with HPV testing done   Osteoporosis Screening (Bone Density Measurement)   Routinely, for women aged 65+    Routinely, for women aged 78-64 with risk factors    Colorectal Cancer Screening   Annually, Fecal Occult Blood Stool (FOBS);    Every 5 yrs, Sigmoidoscopy with FOBS;    Every 10 yrs, Colonoscopy done   Immunizations:     Pneumococcal (Pneumonia) Vaccine     Influenza (Flu) Vaccine   Pneumonia: 1-2 doses up to age 33;    Pneumonia: 1 dose age 79+    Influenza: Annually   done   Cholesterol Testing Regularly beginning at age 44 with risk factors   due   Diabetes Screening   With a sustained BP >/= 135/80 mm Hg    Other

## 2012-07-31 NOTE — Unmapped (Signed)
Subjective  HPI:   Patient ID: Stephanie Suarez is a 79 y.o. female.    Chief Complaint:  HPI       Annual Wellness Exam and Personalized Prevention Plan    Stephanie Suarez is a 59 y.o. woman here today for an initial Annual Wellness Exam.    Health Risk Assessment completed by patient and concerns in the following areas were found and addressed:    Depression: none  Hearing loss: Can't hear well out of right side.  Did poorly on audiogram on the right side  Physical complaints/nutrition: She has a bump that itches now and then   ADL's and support system: her son does cooking and house cleaning  Driving: no problems, no recent accidents, avoids driving at night due to glare.    Healthy habits: can't eat hard food due to loose dentures, eats mostly soft food  Taking medications:  Doesn't take Simvastatin because she lost it.    Fall risk:  none  Patient's Medical and Surgical History:      Past Medical History   Diagnosis Date   ??? Environmental allergies    ??? Hyperlipidemia    ??? Hypertension    ??? Adenoma 08/08     on colonoscopy august 2008     No past surgical history on file.  Prior to Admission medications    Medication Sig Start Date End Date Taking? Authorizing Provider   ascorbic acid (VITAMIN C) 500 MG tablet Take 500 mg by mouth daily.   02/11/09  Yes Historical Provider, MD   lisinopril-hydrochlorothiazide (PRINZIDE,ZESTORETIC) 20-25 mg per tablet Take 0.5 tablets by mouth daily. 07/31/12  Yes [place] Hudman J. Wilson Singer, MD   vit b complex w-b 12 (VITAMIN B COMPLEX) tablet Take 1 tablet by mouth daily.     Yes Historical Provider, MD   lisinopril-hydrochlorothiazide (PRINZIDE,ZESTORETIC) 20-25 mg per tablet Take 1 tablet by mouth daily. 04/09/12 07/31/12 Yes Samiha Denapoli J. Wilson Singer, MD     Patient Care Team:  Marnette Burgess. Wilson Singer, MD as PCP - General    Mini-cog: Pass    Immunization History   Administered Date(s) Administered   ??? Influenza Preservative 12/19/2011   ??? Pneumococcal Polysaccharide 08/01/2006   ??? influenza unspec 02/11/2009,  02/09/2010     Lab Results   Component Value Date    HMCOLON Polyp 04/08/2012    HMPAP neg 08/01/2006     Lab Results   Component Value Date    GLUCOSE 97 05/31/2011     Lab Results   Component Value Date    LDLCALC 155* 05/31/2011     History   Smoking status   ??? Never Smoker    Smokeless tobacco   ??? Not on file     Comment: 06/27/2010     ROS:   Review of Systems   Constitutional: Negative for fever and fatigue.   Respiratory: Negative for cough, chest tightness and shortness of breath.    Cardiovascular: Negative for chest pain, palpitations and leg swelling.   Musculoskeletal: Negative for myalgias.          Objective:   Physical Exam   Constitutional: She is oriented to person, place, and time. She appears well-developed and well-nourished.   HENT:   Head: Normocephalic and atraumatic.   Right Ear: Tympanic membrane, external ear and ear canal normal.   Left Ear: Tympanic membrane, external ear and ear canal normal.   Nose: Nose normal.   Mouth/Throat: Uvula is midline, oropharynx is clear and  moist and mucous membranes are normal. No oropharyngeal exudate.   Right canal-cerumen impaction, removed with currette   Eyes: Conjunctivae and EOM are normal. Pupils are equal, round, and reactive to light. Right eye exhibits no discharge. Left eye exhibits no discharge.   Neck: Neck supple. No thyromegaly present.   Cardiovascular: Normal rate, regular rhythm and normal heart sounds.    No murmur heard.  Pulmonary/Chest: Effort normal and breath sounds normal. No respiratory distress. She has no wheezes. She has no rales. She exhibits no tenderness.   Abdominal: Soft. Bowel sounds are normal. She exhibits no mass. There is no hepatosplenomegaly. There is no tenderness. There is no rebound and no guarding.   Musculoskeletal: Normal range of motion. She exhibits no edema.   Lymphadenopathy:     She has no cervical adenopathy.   Neurological: She is alert and oriented to person, place, and time.   Skin: Skin is warm and dry.    Psychiatric: She has a normal mood and affect. Her behavior is normal. Judgment and thought content normal.         Filed Vitals:    07/31/12 0920   BP: 98/50   Pulse: 72   Height: 5' 3 (1.6 m)   Weight: 132 lb (59.875 kg)     Body mass index is 23.39 kg/(m^2).  Body surface area is 1.63 meters squared.    Assessment/Plan:     Stephanie Suarez was seen today for follow-up and hypertension.    Diagnoses and associated orders for this visit:    Well adult exam  -     Routine Health Maintenance-Discussed eating 5 fruits and vegetables a day, 30 minutes of daily exercise, regular sleep and safety concerns.  -     Recommended mammogram this year.  -     Check insurance regarding shingles vaccine    Other and unspecified hyperlipidemia  - Restart simvastatin (ZOCOR) 20 MG tablet; Take 1 tablet (20 mg total) by mouth daily. For cholesterol  - Lipid Profile; Future    Hypertension  - Well controlled, decrease lisinopril-hydrochlorothiazide (PRINZIDE,ZESTORETIC) 20-25 mg per tablet; Take 0.5 tablets by mouth daily.  - Comprehensive Metabolic Panel, Serum; Future    Problems identified requiring follow up visit/referral/diagnostic workup: none    Community resource needs identified and referral signed and faxed: no  F/u in 6 months

## 2012-08-01 ENCOUNTER — Encounter

## 2012-08-05 NOTE — Unmapped (Signed)
Normal Mammogram results added to results consult.

## 2012-09-06 ENCOUNTER — Inpatient Hospital Stay: Admit: 2012-09-06 | Discharge: 2012-09-06 | Attending: Emergency Medicine

## 2012-09-06 LAB — CBC WITH AUTO DIFFERENTIAL
Basophils %: 0.4 %
Basophils Absolute: 0 10*3/uL (ref 0.0–0.2)
Eosinophils %: 0.8 %
Eosinophils Absolute: 0 10*3/uL (ref 0.0–0.6)
Hematocrit: 36.1 % (ref 36.0–48.0)
Hemoglobin: 12 g/dL (ref 12.0–16.0)
Lymphocytes %: 23.7 %
Lymphocytes Absolute: 1.1 10*3/uL (ref 1.0–5.1)
MCH: 33.3 pg (ref 26.0–34.0)
MCHC: 33.3 g/dL (ref 31.0–36.0)
MCV: 99.8 fL (ref 80.0–100.0)
MPV: 8.8 fL (ref 5.0–10.5)
Monocytes %: 7.1 %
Monocytes Absolute: 0.3 10*3/uL (ref 0.0–1.3)
Neutrophils %: 68 %
Neutrophils Absolute: 3.2 10*3/uL (ref 1.7–7.7)
Platelets: 143 10*3/uL (ref 135–450)
RBC: 3.62 M/uL — ABNORMAL LOW (ref 4.00–5.20)
RDW: 12.6 % (ref 12.4–15.4)
WBC: 4.7 10*3/uL (ref 4.0–11.0)

## 2012-09-06 LAB — PROTIME-INR
INR: 0.98 (ref 0.85–1.15)
Protime: 11 s (ref 10.0–12.8)

## 2012-09-06 LAB — BASIC METABOLIC PANEL
BUN: 16 mg/dL (ref 7–20)
CO2: 30 mmol/L (ref 21–32)
Calcium: 9.4 mg/dL (ref 8.3–10.6)
Chloride: 105 mmol/L (ref 99–110)
Creatinine: 0.9 mg/dL (ref 0.6–1.2)
GFR African American: >60 (ref >60)
GFR Non-African American: >60 (ref >60)
Glucose: 94 mg/dL (ref 70–99)
Potassium: 3.8 mmol/L (ref 3.5–5.1)
Sodium: 141 mmol/L (ref 136–145)

## 2012-09-06 LAB — APTT: aPTT: 34.3 s (ref 23.1–35.5)

## 2012-09-06 MED ORDER — ONDANSETRON 4 MG PO TBDP
4 | ORAL_TABLET | Freq: Two times a day (BID) | ORAL | Status: DC | PRN
Start: 2012-09-06 — End: 2013-06-02

## 2012-09-06 MED ORDER — ONDANSETRON 4 MG PO TBDP
4 MG | Freq: Once | ORAL | Status: AC
Start: 2012-09-06 — End: 2012-09-06
  Administered 2012-09-06: 18:00:00 4 mg via ORAL

## 2012-09-06 MED ORDER — SODIUM CHLORIDE 0.9 % IV BOLUS
0.9 % | Freq: Once | INTRAVENOUS | Status: AC
Start: 2012-09-06 — End: 2012-09-06
  Administered 2012-09-06: 18:00:00 500 mL via INTRAVENOUS

## 2012-09-06 MED ORDER — MECLIZINE HCL 12.5 MG PO TABS
12.5 MG | Freq: Once | ORAL | Status: AC
Start: 2012-09-06 — End: 2012-09-06
  Administered 2012-09-06: 18:00:00 12.5 mg via ORAL

## 2012-09-06 MED ORDER — MECLIZINE HCL 25 MG PO TABS
25 | ORAL_TABLET | Freq: Three times a day (TID) | ORAL | Status: AC | PRN
Start: 2012-09-06 — End: 2012-09-16

## 2012-09-06 MED ORDER — ONDANSETRON HCL 4 MG/2ML IJ SOLN
4 MG/2ML | Freq: Once | INTRAMUSCULAR | Status: AC
Start: 2012-09-06 — End: 2012-09-06
  Administered 2012-09-06: 18:00:00 4 mg via INTRAVENOUS

## 2012-09-06 MED ORDER — MECLIZINE HCL 12.5 MG PO TABS
12.5 MG | Freq: Once | ORAL | Status: AC
Start: 2012-09-06 — End: 2012-09-06
  Administered 2012-09-06: 19:00:00 25 mg via ORAL

## 2012-09-06 MED FILL — ONDANSETRON HCL 4 MG/2ML IJ SOLN: 4 MG/2ML | INTRAMUSCULAR | Qty: 2

## 2012-09-06 MED FILL — ONDANSETRON 4 MG PO TBDP: 4 mg | ORAL | Qty: 1

## 2012-09-06 MED FILL — MECLIZINE HCL 12.5 MG PO TABS: 12.5 mg | ORAL | Qty: 1

## 2012-09-06 MED FILL — SODIUM CHLORIDE 0.9 % IV SOLN: 0.9 % | INTRAVENOUS | Qty: 500

## 2012-09-06 MED FILL — MECLIZINE HCL 12.5 MG PO TABS: 12.5 mg | ORAL | Qty: 2

## 2012-09-06 NOTE — Discharge Instructions (Signed)
Benign Positional Vertigo  Vertigo means you feel like you or your surroundings are moving when they are not. Benign positional vertigo is the most common form of vertigo. Benign means that the cause of your condition is not serious. Benign positional vertigo is more common in older adults.  CAUSES   Benign positional vertigo is the result of an upset in the labyrinth system. This is an area in the middle ear that helps control your balance. This may be caused by a viral infection, head injury, or repetitive motion. However, often no specific cause is found.  SYMPTOMS   Symptoms of benign positional vertigo occur when you move your head or eyes in different directions. Some of the symptoms may include:   Loss of balance and falls.   Vomiting.   Blurred vision.   Dizziness.   Nausea.   Involuntary eye movements (nystagmus).  DIAGNOSIS   Benign positional vertigo is usually diagnosed by physical exam. If the specific cause of your benign positional vertigo is unknown, your caregiver may perform imaging tests, such as magnetic resonance imaging (MRI) or computed tomography (CT).  TREATMENT   Your caregiver may recommend movements or procedures to correct the benign positional vertigo. Medicines such as meclizine, benzodiazepines, and medicines for nausea may be used to treat your symptoms. In rare cases, if your symptoms are caused by certain conditions that affect the inner ear, you may need surgery.  HOME CARE INSTRUCTIONS    Follow your caregiver's instructions.   Move slowly. Do not make sudden body or head movements.   Avoid driving.   Avoid operating heavy machinery.   Avoid performing any tasks that would be dangerous to you or others during a vertigo episode.   Drink enough fluids to keep your urine clear or pale yellow.  SEEK IMMEDIATE MEDICAL CARE IF:    You develop problems with walking, weakness, numbness, or using your arms, hands, or legs.   You have difficulty speaking.   You develop  severe headaches.   Your nausea or vomiting continues or gets worse.   You develop visual changes.   Your family or friends notice any behavioral changes.   Your condition gets worse.   You have a fever.   You develop a stiff neck or sensitivity to light.  MAKE SURE YOU:    Understand these instructions.   Will watch your condition.   Will get help right away if you are not doing well or get worse.  Document Released: 12/18/2005 Document Revised: 06/04/2011 Document Reviewed: 11/30/2010  [facility] Patient Information 2014 [place].        Cerumen Impaction  A cerumen impaction is when the wax in your ear forms a plug. This plug usually causes reduced hearing. Sometimes it also causes an earache or dizziness. Removing a cerumen impaction can be difficult and painful. The wax sticks to the ear canal. The canal is sensitive and bleeds easily. If you try to remove a heavy wax buildup with a cotton tipped swab, you may push it in further.  Irrigation with water, suction, and small ear curettes may be used to clear out the wax. If the impaction is fixed to the skin in the ear canal, ear drops may be needed for a few days to loosen the wax. People who build up a lot of wax frequently can use ear wax removal products available in your local drugstore.  SEEK MEDICAL CARE IF:   You develop an earache, increased hearing loss, or marked  dizziness.  Document Released: 04/19/2004 Document Revised: 06/04/2011 Document Reviewed: 06/09/2009  [facility] Patient Information 2014 [place], [place].

## 2012-09-06 NOTE — ED Notes (Signed)
Patient requesting to have her left ear irrigated PA Tewell at bedside and states this is okay. Tech Rachel aware. Call light within reach.    Shelby Mattocks, RN  09/06/12 [phone]

## 2012-09-06 NOTE — ED Notes (Signed)
Patient states after she vomited she no longer has the vomiting and no longer has the mid epigastric discomfort. Will continue to monitor.     Marygrace Drought, RN  09/06/12 1414

## 2012-09-06 NOTE — ED Notes (Signed)
Patient vomited immediately after given ODT Zofran and Antivert. PA Tewell notified see orders.      Shelby Mattocks, RN  09/06/12 [phone]

## 2012-09-06 NOTE — ED Notes (Signed)
Pt with call light on. C/o epigastric pain. States she feels nauseated. Olivia Mackie, [place] and Sudie Grumbling RN made aware.    Pamelia Hoit, RN  09/06/12 1335

## 2012-09-06 NOTE — ED Notes (Signed)
Dr Honor Loh back into check patient's ear and gave rx for ear gtts.    Marygrace Drought, RN  09/06/12 [phone]

## 2012-09-06 NOTE — ED Notes (Signed)
Patient states small amt wax removed from left ear.    Marygrace Drought, RN  09/06/12 [phone]

## 2012-09-06 NOTE — ED Provider Notes (Signed)
HPI     [facility]    Pt Name: Amber Palmer  MRN: [id number]  Birthdate 03-25-1930  Date of evaluation: 09/06/2012  Provider: Alesia Morin, PA  Attending provider: Tonna Boehringer, MD    PCP: Ranelle Oyster    Chief Complaint   Patient presents with   ??? Dizziness     c/o room spinning and nausea after waking up this AM       Nursing Notes, Past Medical Hx, Past Surgical Hx, Social Hx, Allergies, and Family Hx were all reviewed and agreed with or any disagreements were addressed in the HPI.    HPI:  (Location, Duration, Timing, Severity, Quality, Assoc Sx, Context, Modifying factors)  This is a  79 y.o. female who presents to the emergency department with complaints of feeling dizzy, sensation of room spinning and nauseated starting last night.  Patient denies ever having these symptoms previously.  Patient states that this morning on waking up the symptoms have worsened.  Patient currently denies any pain.  Patient denies any difficulty with speech, gait, denies numbness, tingling or weakness.  Patient's symptoms are worsened with standing as well as looking to the left.  Patient denies any headache, lightheadedness.  Patient states that she has been nauseated, has not had any emesis.  Denies abdominal pain, diarrhea or constipation.  Denies any fevers or chills.  Denies any chest pain, shortness of breath.  Denies any ear pain, ringing in the ears or decreased hearing.     Past Medical/Surgical History:      Diagnosis Date   ??? Hypertension    ??? Hyperlipidemia      History reviewed. No pertinent past surgical history.    Medications:  No current facility-administered medications on file prior to encounter.     Current Outpatient Prescriptions on File Prior to Encounter   Medication Sig Dispense Refill   ??? LISINOPRIL PO Take  by mouth.           Review of Systems   Constitutional: Negative for fever and chills.   HENT: Negative for neck pain and neck stiffness.    Eyes: Negative for photophobia and  visual disturbance.   Respiratory: Negative for shortness of breath.    Cardiovascular: Negative for chest pain.   Gastrointestinal: Positive for nausea. Negative for vomiting, abdominal pain, diarrhea and constipation.   Neurological: Positive for dizziness. Negative for syncope, facial asymmetry, speech difficulty, weakness, light-headedness, numbness and headaches.   All other systems reviewed and are negative.        Physical Exam   Nursing note and vitals reviewed.  Constitutional: She is oriented to person, place, and time. She appears well-developed and well-nourished.   HENT:   Head: Normocephalic.   Right Ear: Hearing, tympanic membrane, external ear and ear canal normal.   Left Ear: Hearing, external ear and ear canal normal. No foreign bodies. Tympanic membrane is not injected, not perforated, not erythematous and not retracted. No hemotympanum.   Nose: Nose normal.   Left TM abnormal.   Eyes: Conjunctivae are normal. Pupils are equal, round, and reactive to light. Right eye exhibits no discharge. Left eye exhibits no discharge. Right eye exhibits normal extraocular motion and no nystagmus. Left eye exhibits nystagmus (with looking left). Left eye exhibits normal extraocular motion. Right pupil is round and reactive. Left pupil is round and reactive. Pupils are equal.   Neck: Normal range of motion. Neck supple.   Cardiovascular: Normal rate and regular rhythm.  Exam reveals no gallop and no friction rub.    Murmur heard.  Pulmonary/Chest: Effort normal and breath sounds normal. No respiratory distress. She has no wheezes. She has no rales.   Abdominal: Soft. Bowel sounds are normal. She exhibits no distension and no mass. There is no tenderness. There is no rebound and no guarding.   Musculoskeletal: Normal range of motion.   Neurological: She is alert and oriented to person, place, and time. She has normal strength. She is not disoriented. No cranial nerve deficit or sensory deficit. Coordination  normal. GCS eye subscore is 4. GCS verbal subscore is 5. GCS motor subscore is 6.   Negative pronator drift, normal finger to nose, cranial nerves II through XII intact.   Skin: Skin is warm and dry. She is not diaphoretic. No pallor.   Psychiatric: She has a normal mood and affect. Her behavior is normal.       Procedures    MDM  MEDICAL DECISION MAKING    Vitals:    Filed Vitals:    09/06/12 1449 09/06/12 1452 09/06/12 1500 09/06/12 1530   BP: 136/52 145/63 132/62 137/55   Pulse: 66 67     Temp:       TempSrc:       Resp:       Height:       Weight:       SpO2:           LABS:   Labs Reviewed   CBC WITH AUTO DIFFERENTIAL - Abnormal; Notable for the following:     RBC 3.62 (*)     All other components within normal limits   BASIC METABOLIC PANEL   PROTIME-INR   APTT        Remainder of labs reviewed and were negative at this time or not returned at the time of this note.    Orders:  Orders Placed This Encounter   Procedures   ??? CT Head WO Contrast   ??? CBC Auto Differential   ??? Basic Metabolic Panel   ??? Protime-INR   ??? APTT   ??? Telemetry monitoring   ??? Orthostatic blood pressure and pulse   ??? EKG 12 lead   ??? Saline lock IV       EKG: Interpreted by the attending and myself in the absence of a real-time interpretation by the cardiologist.  EKG as read by myself in absence of cardiology shows normal sinus rhythm, ventricular rate of 64, normal partner will encourage duration, QTC 408.  Normal axis.  No sign of acute ischemia.  No old EKG to compare.    X-RAYS: The attending, Tonna Boehringer, MD, and I have viewed the radiologic plain film image(s).  The plain films will be read or overread by the radiologist.  ALL OTHER NON-PLAIN FILM IMAGES SUCH AS CT, ULTRASOUND AND MRI HAVE BEEN READ BY THE RADIOLOGIST.  CT HEAD WO CONTRAST    Final Result: IMPRESSION:           1. No acute intracranial abnormality.    2. Age related changes as described above.     MEDICAL DECISION MAKING / ED COURSE:    Patient was given the  following medications:  Medications   meclizine (ANTIVERT) tablet 12.5 mg (12.5 mg Oral Given 09/06/12 1336)   ondansetron (ZOFRAN-ODT) disintegrating tablet 4 mg (4 mg Oral Given 09/06/12 1336)   0.9 % sodium chloride bolus (0 mLs Intravenous Stop Time 09/06/12 1545)   ondansetron (ZOFRAN)  injection 4 mg (4 mg Intravenous Given 09/06/12 1410)   meclizine (ANTIVERT) tablet 25 mg (25 mg Oral Given 09/06/12 1505)   Patient presents to the emergency department after a being dizzy since last night. Patient remains stable throughout ED course.  Patient does have left ear TM abnormality and cerumen, right ear canal clear and TM normal.  Consider patient's vertigo secondary to left ear infection.  Irrigation was performed with minimal cerumen removed.  Patient will be started on Cortisporin ear drops for left ear.  Patient has a normal EKG.  Patient afebrile and nontoxic in appearance.  At this time no signs that indicate cardiac arrhythmia, TIA/CVA, dehydration, hypoglycemia, SAH or orthostatic hypotension. Patient does feel better after oral Zofran and meclizine, however patient did have an episode of emesis two minutes after receiving this medication. IV fluids, IV Zofran given. Patient re-dosed with Antivert. Normal head CT.    Patient without symptoms prior to discharge. Patient is not orthostatic. Patient vitals stable prior to discharge. Patient given vertigo instructions. Patient verbally agreed to and understood plan of care both during ED course and for after discharge home. Patient stable for outpatient follow-up.      The patient tolerated their visit well.  They were seen and evaluated by the attending physician who agreed with the assessment and plan.  The patient and / or the family were informed of the results of any tests, a time was given to answer questions, a plan was proposed and they agreed with plan.      CLINICAL IMPRESSION:    1. Benign paroxysmal positional vertigo, unspecified laterality    2. Cerumen  impaction, left        DISPOSITION     PATIENT REFERRED TO:  Ranelle Oyster    Schedule an appointment as soon as possible for a visit      Jones Skene, MD  [address]  [address]  [place] [address]  [phone]      Ear Nose Throat Specialist      DISCHARGE MEDICATIONS:  Discharge Medication List as of 09/06/2012  3:31 PM      START taking these medications    Details   meclizine (ANTIVERT) 25 MG tablet Take 1 tablet by mouth 3 times daily as needed for 10 days., Disp-30 tablet, R-0      ondansetron (ZOFRAN ODT) 4 MG disintegrating tablet Take 1-2 tablets by mouth every 12 hours as needed for Nausea. May Sub regular tablet (non-ODT) if insurance does not cover ODT., Disp-12 tablet, R-0             (Please note the MDM and HPI sections of this note were completed with a voice recognition program.  Efforts were made to edit the dictations but occasionally words are mis-transcribed.)    Toneshia Coello [employer], PA        Abijah Roussel N Kiriana Worthington, PA  09/06/12 1635

## 2012-09-06 NOTE — ED Notes (Signed)
Patient awake and alert. C/o mid epigastric discomfort. Also nauseated meds given as ordered. Patient on the bedside monitor. Call light within reach.    Shelby Mattocks, RN  09/06/12 1340

## 2012-09-06 NOTE — ED Notes (Signed)
Patient and her son given discharge instructions and rxs. Verbalized understanding. VSS. Patient taken to discharge office via wheelchair.    Shelby Mattocks, RN  09/06/12 [phone]

## 2012-09-06 NOTE — ED Provider Notes (Signed)
I have personally seen and examined this patient. I have fully participated in the care of this patient. I have reviewed and agree with all pertinent clinical information including history, physical exam, diagnostic tests, and the plan.   HPI: [name] presented with   Chief Complaint   Patient presents with   ??? Dizziness     c/o room spinning and nausea after waking up this AM     Review of Systems: See PA note  Vital Signs: BP 145/63   Pulse 67   Temp(Src) 97.1 ??F (36.2 ??C) (Oral)   Resp 19   Ht 5\' 3"  (1.6 m)   Wt 133 lb (60.328 kg)   BMI 23.57 kg/m2   SpO2 100%  Alert 79 y.o. female who does not appear toxic or acutely ill  HENT: Atraumatic, oral mucosa moist  Neck: Grossly normal ROM  Chest/Lungs: respiratory effort normal   HRRR  LCTAB  Patient alert and oriented.  GCS  15.  CN II-XII intact.  Clear speech.  Strength 5/5 globally  Musculoskeletal: Grossly normal ROM  Skin: No palor or diaphoresis  Medical Decision Making and Plan:  Pertinent Labs & Imaging studies reviewed. (See chart for details)  I evaluated this patient at 1420.    Patient with dizziness since last night, some nausea.  She is neurologically intact.  CT shows no evidence of CVA or TIA.  Likely vertigo.  Feeling better after meclizine.  Will discharge home with meclizine and Zofran.  I agree with PA Tewell assessment and plan.        Tonna Boehringer, MD  09/06/12 [phone]

## 2012-09-06 NOTE — Unmapped (Signed)
________________________________________________________________________________________________________________________       Your patient was seen at a [facility]. Please go to     [email or web address] to view information filed to your patient's chart in Epic.       If you need to view your patient's results prior to gaining access to Epic CareLink, please contact the [facility] where your patient was seen.    ________________________________________________________________________________________________________________________          Stephanie Suarez [phone] ([id number])  (79 y.o. F)   PCP: Viann Fish      [facility] ED Arrival Information  ________________________________________________________________________________________________________________________     Expected Arrival            Acuity   Means of Arrival   Escorted By Service            Admission Type Arrival                                                                                                           Complaint  ________________________________________________________________________________________________________________________     -        09/06/2012 12:50 PM 3-Urgent Personal Transport -           Emergency Medicine Emergency      STOMACH PAIN  ________________________________________________________________________________________________________________________    [facility] Chief Complaint  ________________________________________________________________________________________________________________________     Dizziness                                       c/o room spinning and nausea after waking up this AM  ________________________________________________________________________________________________________________________    ED Vitals  ________________________________________________________________________________________________________________________     Date/Time            Temp            Pulse           Resp            BP              SpO2            Weight       Who    ________________________________________________________________________________________________________________________     09/06/12 1530       --              --              --              137/55          --              --           FEK     09/06/12 1500       --              --              --  132/62          --              --           FEK     09/06/12 1452       --              67              --              145/63          --              --           RG     09/06/12 1449       --              66              --              136/52          --              --           RG     09/06/12 1414       --              63              19              126/59          100 %           --           FEK     09/06/12 1400       --              60              17              --              --              --           FEK     09/06/12 1330       --              64              14              --              97 %            --           FEK     09/06/12 1256       97.1 ??????F         70              16              142/59          99 %            133 lb       SH                         (36.2 ??????C)                                                                       (  60.328 kg)  ________________________________________________________________________________________________________________________    [facility] Allergies (verified on: 09/06/12)  ________________________________________________________________________________________________________________________     (No Known Allergies)  ________________________________________________________________________________________________________________________    [facility] Medical History  ________________________________________________________________________________________________________________________     Past Medical History                            Date                     Comments  ________________________________________________________________________________________________________________________     Hypertension [401.9]     Hyperlipidemia [272.4]  ________________________________________________________________________________________________________________________    Surgical History  ________________________________________________________________________________________________________________________     None  ________________________________________________________________________________________________________________________    Obstetric History  ________________________________________________________________________________________________________________________     The patient has not been asked about pregnancy.  ________________________________________________________________________________________________________________________    ED Provider Notes  ________________________________________________________________________________________________________________________       Alesia Morin, PA   09/06/2012  4:35 PM        Cosign Required          HPI          [facility]          Pt Name: Stephanie Suarez        MRN: [id number]        Birthdate Feb 26, 1930        Date of evaluation: 09/06/2012        Provider: Alesia Morin, PA        Attending provider: Tonna Boehringer, MD          PCP: Ranelle Oyster            Chief Complaint        Patient presents with        *   Dizziness                c/o room spinning and nausea after waking up this AM          Nursing Notes , Past Medical Hx, Past Surgical Hx, Social Hx, Allergies, and Family Hx were all reviewed and        agreed with or any disagreements were addressed in the HPI.          HPI: (Location, Duration, Timing, Severity, Quality, Assoc Sx, Context, Modifying factors) This is a 79 y.o.        female who presents to the emergency department with complaints of feeling dizzy,  sensation of room spinning and        nauseated starting last night.  Patient denies ever having these symptoms previously.  Patient states that this        morning on waking up the symptoms have worsened.  Patient currently denies any pain.  Patient denies any        difficulty with speech, gait, denies numbness, tingling or weakness.  Patient's symptoms are worsened with        standing as well as looking to the left.  Patient denies any headache, lightheadedness.  Patient states that she        has been nauseated, has not had any emesis.  Denies abdominal pain, diarrhea or constipation.  Denies any fevers        or chills.  Denies any chest pain, shortness of breath.  Denies any ear pain, ringing in the ears or decreased        hearing.          Past Medical/Surgical History:  Diagnosis                                                                     Date        *   Hypertension                                                                                                 *          Hyperlipidemia        History reviewed. No pertinent past surgical history.          Medications:  No current facility-administered medications on file prior to encounter.            Current Outpatient Prescriptions on File Prior to Encounter        Medication                                  Sig                                     Dispense          Refill        *   LISINOPRIL PO                           Take  by mouth.            Review of Systems  Constitutional: Negative for fever and chills.  HENT: Negative for neck pain and neck        stiffness.  Eyes: Negative for photophobia and visual disturbance.  Respiratory: Negative for shortness of breath.         Cardiovascular: Negative for chest pain.  Gastrointestinal: Positive for nausea . Negative for vomiting,        abdominal pain, diarrhea and constipation.  Neurological: Positive for dizziness . Negative for syncope, facial        asymmetry, speech difficulty,  weakness, light-headedness, numbness and headaches.  All other systems reviewed and        are negative.          Physical Exam  Nursing note and vitals reviewed.  Constitutional: She is oriented to person, place, and time. She        appears well-developed and well-nourished.  HENT:        Head: Normocephalic. Right Ear: Hearing, tympanic membrane, external ear and ear canal normal. Left Ear: Hearing,        external ear and ear canal normal. No foreign bodies. Tympanic membrane is not injected, not perforated, not        erythematous and not retracted. No hemotympanum.  Nose: Nose normal. Left TM abnormal.  Eyes: Conjunctivae are        normal. Pupils are equal, round, and reactive to light. Right eye exhibits no discharge. Left eye exhibits no        discharge. Right eye exhibits normal extraocular motion and no nystagmus. Left eye exhibits nystagmus (with        looking left) . Left eye exhibits normal extraocular motion. Right pupil is round and reactive. Left pupil is        round and reactive. Pupils are equal.  Neck: Normal range of motion. Neck supple.  Cardiovascular: Normal rate and        regular rhythm.  Exam reveals no gallop and no friction rub.   Murmur heard.  Pulmonary/Chest: Effort normal and        breath sounds normal. No respiratory distress. She has no wheezes. She has no rales.  Abdominal: Soft. Bowel        sounds are normal. She exhibits no distension and no mass. There is no tenderness. There is no rebound and no        guarding. Musculoskeletal: Normal range of motion. Neurological: She is alert and oriented to person, place, and        time. She has normal strength. She is not disoriented. No cranial nerve deficit or sensory deficit. Coordination        normal. GCS eye subscore is 4 . GCS verbal subscore is 5 . GCS motor subscore is 6 . Negative pronator drift,        normal finger to nose, cranial nerves II through XII intact.  Skin: Skin is warm and dry. She is not diaphoretic.         No pallor. Psychiatric: She has a normal mood and affect. Her behavior is normal.          Procedures          MDM  MEDICAL DECISION MAKING          Vitals:    Filed Vitals:                            09/06/12 1449           09/06/12 1452           09/06/12 1500           09/06/12 1530        BP:               136/52                  145/63                  132/62                  137/55        Pulse:            66                      67        Temp:        TempSrc:        Resp:        Height:        Weight:        SpO2:          LABS :   Labs Reviewed  CBC WITH AUTO        DIFFERENTIAL - Abnormal; Notable for the following:                                                      RBC                                                           3.62 (*)                                                    All        other components within normal limits                                                                      BASIC        METABOLIC PANEL        PROTIME-INR        APTT            Remainder of labs reviewed and were negative at this time or not returned at the time of this note.          Orders:  Orders Placed This Encounter                                                                                              Procedures                                                                                                                *    CT        Head WO Contrast                                                                                           *  CBC Auto Differential                                                                                         *        Basic Metabolic Panel                                                                                         *        Protime-INR                                                                                                   *         APTT                                                                                                          *        Telemetry monitoring                                                                                          *        Orthostatic blood pressure and pulse                                                                          *  EKG 12 lead                                                                                                   *        Saline lock IV          EKG : Interpreted by the attending and myself in the absence of a real-time interpretation by the cardiologist.        EKG as read by myself in absence of cardiology shows normal sinus rhythm, ventricular rate of 64, normal partner        will encourage duration, QTC 408.  Normal axis.  No sign of acute ischemia.  No old EKG to compare.          X-RAYS : The attending, Tonna Boehringer, MD , and I have viewed the radiologic plain film image(s).  The plain films        will be read or overread by the radiologist.  ALL OTHER NON-PLAIN FILM IMAGES SUCH AS CT, ULTRASOUND AND MRI HAVE        BEEN READ BY THE RADIOLOGIST.  CT HEAD WO CONTRAST                                                                                                           Final Result:                           IMPRESSION:                                               1. No acute intracranial abnormality.                                             2. Age related changes as described above.         MEDICAL DECISION MAKING / ED COURSE :   Patient was given the following medications:  Medications  meclizine        (ANTIVERT) tablet 12.5 mg (12.5 mg Oral Given 09/06/12 1336)        ondansetron (ZOFRAN-ODT) disintegrating tablet 4 mg (4 mg Oral Given 09/06/12 1336)        0.9 % sodium chloride bolus (0 mLs Intravenous Stop Time 09/06/12 1545)         ondansetron (ZOFRAN) injection 4 mg (4 mg Intravenous Given 09/06/12 1410)        meclizine (ANTIVERT) tablet 25 mg (25 mg Oral Given 09/06/12 1505)        Patient presents to the emergency department after a being dizzy since last night. Patient remains stable        throughout ED course.  Patient does have left ear TM abnormality and cerumen, right ear canal clear and TM normal.         Consider patient's vertigo secondary to left ear infection.  Irrigation was performed with minimal cerumen        removed.  Patient will be started on Cortisporin ear drops for left ear.  Patient has a normal EKG.  Patient        afebrile and nontoxic in appearance.  At this time no signs that indicate cardiac arrhythmia, TIA/CVA,        dehydration, hypoglycemia, SAH or orthostatic hypotension. Patient does feel better after oral Zofran and        meclizine, however patient did have an episode of emesis two minutes after receiving this medication. IV fluids,        IV Zofran given. Patient re-dosed with Antivert. Normal head CT.          Patient without symptoms prior to discharge. Patient is not orthostatic. Patient vitals stable prior to discharge.        Patient given vertigo instructions. Patient verbally agreed to and understood plan of care both during ED course        and for after discharge home. Patient stable for outpatient follow-up.          The patient tolerated their visit well.  They were seen and evaluated by the attending physician who agreed with        the assessment and plan.  The patient and / or the family were informed of the results of any tests, a time was        given to answer questions, a plan was proposed and they agreed with plan.          CLINICAL IMPRESSION:          1.  Benign paroxysmal positional vertigo, unspecified laterality        2.  Cerumen impaction, left          DISPOSITION          PATIENT REFERRED TO: Ranelle Oyster          Schedule an appointment as soon as possible for a  visit            Jones Skene, MD [address] [place] [address] [phone]            Ear Nose Throat Specialist          DISCHARGE MEDICATIONS :  Discharge Medication List as of 09/06/2012  3:31 PM            START taking these medications  Details        meclizine (ANTIVERT) 25 MG tablet       Take 1 tablet by mouth 3 times daily as needed for 10 days., Disp-30                                                tablet, R-0          ondansetron (ZOFRAN ODT) 4 MG           Take 1-2 tablets by mouth every 12 hours as needed for Nausea. May Sub        disintegrating tablet                   regular tablet (non-ODT) if insurance does not cover ODT., Disp-12                                                tablet, R-0              (Please note the MDM and HPI sections of this note were completed with a voice recognition program.  Efforts were        made to edit the dictations but occasionally words are mis-transcribed.)          Tracee ONEOK, PA              Alesia Morin, PA 09/06/12 1635        Cosigned by Tonna Boehringer, MD at 09/07/2012  7:31 AM    ________________________________________________________________________________________________________________________    ED Medication Orders  ________________________________________________________________________________________________________________________     Start                                                               Status                 Ordering Provider        ________________________________________________________________________________________________________________________     09/06/12                                                            Last MAR action: Given MAUPIN, EMILY     1427         meclizine (ANTIVERT) tablet 25 mg ONCE                 - by Shelby Mattocks on 09/06/12 at  1505     09/06/12                                                            Last MAR action: Stop  TEWELL, TRACEE N     1343         0.9 % sodium chloride bolus ONCE                       Time - by Shelby Mattocks on                                                                         09/06/12 at 1545     09/06/12                                                            Last MAR action: Given TEWELL, TRACEE N     1343         ondansetron (ZOFRAN) injection 4 mg ONCE               - by Shelby Mattocks on 09/06/12 at                                                                         1410     09/06/12                                                            Last MAR action: Given TEWELL, TRACEE N  1333         meclizine (ANTIVERT) tablet 12.5 mg ONCE               - by Shelby Mattocks on 09/06/12 at                                                                         1336     09/06/12                                                            Last MAR action: Given TEWELL, TRACEE N     1333         ondansetron (ZOFRAN-ODT) disintegrating tablet 4 mg    - by Galvin Proffer on 09/06/12 at                                                                         1336  ________________________________________________________________________________________________________________________    Lab Results  ________________________________________________________________________________________________________________________    ________________________________________________________________________________________________________________________     CBC  Auto Differential (Final result)                       Abnormal                Component (Lab Inquiry)  ________________________________________________________________________________________________________________________          Collection Time  Result Time      WBC     RBC     HGB     HCT     MCV     MCH     MCHC    RDW     PLT     MPV        09/06/12         09/06/12         4.7     3.62    12.0    36.1    99.8  33.3    33.3    12.6    143     8.8        13:05:00         14:22:00                 (L)          Collection Time  Result Time      NEUTRO  LYMPHO  MONO    EOS     BASOS   NEUTRO  LYMPHS  MONOS   EOS ABS BASOS                                          PCT     PCT     PCT             PCT     ABS     ABS     ABS             ABS        09/06/12         09/06/12         68.0    23.7    7.1     0.8     0.4     3.2     1.1     0.3     0.0     0.0        13:05:00         14:22:00      ________________________________________________________________________________________________________________________    ________________________________________________________________________________________________________________________    ________________________________________________________________________________________________________________________     Basic Metabolic Panel (Final result)                                               Component (Lab Inquiry)  ________________________________________________________________________________________________________________________          Collection Time  Result Time      NA      K       CL      CO2     GLUCOSE BUN     CREATIN GFR     GFR     CALCI                                                                                          INE     Non-Afr African UM  ican    [place] n                                                                                                  n        09/06/12         09/06/12         141     3.8     105     30      94      16      0.9     >60     >60     9.4        13:05:00         14:38:00                                                                 >60     Chronic                                                                                                  mL/min/ Kidney                                                                                                  1.6m2  Disease  EGFR,   : less                                                                                                  calc.   than 60                                                                                                  for     ml/min/                                                                                                  ages 28 1.73                                                                                                  and     sq.m.                                                                                                  older  using   Kidney                                                                                                  the     Failure                                                                                                  MDRD    : less                                                                                                  formula than 15                                                                                                  (not    ml/min/                                                                                                  correct 1.73  ed for  sq.m.                                                                                                  weight) Results                                                                                                  , is    valid                                                                                                  valid   for                                                                                                  for     patient                                                                                                  stable  s 20  renal   years                                                                                                  functio and                                                                                                  n.      older.      ________________________________________________________________________________________________________________________    ________________________________________________________________________________________________________________________    ________________________________________________________________________________________________________________________     Protime-INR (Final result)                                                         Component (Lab Inquiry)  ________________________________________________________________________________________________________________________          Collection Time   Result Time       Protime                                 INR        09/06/12 13:05:00 09/06/12 14:35:00 11.0                                    0.98                                                                                    Normal: 0.85 - 1.15 Therapeutic: 2.0                                                                                    - 3.0  Pros. Valve: 2.5 - 3.5      ________________________________________________________________________________________________________________________    ________________________________________________________________________________________________________________________    ________________________________________________________________________________________________________________________     APTT (Final result)  Component (Lab Inquiry)  ________________________________________________________________________________________________________________________          Collection Time   Result Time       aPTT        09/06/12 13:05:00 09/06/12 14:35:00 34.3                                            Therapeutic range: 54.0 - 90.0 sec    ________________________________________________________________________________________________________________________    ________________________________________________________________________________________________________________________    Imaging Results  ________________________________________________________________________________________________________________________    ________________________________________________________________________________________________________________________     CT Head WO Contrast (Final result)                                                 Result time: 09/06/12 14:53:51  ________________________________________________________________________________________________________________________     Final result by Augusto Gamble, MD (09/06/12 14:53:51)  ________________________________________________________________________________________________________________________     Impression:     IMPRESSION:       1. No acute intracranial abnormality.     2. Age related changes as described  above.  ________________________________________________________________________________________________________________________     Narrative:     HEAD CT         INDICATION: Dizziness       COMPARISON: None available.       FINDINGS:       Brain parenchyma:     Mild periventricular white matter disease.     Mild atrophy.     No masses, hemorrhages, or infarcts are identified.       Ventricles and sulci: Negative.       Extra-axial space: Negative.       Intracranial vasculature: Vascular calcifications noted. Otherwise     negative.       Calvarium: Intact.       Visualized paranasal sinuses: Clear.     Visualized orbits: Negative.     Visualized mastoids: Clear.       Extracranial soft tissues: Negative.    ________________________________________________________________________________________________________________________    ________________________________________________________________________________________________________________________    ED Medication Administration from 09/06/2012 1250 to 09/07/2012 0731  ________________________________________________________________________________________________________________________        Date/Time      Order                          Dose     Route    Action           Action by             Comments    ________________________________________________________________________________________________________________________        09/06/2012     0.9 % sodium chloride bolus    500 mL   Intraven Given            Shelby Mattocks,        [place]                                                   ous                       RN        09/06/2012  0.9 % sodium chloride bolus    0 mL     Intraven Stop Time        Shelby Mattocks,        1545                                                   ous                       RN        09/06/2012     meclizine (ANTIVERT) tablet    12.5 mg  Oral     Given            Shelby Mattocks,        1336           12.5 mg                                                            RN        09/06/2012     meclizine (ANTIVERT) tablet 25 25 mg    Oral     Given            Shelby Mattocks,        1505           mg                                                                RN        09/06/2012     ondansetron (ZOFRAN) injection 4 mg     Intraven Given            Shelby Mattocks,        1410           4 mg                                    ous                       RN        09/06/2012     ondansetron (ZOFRAN-ODT)       4 mg     Oral     Given            Shelby Mattocks,        1336           disintegrating tablet 4 mg                                        RN  ________________________________________________________________________________________________________________________    ED Treatment Team  ________________________________________________________________________________________________________________________     Provider  Role                    From              To                Phone             Pager  ________________________________________________________________________________________________________________________     Tonna Boehringer, MD        Attending Provider      09/06/12 1257     --                [phone]     Alesia Morin, PA     Physician Assistant     09/06/12 1257     --  ________________________________________________________________________________________________________________________    Code Onset/Outcome  ________________________________________________________________________________________________________________________     None  ________________________________________________________________________________________________________________________    Code Interventions/Drips/Airways  ________________________________________________________________________________________________________________________      None  ________________________________________________________________________________________________________________________    bmk Diagnoses  ________________________________________________________________________________________________________________________     Benign paroxysmal positional vert

## 2012-09-06 NOTE — Unmapped (Signed)
________________________________________________________________________________________________________________________       Your patient was seen at a [facility]. Please go to     [email or web address] to view information filed to your patient's chart in Epic.       If you need to view your patient's results prior to gaining access to Epic CareLink, please contact the [facility] where your patient was seen.    ________________________________________________________________________________________________________________________          Stephanie Suarez [phone] ([id number])  (79 y.o. F)   PCP: Gershon Mussel ED Arrival Information  ________________________________________________________________________________________________________________________     Expected Arrival            Acuity   Means of Arrival   Escorted By Service            Admission Type Arrival                                                                                                           Complaint  ________________________________________________________________________________________________________________________     -        09/06/2012 12:50 PM 3-Urgent Personal Transport -           Emergency Medicine Emergency      STOMACH PAIN  ________________________________________________________________________________________________________________________    bmk Chief Complaint  ________________________________________________________________________________________________________________________     Dizziness                                       c/o room spinning and nausea after waking up this AM  ________________________________________________________________________________________________________________________    ED Vitals  ________________________________________________________________________________________________________________________   Date/Time           Temp            Pulse           Resp            BP              SpO2            Weight       Who    ________________________________________________________________________________________________________________________     09/06/12 1452       --              67              --              145/63          --              --           RG     09/06/12 1449       --              66              --  136/52          --              --           RG     09/06/12 1414       --              63              19              126/59          100 %           --           FEK     09/06/12 1256       97.1 ??????F         70              16              142/59          99 %            133 lb       SH                         (36.2 ??????C)                                                                       (60.328 kg)  ________________________________________________________________________________________________________________________    bmk Allergies (verified on: 09/06/12)  ________________________________________________________________________________________________________________________     (No Known Allergies)  ________________________________________________________________________________________________________________________    [facility]k Medical History  ________________________________________________________________________________________________________________________     Past Medical History                            Date                    Comments  ________________________________________________________________________________________________________________________     Hypertension [401.9]     Hyperlipidemia [272.4]  ________________________________________________________________________________________________________________________    Surgical History  ________________________________________________________________________________________________________________________      None  ________________________________________________________________________________________________________________________    Obstetric History  ________________________________________________________________________________________________________________________     The patient has not been asked about pregnancy.  ________________________________________________________________________________________________________________________    ED Provider Notes  ________________________________________________________________________________________________________________________       Tonna Boehringer, MD   09/06/2012  3:19 PM        I have personally seen and examined this patient. I have fully participated in the care of this patient. I have        reviewed and agree with all pertinent clinical information including history, physical exam, diagnostic tests, and        the plan.  HPI: Stephanie Suarez presented with        Chief Complaint        Patient presents with        *   Dizziness                c/o room spinning and nausea after waking up this AM        Review of Systems: See PA note  Vital Signs: BP 145/63   Pulse 67   Temp(Src) 97.1 ??????F (36.2 ??????C) (Oral)   Resp 19   Ht 5' 3 (1.6 m)   Wt 133 lb        (60.328 kg)   BMI 23.57 kg/m2   SpO2 100%  Alert 79 y.o. female who does not appear toxic or acutely ill        HENT: Atraumatic, oral mucosa moist        Neck: Grossly normal ROM        Chest/Lungs: respiratory effort normal        HRRR        LCTAB        Patient alert and oriented.        GCS  15.        CN II-XII intact.        Clear speech.        Strength 5/5 globally        Musculoskeletal: Grossly normal ROM        Skin: No palor or diaphoresis        Medical Decision Making and Plan:        Pertinent Labs & Imaging studies reviewed. (See chart for details)        I evaluated this patient at 1420.          Patient with dizziness since last night, some nausea.  She is neurologically intact.  CT shows no  evidence of CVA        or TIA.  Likely vertigo.  Feeling better after meclizine.  Will discharge home with meclizine and Zofran. I agree        with PA Tewell assessment and plan.              Tonna Boehringer, MD 09/06/12 1519        ________________________________________________________________________________________________________________________    ED Medication Orders  ________________________________________________________________________________________________________________________     Start                                                               Status                 Ordering Provider        ________________________________________________________________________________________________________________________     09/06/12                                                            Last MAR action: Given MAUPIN, EMILY     1427         meclizine (ANTIVERT) tablet 25 mg ONCE                 - by Alford Highland  ELAINE on 09/06/12 at                                                                         1505     09/06/12                                                            Last MAR action: Given TEWELL, TRACEE N     1343         0.9 % sodium chloride bolus ONCE                       - by Shelby Mattocks on 09/06/12 at                                                                         1410     09/06/12                                                            Last MAR action: Given TEWELL, TRACEE N     1343         ondansetron (ZOFRAN) injection 4 mg ONCE               - by Shelby Mattocks on 09/06/12 at                                                                         1410  09/06/12                                                            Last MAR action:  Given TEWELL, TRACEE N     1333         meclizine (ANTIVERT) tablet 12.5 mg ONCE               - by Shelby Mattocks on 09/06/12 at                                                                         1336     09/06/12                                                            Last MAR action: Given TEWELL, TRACEE N     1333         ondansetron (ZOFRAN-ODT) disintegrating tablet 4 mg    - by Galvin Proffer on 09/06/12 at                                                                         1336  ________________________________________________________________________________________________________________________    Lab Results  ________________________________________________________________________________________________________________________    ________________________________________________________________________________________________________________________     CBC Auto Differential (Final result)                       Abnormal                Component (Lab Inquiry)  ________________________________________________________________________________________________________________________          Collection Time  Result Time      WBC     RBC     HGB     HCT  MCV     MCH     MCHC    RDW     PLT     MPV        09/06/12         09/06/12         4.7     3.62    12.0    36.1    99.8    33.3    33.3    12.6    143     8.8        13:05:00         14:22:00                 (L)          Collection Time  Result Time      NEUTRO  LYMPHO  MONO    EOS     BASOS   NEUTRO  LYMPHS  MONOS   EOS ABS BASOS                                          PCT     PCT     PCT             PCT     ABS     ABS     ABS             ABS        09/06/12         09/06/12         68.0    23.7    7.1     0.8     0.4     3.2     1.1     0.3     0.0     0.0        13:05:00          14:22:00      ________________________________________________________________________________________________________________________    ________________________________________________________________________________________________________________________    ________________________________________________________________________________________________________________________     Basic Metabolic Panel (Final result)                                               Component (Lab Inquiry)  ________________________________________________________________________________________________________________________          Collection Time  Result Time      NA      K       CL      CO2     GLUCOSE BUN     CREATIN GFR     GFR     CALCI                                                                                          INE     Non-Afr African UM  ican    [place] n                                                                                                  n        09/06/12         09/06/12         141     3.8     105     30      94      16      0.9     >60     >60     9.4        13:05:00         14:38:00                                                                 >60     Chronic                                                                                                  mL/min/ Kidney                                                                                                  1.28m2  Disease  EGFR,   : less                                                                                                  calc.   than 60                                                                                                   for     ml/min/                                                                                                  ages 41 1.73                                                                                                  and     sq.m.                                                                                                  older  using   Kidney                                                                                                  the     Failure                                                                                                  MDRD    : less                                                                                                  formula than 15                                                                                                  (not    ml/min/                                                                                                  correct 1.73  ed for  sq.m.                                                                                                  weight) Results                                                                                                  , is    valid                                                                                                  valid   for                                                                                                  for     patient                                                                                                  stable  s 18  renal   years                                                                                                  functio and                                                                                                   n.      older.      ________________________________________________________________________________________________________________________    ________________________________________________________________________________________________________________________    ________________________________________________________________________________________________________________________     Protime-INR (Final result)                                                         Component (Lab Inquiry)  ________________________________________________________________________________________________________________________          Collection Time   Result Time       Protime                                 INR        09/06/12 13:05:00 09/06/12 14:35:00 11.0                                    0.98                                                                                    Normal: 0.85 - 1.15 Therapeutic: 2.0                                                                                    - 3.0 Pros. Valve: 2.5 - 3.5      ________________________________________________________________________________________________________________________    ________________________________________________________________________________________________________________________    ________________________________________________________________________________________________________________________     APTT (Final result)  Component (Lab Inquiry)  ________________________________________________________________________________________________________________________          Collection Time   Result Time       aPTT        09/06/12 13:05:00 09/06/12 14:35:00 34.3                                            Therapeutic range: 54.0 - 90.0  sec    ________________________________________________________________________________________________________________________    ________________________________________________________________________________________________________________________    Imaging Results  ________________________________________________________________________________________________________________________    ________________________________________________________________________________________________________________________     CT Head WO Contrast (Final result)                                                 Result time: 09/06/12 14:53:51  ________________________________________________________________________________________________________________________     Final result by Augusto Gamble, MD (09/06/12 14:53:51)  ________________________________________________________________________________________________________________________     Impression:     IMPRESSION:       1. No acute intracranial abnormality.     2. Age related changes as described above.  ________________________________________________________________________________________________________________________     Narrative:     HEAD CT         INDICATION: Dizziness       COMPARISON: None available.       FINDINGS:       Brain parenchyma:     Mild periventricular white matter disease.     Mild atrophy.     No masses, hemorrhages, or infarcts are identified.       Ventricles and sulci: Negative.       Extra-axial space: Negative.       Intracranial vasculature: Vascular calcifications noted. Otherwise     negative.       Calvarium: Intact.       Visualized paranasal sinuses: Clear.     Visualized orbits: Negative.     Visualized mastoids: Clear.       Extracranial soft tissues:  Negative.    ________________________________________________________________________________________________________________________    ________________________________________________________________________________________________________________________    ED Medication Administration from 09/06/2012 1250 to 09/06/2012 1519  ________________________________________________________________________________________________________________________        Date/Time      Order                          Dose     Route    Action           Action by             Comments    ________________________________________________________________________________________________________________________        09/06/2012     0.9 % sodium chloride bolus    500 mL   Intraven Given            Shelby Mattocks,        [place]                                                   ous                       RN  09/06/2012     meclizine (ANTIVERT) tablet    12.5 mg  Oral     Given            Shelby Mattocks,        1336           12.5 mg                                                           RN        09/06/2012     meclizine (ANTIVERT) tablet 25 25 mg    Oral     Given            Shelby Mattocks,        1505           mg                                                                RN        09/06/2012     ondansetron (ZOFRAN) injection 4 mg     Intraven Given            Shelby Mattocks,        1410           4 mg                                    ous                       RN        09/06/2012     ondansetron (ZOFRAN-ODT)       4 mg     Oral     Given            Shelby Mattocks,        1336           disintegrating tablet 4 mg                                        RN  ________________________________________________________________________________________________________________________    ED Treatment  Team  ________________________________________________________________________________________________________________________     Provider                Role                    From              To                Phone             Pager  ________________________________________________________________________________________________________________________     Tonna Boehringer, MD        Attending Provider      09/06/12 1257     --  [phone]     Alesia Morin, PA     Physician Assistant     09/06/12 1257     --  ________________________________________________________________________________________________________________________    Code Onset/Outcome  ________________________________________________________________________________________________________________________     None  ________________________________________________________________________________________________________________________    Code Interventions/Drips/Airways  ________________________________________________________________________________________________________________________     None  ________________________________________________________________________________________________________________________    bmk Diagnoses  ________________________________________________________________________________________________________________________     Benign paroxysmal positional vertigo,     unspecified laterality     Cerumen impaction, left  ________________________________________________________________________________________________________________________    [facility] ED Prescriptions  ________________________________________________________________________________________________________________________     Medication                             Sig               Dispense    Start Date   End Date     Auth.  Provider  ________________________________________________________________________________________________________________________     meclizine (ANTIVERT) 25 MG tablet      Take 1 tablet by  30 tablet   09/06/2012    09/16/2012    Tracee N Tewell, PA                                            mouth 3 times                                            daily as needed                                            for 10 days.     ondansetron (ZOFRAN ODT) 4 MG          Take 1-2 tablets  12 tablet   09/06/2012                 Tracee N Tewell, PA     disintegrating tablet                  by mouth every 12                                            hours as needed                                            for Nausea. May                                            Sub regular                                            tablet (non-ODT)  if insurance does                                            not cover ODT.  ________________________________________________________________________________________________________________________    [facility] Follow-up Information  ________________________________________________________________________________________________________________________     Follow up With                Details                       Comments                      Contact Info  ________________________________________________________________________________________________________________________     Ranelle Oyster             Schedule an appointment as                                   soon as possible for a visit     Jones Skene, MD                                          [facility]    [address]                                                                                               [address]                                                                                               [place] [address]                                                                                                [phone]    ________________________________________________________________________________________________________________________    bmk Discharge Instructions  ________________________________________________________________________________________________________________________        Benign Positional Vertigo  Vertigo means you feel like you or your surroundings are moving when they are not. Benign positional vertigo is the most  common form of vertigo. Benign means that the cause of your condition is  not serious. Benign positional vertigo is more  common in older adults.  CAUSES  Benign positional vertigo is the result of an upset in the labyrinth system. This is an area in the middle ear that  helps control your balance. This may be caused by a viral infection, head injury, or repetitive motion. However, often  no specific cause is found.  SYMPTOMS  Symptoms of benign positional vertigo occur when you move your head or eyes in different directions. Some of the  symptoms may include:  *  Loss of balance and falls.  *  Vomiting.  *  Blurred vision.  *  Dizziness.  *  Nausea.  *  Involuntary eye movements (nystagmus).  DIAGNOSIS  Benign positional vertigo is usually diagnosed by physical exam. If the specific cause of your benign positional vertigo  is unknown, your caregiver may perform imaging tests, such as magnetic resonance imaging (MRI) or computed tomography  (CT).  TREATMENT  Your caregiver may recommend movements or procedures to correct the benign positional vertigo. Medicines such as  meclizine, benzodiazepines, and medicines for nausea may be used to treat your symptoms. In rare cases, if your symptoms  are caused by certain conditions that affect the inner ear, you may need surgery.  HOME CARE INSTRUCTIONS  *  Follow your caregiver's instructions.  *  Move slowly. Do not make sudden body or head movements.  *  Avoid driving.  *  Avoid  operating heavy machinery.  *  Avoid performing any tasks that would be dangerous to you or others during a vertigo episode.  *  Drink enough fluids to keep your urine clear or pale yellow.  SEEK IMMEDIATE MEDICAL CARE IF:  *  You develop problems with walking, weakness, numbness, or using your arms, hands, or legs.  *  You have difficulty speaking.  *  You develop severe headaches.  *  Your nausea or vomiting continues or gets worse.  *  You develop visual changes.  *  Your family or friends notice any behavioral changes.  *  Your condition gets worse.  *  You have a fever.  *  You develop a stiff neck or sensitivity to light.  MAKE SURE YOU:  *  Understand these instructions.  *  Will watch your condition.  *  Will get help right away if you are not doing well or get worse.  Document Released: 12/18/2005 Document Revised: 06/04/2011 Document Reviewed: 11/30/2010  ExitCare?????? Patient Information ??????[address], [place].        Cerumen Impaction  A cerumen impaction is when the wax in your ear forms a plug. This plug usually causes reduced hearing. Sometimes it  also causes an earache or dizziness. Removing a cerumen impaction can be difficult and painful. The wax sticks to the  ear canal. The canal is sensitive and bleeds easily. If you try to remove a heavy wax buildup with a cotton tipped swab,  you may push it in further.  Irrigation with water, suction, and small ear curettes may be used to clear out the wax. If the impaction is fixed to  the skin in the ear canal, ear drops may be needed for a few days to loosen the wax. People who build up a lot of wax  frequently can use ear wax removal products available in your local drugstore.  SEEK MEDICAL CARE IF:  You develop an earache, increased hearing loss, or marked dizziness.  Document Released: 04/19/2004 Document Revised: 06/04/2011 Document  Reviewed: 06/09/2009  ExitCare?????? Patient Information ??????[address], [place].          This document has removed original  colors, tables and or formatting to be viewed in this system and is therefore  not the legal report and should not be relied on as the official report.  If there are any questions, the legal  report in the originating system should be viewed and used as the authoritative result.

## 2012-09-07 LAB — EKG 12-LEAD
Atrial Rate: 64 {beats}/min
P Axis: 74 degrees
P-R Interval: 166 ms
Q-T Interval: 396 ms
QRS Duration: 72 ms
QTc Calculation (Bazett): 408 ms
R Axis: 58 degrees
T Axis: 61 degrees
Ventricular Rate: 64 {beats}/min

## 2012-10-30 MED ORDER — lisinopril-hydrochlorothiazide (PRINZIDE,ZESTORETIC) 20-25 mg per tablet
20-25 | ORAL_TABLET | Freq: Every day | ORAL | 0.00 refills | 90.00000 days | Status: AC
Start: 2012-10-30 — End: ?

## 2012-10-30 NOTE — Unmapped (Signed)
Refill on Lisinopril -HCTZ tab 20/25 mg. One tablet by mouth daily

## 2012-11-21 ENCOUNTER — Ambulatory Visit: Admit: 2012-11-21 | Payer: Medicare (Managed Care)

## 2012-11-21 DIAGNOSIS — H6120 Impacted cerumen, unspecified ear: Secondary | ICD-10-CM

## 2012-11-21 NOTE — Unmapped (Signed)
Subjective  HPI:   Patient ID: Stephanie Suarez is a 79 y.o. female.    Chief Complaint:  HPI     Stephanie Suarez c/o wax in Stephanie Suarez right ear. Stephanie Suarez home care nurse recommended Stephanie Suarez use peroxide which Stephanie Suarez has done. Stephanie Suarez did note mild hearing loss. Stephanie Suarez denies pain.  Stephanie Suarez was also told that Stephanie Suarez right collar bone was more prominent.  Stephanie Suarez denies any pain or swelling.     Stephanie Suarez would like to get Stephanie Suarez flu shot today, questions about shingles vaccine.  Immunization History   Administered Date(s) Administered   ??? Influenza Preservative 12/19/2011   ??? Pneumococcal Polysaccharide 08/01/2006   ??? influenza unspec 02/11/2009, 02/09/2010     Past medical history, medications, allergies and problem visit reviewed prior to visit.  Past Medical History   Diagnosis Date   ??? Environmental allergies    ??? Hyperlipidemia    ??? Hypertension    ??? Adenoma 08/08     on colonoscopy august 2008     No past surgical history on file.  Family History   Problem Relation Age of Onset   ??? Other Mother      peripartum death   ??? Prostate cancer Father    ??? Alzheimer's disease Sister    ??? Dementia Sister    ??? Suicidality Daughter    ??? Heart attack Son 31   ??? Hypertension Son    ??? Schizophrenia Son    ??? Sleep apnea Son       Prior to Admission medications    Medication Sig Start Date End Date Taking? Authorizing Provider   ascorbic acid (VITAMIN C) 500 MG tablet Take 500 mg by mouth daily.   02/11/09  Yes Historical Provider, MD   lisinopril-hydrochlorothiazide (PRINZIDE,ZESTORETIC) 20-25 mg per tablet Take 0.5 tablets by mouth daily. 10/30/12  Yes Aldrin Engelhard J. Wilson Singer, MD   vit b complex w-b 12 (VITAMIN B COMPLEX) tablet Take 1 tablet by mouth daily.     Yes Historical Provider, MD     No Known Allergies  Social history- reports that Stephanie Suarez has never smoked. Stephanie Suarez does not have any smokeless tobacco history on file. Stephanie Suarez reports that Stephanie Suarez does not drink alcohol or use illicit drugs.   ROS:   Review of Systems   Constitutional: Negative for fever and fatigue.   HENT: Positive for hearing loss.  Negative for ear pain, congestion, rhinorrhea, tinnitus and ear discharge.    Respiratory: Negative for cough.      Objective:   Physical Exam   Constitutional: Stephanie Suarez appears well-developed and well-nourished.   HENT:   Head: Normocephalic and atraumatic.   Right Ear: Tympanic membrane and external ear normal.   Left Ear: Tympanic membrane, external ear and ear canal normal.   Nose: Nose normal.   Mouth/Throat: Uvula is midline, oropharynx is clear and moist and mucous membranes are normal. No oropharyngeal exudate.   Cerumen impaction in right ear-removed manually with a curette   Eyes: Conjunctivae and EOM are normal. Pupils are equal, round, and reactive to light. Right eye exhibits no discharge. Left eye exhibits no discharge.   Neck: Neck supple. No thyromegaly present.   Cardiovascular: Normal rate, regular rhythm and normal heart sounds.    No murmur heard.  Pulmonary/Chest: Effort normal. No respiratory distress. Stephanie Suarez has no wheezes. Stephanie Suarez has no rales. Stephanie Suarez exhibits no tenderness.   Musculoskeletal: Normal range of motion.   Lymphadenopathy:     Stephanie Suarez has no cervical adenopathy.   Skin:  Skin is warm and dry.   Psychiatric: Stephanie Suarez has a normal mood and affect. Stephanie Suarez behavior is normal. Judgment and thought content normal.         Filed Vitals:    11/21/12 1601   BP: 130/80   Pulse: 72   Height: 5' 3 (1.6 m)   Weight: 129 lb (58.514 kg)     Body mass index is 22.86 kg/(m^2).  Body surface area is 1.61 meters squared.     Assessment/Plan:     Stephanie Suarez was seen today for poss swollen neck and cerumen impaction.    Diagnoses and associated orders for this visit:    Hearing loss secondary to cerumen impaction  -    Removed with curette, with resolution of symptoms    Screening for depression  -    Patient screened for depression:  Do you feel any of the following: Sad, trouble sleeping, unexplained crying: No Problem  Do you have current, recent thought that you would be better off dead or of hurting yourself?: No    Need for  prophylactic vaccination and inoculation against influenza  - Flu vaccine, Fluzone (medicare), High risk  Rec: Varivax vaccine at pharmacy.  F/u prn

## 2013-06-02 ENCOUNTER — Inpatient Hospital Stay: Admit: 2013-06-02 | Discharge: 2013-06-02 | Attending: Emergency Medicine

## 2013-06-02 LAB — URINALYSIS
Bilirubin Urine: NEGATIVE
Blood, Urine: NEGATIVE
Glucose, Ur: NEGATIVE mg/dL
Ketones, Urine: NEGATIVE mg/dL
Leukocyte Esterase, Urine: NEGATIVE
Nitrite, Urine: NEGATIVE
Protein, UA: NEGATIVE mg/dL
Specific Gravity, UA: 1.02 (ref 1.005–1.030)
Urobilinogen, Urine: 0.2 E.U./dL (ref <2.0)
pH, UA: 7 (ref 5.0–8.0)

## 2013-06-02 LAB — COMPREHENSIVE METABOLIC PANEL
ALT: 10 U/L (ref 10–40)
AST: 15 U/L (ref 15–37)
Albumin/Globulin Ratio: 1.2 (ref 1.1–2.2)
Albumin: 4 g/dL (ref 3.4–5.0)
Alkaline Phosphatase: 60 U/L (ref 40–129)
Anion Gap: 13 (ref 3–16)
BUN: 15 mg/dL (ref 7–20)
CO2: 27 mmol/L (ref 21–32)
Calcium: 9.6 mg/dL (ref 8.3–10.6)
Chloride: 103 mmol/L (ref 99–110)
Creatinine: 0.9 mg/dL (ref 0.6–1.2)
GFR African American: >60 (ref >60)
GFR Non-African American: 60 — AB (ref >60)
Globulin: 3.4 g/dL
Glucose: 94 mg/dL (ref 70–99)
Potassium: 4.2 mmol/L (ref 3.5–5.1)
Sodium: 143 mmol/L (ref 136–145)
Total Bilirubin: 0.3 mg/dL (ref 0.0–1.0)
Total Protein: 7.4 g/dL (ref 6.4–8.2)

## 2013-06-02 LAB — TROPONIN: Troponin: <0.01 ng/mL (ref <0.01)

## 2013-06-02 LAB — CBC
Hematocrit: 40.2 % (ref 36.0–48.0)
Hemoglobin: 13.4 g/dL (ref 12.0–16.0)
MCH: 34 pg (ref 26.0–34.0)
MCHC: 33.3 g/dL (ref 31.0–36.0)
MCV: 102.3 fL — ABNORMAL HIGH (ref 80.0–100.0)
MPV: 9.3 fL (ref 5.0–10.5)
Platelets: 157 10*3/uL (ref 135–450)
RBC: 3.93 M/uL — ABNORMAL LOW (ref 4.00–5.20)
RDW: 13.4 % (ref 12.4–15.4)
WBC: 6.2 10*3/uL (ref 4.0–11.0)

## 2013-06-02 LAB — LIPASE: Lipase: 53 U/L (ref 13.0–60.0)

## 2013-06-02 LAB — AMYLASE: Amylase: 162 U/L — ABNORMAL HIGH (ref 25–115)

## 2013-06-02 MED ORDER — ONDANSETRON HCL 4 MG/2ML IJ SOLN
4 | INTRAMUSCULAR | Status: DC | PRN
Start: 2013-06-02 — End: 2013-06-02
  Administered 2013-06-02: 17:00:00 4 mg via INTRAVENOUS

## 2013-06-02 MED ORDER — SODIUM CHLORIDE 0.9 % IV BOLUS
0.9 % | Freq: Once | INTRAVENOUS | Status: AC
Start: 2013-06-02 — End: 2013-06-02
  Administered 2013-06-02: 17:00:00 500 mL via INTRAVENOUS

## 2013-06-02 MED ORDER — ONDANSETRON 4 MG PO TBDP
4 | ORAL_TABLET | Freq: Two times a day (BID) | ORAL | Status: DC | PRN
Start: 2013-06-02 — End: 2013-06-18

## 2013-06-02 MED FILL — ONDANSETRON HCL 4 MG/2ML IJ SOLN: 4 MG/2ML | INTRAMUSCULAR | Qty: 2

## 2013-06-02 MED FILL — SODIUM CHLORIDE 0.9 % IV SOLN: 0.9 % | INTRAVENOUS | Qty: 500

## 2013-06-02 NOTE — ED Provider Notes (Signed)
I have personally performed a face to face diagnostic evaluation on this patient. I have fully participated in the care of this patient. I have reviewed and agree with all pertinent clinical information including history, physical exam, diagnostic tests, and the plan.     History and Physical as follows:  80 year old female here for evaluation of dizziness, nausea, vomiting.  She describes it as a vertigo or spinning sensation.  No pain complaints.  No headaches or chest pain.  No syncope.  No strokelike symptoms.  Dizziness was without any numbness, tingling, weakness.  No confusion or facial droop.  No slurred speech.  On physical exam she is awake and alert, oriented ??3.  No facial droop.  Pupils are equal and reactive.  Strength is equal bilaterally.  Speech is clear.    EKG reviewed by myself: Sinus bradycardia.  Rate of 59.  Normal axis.  No significant change when compared to previous EKG from September 06, 2012.    Evaluation today is most consistent with benign dizziness, nausea, vomiting.  They're concerned that her symptoms may be related to something she ate.  Nothing to suggest stroke or TIA.  Nothing to suggest an acute cardiac event.    Clarise Cruz, MD  06/04/13 1300

## 2013-06-02 NOTE — ED Notes (Signed)
Discharge instructions given to patient. Verbalized understanding.    Pamelia Hoit, RN  06/02/13 1524

## 2013-06-02 NOTE — Discharge Instructions (Signed)
Gastroenteritis: After Your Visit  Your Care Instructions  Gastroenteritis is an illness that may cause nausea, vomiting, and diarrhea. It is sometimes called "stomach flu." It can be caused by bacteria or a virus.  You will probably begin to feel better in 1 to 2 days. In the meantime, get plenty of rest and make sure you do not become dehydrated. Dehydration occurs when your body loses too much fluid.  Follow-up care is a key part of your treatment and safety. Be sure to make and go to all appointments, and call your doctor if you are having problems. It's also a good idea to know your test results and keep a list of the medicines you take.  How can you care for yourself at home?   If your doctor prescribed antibiotics, take them as directed. Do not stop taking them just because you feel better. You need to take the full course of antibiotics.   Drink plenty of fluids to prevent dehydration, enough so that your urine is light yellow or clear like water. Choose water and other caffeine-free clear liquids until you feel better. If you have kidney, heart, or liver disease and have to limit fluids, talk with your doctor before you increase your fluid intake.   Drink fluids slowly, in frequent, small amounts, because drinking too much too fast can cause vomiting.   Begin eating mild foods, such as dry toast, yogurt, applesauce, bananas, and rice. Avoid spicy, hot, or high-fat foods, and do not drink alcohol or caffeine for a day or two. Do not drink milk or eat ice cream until you are feeling better.  How to prevent gastroenteritis   Keep hot foods hot and cold foods cold.   Do not eat meats, dressings, salads, or other foods that have been kept at room temperature for more than 2 hours.   Use a thermometer to check your refrigerator. It should be between 34F and 40F.   Defrost meats in the refrigerator or microwave, not on the kitchen counter.   Keep your hands and your kitchen clean. Wash your hands,  cutting boards, and countertops with hot soapy water frequently.   Cook meat until it is well done.   Do not eat raw eggs or uncooked sauces made with raw eggs.   Do not take chances. If food looks or tastes spoiled, throw it out.  When should you call for help?  Call 911 anytime you think you may need emergency care. For example, call if:   You vomit blood or what looks like coffee grounds.   You passed out (lost consciousness).   You pass maroon or very bloody stools.  Call your doctor now or seek immediate medical care if:   You have severe belly pain.   You have signs of needing more fluids. You have sunken eyes, a dry mouth, and pass only a little dark urine.   You feel like you are going to faint.   You have increased belly pain that does not go away in 1 to 2 days.   You have new or increased nausea, or you are vomiting.   You have a new or higher fever.   Your stools are black and tarlike or have streaks of blood.  Watch closely for changes in your health, and be sure to contact your doctor if:   You are dizzy or lightheaded.   You urinate less than usual, or your urine is dark yellow or brown.   You do   not feel better with each day that goes by.   Where can you learn more?   Go to [email or web address] and sign in to your MyChart account. Enter N142 in the [place] box to learn more about "Gastroenteritis: After Your Visit."    If you do not have an account, please click on the "Sign Up Now" link.      2006-2015 [employer]. Care instructions adapted under license by [facility]. This care instruction is for use with your licensed healthcare professional. If you have questions about a medical condition or this instruction, always ask your healthcare professional. [place] any warranty or liability for your use of this information.  Content Version: 10.4.390249; Current as of: February 06, 2013      Your nausea and  vomiting possibly related to food or virus.  Dizziness related to the whole event process.  If you should develop headache, return of dizziness or spinning, chest pain or shortness of breath return to the emergency room.  Use the Zofran to help control nausea if needed.  Advance diet as tolerated.  At time of discharge you had no more nausea.  We did give you Zofran 4 mg intravenously along with 500 cc of fluid to rehydrate.

## 2013-06-02 NOTE — ED Provider Notes (Signed)
[facility]  Emergency Department Encounter    Pt Name: Amber Palmer  MRN: [id number]  [place] 09-18-1929  Date of evaluation: 06/02/2013  Provider: Hazel Sams, PA    Chief Complaint   Patient presents with   ??? Nausea     n/v, dizzy started today.       Nursing Notes, Past Medical Hx, Past Surgical Hx, Social Hx, Allergies, and Family Hx were all reviewed and agreed with or any disagreements were addressed in the HPI.    HPI:  (Location, Duration, Timing, Severity, Quality, Assoc Sx, Context, Modifying factors)  This is a  80 y.o. female with complaint of nausea and vomiting ??1.  The patient states woke this morning felt a bit queasy and progressed to the point that she vomited ??1.  It was associated with slight dizziness and spinning-like sensation.  It was not accompanied by headache, chest pain or diarrhea.  She did not sustain a syncopal event.  She indicates she felt mildly short of breath this was transient and is not short of breath at this time.  There is no referred pain component or diaphoresis associated.  The son accompanies her.  He states that they ate some bad pork and that he had to throw it out and this he believes is the cause for his mother's nausea.  She has not had any recent respiratory symptoms.  She denies any dysuria, urgency or frequency.  At this time the patient feels nauseated and has urge to vomit.  She does not have chest pain, abdominal pain or shortness of breath at this time.    Past Medical/Surgical History:      Diagnosis Date   ??? Hypertension    ??? Hyperlipidemia      No past surgical history on file.    Medications:  Previous Medications    LISINOPRIL PO    Take  by mouth.    ONDANSETRON (ZOFRAN ODT) 4 MG DISINTEGRATING TABLET    Take 1-2 tablets by mouth every 12 hours as needed for Nausea. May Sub regular tablet (non-ODT) if insurance does not cover ODT.         Review of Systems   Constitutional: Negative for fever and chills.   Respiratory:  Negative for shortness of breath.         Transient shortness of breath this morning.   Cardiovascular: Negative for chest pain.   Gastrointestinal: Positive for nausea and vomiting. Negative for abdominal pain, diarrhea and constipation.   Genitourinary: Negative for dysuria and frequency.   Musculoskeletal: Negative for neck pain and neck stiffness.   Neurological: Positive for dizziness. Negative for syncope, weakness, numbness and headaches.        Transient dizziness this morning.   All other systems reviewed and are negative.        Physical Exam   Constitutional: She is oriented to person, place, and time. She appears well-developed and well-nourished.   HENT:   Head: Normocephalic and atraumatic.   Right Ear: Tympanic membrane, external ear and ear canal normal.   Left Ear: Tympanic membrane, external ear and ear canal normal.   Mouth/Throat: Oropharynx is clear and moist.   Eyes: Conjunctivae are normal. Right eye exhibits no discharge. Left eye exhibits no discharge. No scleral icterus.   Neck: Normal range of motion. Neck supple.   Cardiovascular: Normal rate, regular rhythm and normal heart sounds.    Pulmonary/Chest: Effort normal and breath sounds  normal.   Abdominal: Soft. Bowel sounds are normal. She exhibits no mass. There is no tenderness.   Musculoskeletal: Normal range of motion.   Neurological: She is alert and oriented to person, place, and time.   Skin: Skin is warm and dry.   Psychiatric: She has a normal mood and affect. Her behavior is normal. Judgment and thought content normal.   Nursing note and vitals reviewed.      Procedures    MDM    MEDICAL DECISION MAKING    Vitals:    Filed Vitals:    06/02/13 1322 06/02/13 1342 06/02/13 1402 06/02/13 1422   BP: 132/51 138/59 130/55 140/63   Pulse: 65 69 69 69   Temp:       TempSrc:       Resp:       Height:       Weight:       SpO2:  97%         LABS:   Labs Reviewed   COMPREHENSIVE METABOLIC PANEL - Abnormal; Notable for the following:     GFR  Non-African American 60 (*)     All other components within normal limits   CBC - Abnormal; Notable for the following:     RBC 3.93 (*)     MCV 102.3 (*)     All other components within normal limits   AMYLASE - Abnormal; Notable for the following:     Amylase 162 (*)     All other components within normal limits   LIPASE   URINALYSIS    Narrative:     no initials or time   TROPONIN        Remainder of labs reviewed and were negative at this time or not returned at the time of this note.    Orders:  Orders Placed This Encounter   Procedures   ??? XR Chest Portable   ??? Comprehensive Metabolic Panel   ??? CBC   ??? Amylase   ??? Lipase   ??? Urinalysis   ??? Troponin   ??? Ambulate to assess stability.   ??? EKG 12 Lead   ??? Saline lock IV       EKG: Interpreted by the attending and myself in the absence of a real-time interpretation by the cardiologist.   EKG as interpreted by myself and reviewed by my attending and the absence of an I&D cardiologist shows sinus bradycardia with a ventricular rate 59.  The PR interval at 166 ms and QRS duration and 72 ms.  No ectopy or acute changes.    RADIOLOGY:   Non-plain film images such as CT, Ultrasound and MRI are read by the radiologist. The attending, Clarise Cruz, MD, and I, Hazel Sams, PA, have visualized the radiologic plain film image(s) with the below findings:    Stat portable chest x-ray as viewed by myself and my attending as interpreted by the radiologist shows no acute cardiopulmonary process specifically no evidence of pneumonia or cardiac enlargement.    Interpretation per the Radiologist below, if available at the time of this note:    XR CHEST PORTABLE    Final Result: IMPRESSION:     No acute cardiopulmonary disease.                   Xr Chest Portable    06/02/2013   Chest, portable upright.  Jun 02, 2013 12:21:54 PM:   INDICATION:  "SOB, nausea vomiting."  COMPARISON:  11/12/2009       FINDINGS:  The cardiac silhouette is normal.  Calcified granuloma are noted in the right hilum and paratracheal region. There is moderate ossification of the thoracic aorta. Lungs are clear.          06/02/2013   IMPRESSION: No acute cardiopulmonary disease.                 PROCEDURES: N/A    CRITICAL CARE:  N/A    CONSULTATIONS: N/A    MEDICAL DECISION MAKING / ED COURSE:    Patient was given the following medications:  Medications   0.9 % sodium chloride bolus (0 mLs Intravenous Stopped 06/02/13 1524)       Patient presenting with nausea and vomiting ??2 since waking this morning.  She had a transient episode of dizziness this morning.  The son who accompanies his mother indicated that they may have ingested some bad pork because he felt ill as well and through the pork out.  The patient had laboratory evaluation showing no evidence of UTI.  The patient's renal function has GFR at 60.  Sodium potassium were within range.  The patient's WBC 6.2 with hemoglobin 13.4.  Amylase 162 and lipase 53.  Troponin 0.01.  Patient see EKG shows no acute changes.  Chest x-ray shows no evidence of pneumonia or other cardiopulmonary disease process.  Patient did receive 500 cc of saline with Zofran 4 mg intravenously.  The patient's nausea resolved.  The patient did ambulate without any subsequent dizziness or orthostasis.  She uses the restroom and return back with nursing aide.  The patient be discharged to home in the accompaniment of her son.  She was given instructions to increase fluids and right as tolerated.  She was given Zofran 4 mg taking one or 2 tablets every 6 or 8 hours as needed for nausea.  Advise follow with PCP in 3 days for recheck.  On reexamination the patient's abdomen was soft and an acute surgical abdomen was not evident.    The patient tolerated their visit well.  They were seen and evaluated by the attending physician who agreed with the assessment and plan.  The patient and / or the family were informed of the results of any tests, a time was given to  answer questions, a plan was proposed and they agreed with plan.      CLINICAL IMPRESSION:    1. Gastroenteritis        DISPOSITION Decision to Discharge    PATIENT REFERRED TO:  Ranelle OysterLeila Jane Saxena    Schedule an appointment as soon as possible for a visit in 3 days        DISCHARGE MEDICATIONS:  Discharge Medication List as of 06/02/2013  2:05 PM          (Please note the MDM and HPI sections of this note were completed with a voice recognition program.  Efforts were made to edit the dictations but occasionally words are mis-transcribed.)    Rick DuffMichael Carreen Milius, PA            Rick DuffMichael Katura Eatherly, [place]  06/02/13 [phone]

## 2013-06-02 NOTE — ED Notes (Signed)
Pt up to restroom with steady gait with assist of tech. No c/o.    Pamelia Hoit, RN  06/02/13 1441

## 2013-06-02 NOTE — Unmapped (Signed)
________________________________________________________________________________________________________________________       Your patient was seen at a [facility]. Please go to     [email or web address] to view information filed to your patient's chart in Epic.       If you need to view your patient's results prior to gaining access to Epic CareLink, please contact the [facility] where your patient was seen.            Stephanie Suarez, Stephanie Suarez #[id number] ([id number])  (80 y.o. F)   PCP: Tobie Poet DO        [facility] ED Arrival Information    ________________________________________________________________________________________________________________________     Expected         Arrival          Acuity           Means of Arrival Escorted By      Service          Admission Type     -                06/02/2013 11:08  3-Urgent         Personal         -                Emergency        Emergency                      AM                                Transport                         Medicine      ________________________________________________________________________________________________________________________     Arrival Complaint     NAUSEA WITH VOMITING    ________________________________________________________________________________________________________________________    ________________________________________________________________________________________________________________________  [facility] Chief Complaint  ________________________________________________________________________________________________________________________     Nausea                                          n/v, dizzy started today.    ________________________________________________________________________________________________________________________  ED  Vitals    ________________________________________________________________________________________________________________________     Date/Time           Temp            Pulse           Resp            BP              SpO2            Weight       Who       06/02/13 1422       --              69              --              140/63          --              --           CHW     06/02/13 1402       --  69              --              130/55          --              --           CHW     06/02/13 1342       --              69              --              138/59          97 %            --           CHW     06/02/13 1322       --              65              --              132/51          --              --           CHW     06/02/13 1302       --              65              --              140/58          --              --           CHW     06/02/13 1120       98.9 ??????F         66              18              177/70          97 %            130 lb       TS                         (37.2 ??????C)                                                                       (58.968 kg)    ________________________________________________________________________________________________________________________    ________________________________________________________________________________________________________________________  [facility] Allergies (verified on: 06/02/13)  ________________________________________________________________________________________________________________________     (No Known Allergies)    ________________________________________________________________________________________________________________________  [facility] Medical History    ________________________________________________________________________________________________________________________     Past Medical History                            Date  Comments     Hypertension [401.9]     Hyperlipidemia  [272.4]    ________________________________________________________________________________________________________________________    ________________________________________________________________________________________________________________________  Surgical History  ________________________________________________________________________________________________________________________     None    ________________________________________________________________________________________________________________________  Obstetric History  ________________________________________________________________________________________________________________________     The patient has not been asked about pregnancy.    ________________________________________________________________________________________________________________________  ED Provider Notes  ________________________________________________________________________________________________________________________       Adele Dan, MD   06/04/2013  1:00 PM          I have personally performed a face to face diagnostic evaluation on this patient. I have fully participated in the        care of this patient. I have reviewed and agree with all pertinent clinical information including history,        physical exam, diagnostic tests, and the plan.          History and Physical as follows: 80 year old female here for evaluation of dizziness, nausea, vomiting.  She        describes it as a vertigo or spinning sensation.  No pain complaints.  No headaches or chest pain.  No syncope.        No strokelike symptoms.  Dizziness was without any numbness, tingling, weakness.  No confusion or facial droop.        No slurred speech. On physical exam she is awake and alert, oriented ??????3.  No facial droop.  Pupils are equal and        reactive.  Strength is equal bilaterally.  Speech is clear.          EKG reviewed by myself: Sinus bradycardia.  Rate of 59.  Normal  axis.  No significant change when compared to        previous EKG from September 06, 2012.          Evaluation today is most consistent with benign dizziness, nausea, vomiting.  They're concerned that her symptoms        may be related to something she ate.  Nothing to suggest stroke or TIA.  Nothing to suggest an acute cardiac        event.          Adele Dan, MD 06/04/13 1300        ________________________________________________________________________________________________________________________    ED Medication Orders    ________________________________________________________________________________________________________________________     Start                                                               Status                 Ordering Provider           06/02/13                                                            Last MAR action:       BENTZ, MICHAEL     1210         0.9 % sodium chloride bolus ONCE  Stopped - by Stanford Breed on 06/02/13 at                                                                         1524    ________________________________________________________________________________________________________________________    ________________________________________________________________________________________________________________________  Lab Results  ________________________________________________________________________________________________________________________       Urinalysis (Final result)                                                           Component (Lab Inquiry)          Collection Time Result Time       COLOR UCLARITY GLUCOSE BILI UR KETONES SPEC    BLOOD U PH, UR  Protein Urobi        06/02/13        06/02/13 12:22:00 Yellow Clear   Negativ Negativ Negativ 1.020   Negativ 7.0     Negativ 0.2        12:14:00                                         e        e       e               e               e        Previous Results        08/23/08        08/23/08 07:28:00 YELLOW CLEAR   NEGATIV NEGATIV NEGATIV >=1.030 NEGATIV 5.5     NEGATIV 0.2        06:20:00                                         E       E       E               E               E            Collection Time      Result Time          NITRITELEUKOCYMicroscUrine  WBC UA RBC  UAEpi    BACTERIMUCUS  TES, URopic   Type                 Cells  A                                                                Examina                                                                tion        06/02/13 12:14:00    06/02/13 12:22:00    NegativNegativNot    Not                                                  e      e      IndicatSpecifi                                                                ed     ed        Previous Results          08/23/08 06:20:00    08/23/08 07:28:00    NEGATIVTRACE                6-10   Rare   3-5    Rare   1+                                                  E                           (H)      ________________________________________________________________________________________________________________________       Final result     Narrative:     no initials or time         Comprehensive Metabolic Panel (Final result)    Abnormal                            Component (Lab Inquiry)          Collection Time Result Time       NA     K       CL      CO2     ANION   GLUCOSE BUN     CREATIN GFR     GFR        06/02/13        06/02/13 12:02:00 143    4.2     103  27      13      94      15      0.9     60 (A)  >60        11:24:00                                                                                         >60     Chron        Previous Results        09/06/12        09/06/12 14:38:00 141    3.8     105     30              94      16      0.9     >60     >60        13:05:00                                                                                          >60     Chron            Collection Time      Result Time          CALCIUMPROTEINAlb    AlbuminBILI   ALK    ALT    AST    Globu                                                                       /GlobulTOTAL  PHOS                 lin                                                                       in                                                                       Ratio  06/02/13 11:24:00    06/02/13 12:02:00    9.6    7.4    4.0    1.2    0.3    60     10     15     3.4        Previous Results          09/06/12 13:05:00    09/06/12 14:38:00    9.4               CBC (Final result)    Abnormal                                                      Component (Lab Inquiry)          Collection Time  Result Time      WBC     RBC     HGB     HCT     MCV     MCH     MCHC    RDW     PLT     MPV        06/02/13         06/02/13         6.2     3.93    13.4    40.2    102.3   34.0    33.3    13.4    157     9.3        11:24:00         11:45:00                 (L)                     (H)        Previous Results        09/06/12         09/06/12         4.7     3.62    12.0    36.1    99.8    33.3    33.3    12.6    143     8.8        13:05:00         14:22:00                 (L)          Collection Time  Result Time      NEUTRO  BASOS   LYMPHO  MONO    EOS     BASOS   NEUTRO  LYMPHS  MONOS   EOS                                          PCT     ABS     PCT     PCT             PCT     ABS     ABS     ABS     ABS        06/02/13         06/02/13  11:24:00         11:45:00        Previous Results        09/06/12         09/06/12         68.0    0.0     23.7    7.1     0.8     0.4     3.2     1.1     0.3     0.0        13:05:00         14:22:00             Amylase (Final result)    Abnormal                                                  Component (Lab Inquiry)          Collection Time   Result Time       AMYLASE        06/02/13 11:24:00 06/02/13 12:02:00 162 (H)              Lipase (Final result)                                                               Component (Lab Inquiry)          Collection Time   Result Time       LIPASE        06/02/13 11:24:00 06/02/13 12:02:00 53.0             Troponin (Final result)                                                             Component (Lab Inquiry)          Collection Time   Result Time       Troponin        06/02/13 11:24:00 06/02/13 12:43:00 <0.01                                            Methodology by Troponin T          Imaging Results  ________________________________________________________________________________________________________________________       XR Chest Portable (Final result)                                                    Result time: 06/02/13 12:47:10     Final result by Rexene Edison, MD (06/02/13 12:47:10)     Impression:     IMPRESSION:     No acute cardiopulmonary disease.  Narrative:     Chest, portable upright.  Jun 02, 2013 12:21:54 PM:       INDICATION:  SOB, nausea vomiting.         COMPARISON:  11/12/2009       FINDINGS:  The cardiac silhouette is normal. Calcified granuloma     are noted in the right hilum and paratracheal region. There is     moderate ossification of the thoracic aorta. Lungs are clear.        ________________________________________________________________________________________________________________________  ED Medication Administration from 06/02/2013 1108 to 06/04/2013 1301    ________________________________________________________________________________________________________________________        Date/Time      Order                          Dose     Route    Action           Action by             Comments          06/02/2013     0.9 % sodium chloride bolus    500 mL   Intraven [address]          Stanford Breed, RN        1302                                                   ous        06/02/2013     0.9 % sodium chloride bolus    0 mL     Intraven Stopped           Stanford Breed, RN        1524                                                   ous        06/02/2013     ondansetron (ZOFRAN) injection 4 mg     Intraven Given            Stanford Breed, RN        1302           4 mg                                    ous    ________________________________________________________________________________________________________________________    ________________________________________________________________________________________________________________________  ED Treatment Team    ________________________________________________________________________________________________________________________     Provider                Role                    From              To                Phone             Pager     Adele Dan, MD    Attending Provider      06/02/13 1147     --  [phone]     Rick Duff, PA       Physician Assistant     06/02/13 1145     --                [phone]    ________________________________________________________________________________________________________________________    ________________________________________________________________________________________________________________________  Code Onset/Outcome  ________________________________________________________________________________________________________________________     None    ________________________________________________________________________________________________________________________  Code Interventions/Drips/Airways  ________________________________________________________________________________________________________________________     None    ________________________________________________________________________________________________________________________  [facility] Diagnoses  ________________________________________________________________________________________________________________________     Dizziness     Nausea &  vomiting    ________________________________________________________________________________________________________________________  [facility] Prescriptions    ________________________________________________________________________________________________________________________     Medication                             Sig               Dispense    Start Date   End Date     Auth. Provider     ondansetron (ZOFRAN ODT) 4 MG          Take 1-2 tablets  12 tablet   06/02/2013                 Rick Duff, PA     disintegrating tablet                  by mouth every 12                                            hours as needed                                            for Nausea. May                                            Sub regular                                            tablet (non-ODT)                                            if insurance does                                            not cover ODT.    ________________________________________________________________________________________________________________________    ________________________________________________________________________________________________________________________  [facility] Follow-up Information    ________________________________________________________________________________________________________________________     Follow up With                Details                       Comments  Contact Info     Gredmarie Delange Jackquline [place]             Schedule an appointment as                                   soon as possible for a visit                                   in 3 days    ________________________________________________________________________________________________________________________    [facility]  ________________________________________________________________________________________________________________________  Discharge  Instructions  ________________________________________________________________________________________________________________________      Gastroenteritis: After Your Visit  Your Care Instructions  Gastroenteritis is an illness that may cause nausea, vomiting, and diarrhea. It is sometimes called stomach flu. It  can be caused by bacteria or a virus.  You will probably begin to feel better in 1 to 2 days. In the meantime, get plenty of rest and make sure you do not  become dehydrated. Dehydration occurs when your body loses too much fluid.  Follow-up care is a key part of your treatment and safety. Be sure to make and go to all appointments, and call your  doctor if you are having problems. It?s also a good idea to know your test results and keep a list of the medicines you  take.  How can you care for yourself at home?  *  If your doctor prescribed antibiotics, take them as directed. Do not stop taking them just because you feel better. You  need to take the full course of antibiotics.  *  Drink plenty of fluids to prevent dehydration, enough so that your urine is light yellow or clear like water. Choose  water and other caffeine-free clear liquids until you feel better. If you have kidney, heart, or liver disease and have  to limit fluids, talk with your doctor before you increase your fluid intake.  *  Drink fluids slowly, in frequent, small amounts, because drinking too much too fast can cause vomiting.  *  Begin eating mild foods, such as dry toast, yogurt, applesauce, bananas, and rice. Avoid spicy, hot, or high-fat foods,  and do not drink alcohol or caffeine for a day or two. Do not drink milk or eat ice cream until you are feeling better.  How to prevent gastroenteritis  *  Keep hot foods hot and cold foods cold.  *  Do not eat meats, dressings, salads, or other foods that have been kept at room temperature for more than 2 hours.  *  Use a thermometer to check your refrigerator. It should be between  34??????F and 40??????F.  *  Defrost meats in the refrigerator or microwave, not on the kitchen counter.  *  Keep your hands and your kitchen clean. Wash your hands, cutting boards, and countertops with hot soapy water  frequently.  *  Cook meat until it is well done.  *  Do not eat raw eggs or uncooked sauces made with raw eggs.  *  Do not take chances. If food looks or tastes spoiled, throw it out.  When should you call for help?  Call 911 anytime you think you may need emergency care. For example, call if:  *  You vomit blood or what looks like coffee grounds.  *  You passed out (lost consciousness).  *  You pass maroon or  very bloody stools.  Call your doctor now or seek immediate medical care if:  *  You have severe belly pain.  *  You have signs of needing more fluids. You have sunken eyes, a dry mouth, and pass only a little dark urine.  *  You feel like you are going to faint.  *  You have increased belly pain that does not go away in 1 to 2 days.  *  You have new or increased nausea, or you are vomiting.  *  You have a new or higher fever.  *  Your stools are black and tarlike or have streaks of blood.  Watch closely for changes in your health, and be sure to contact your doctor if:  *  You are dizzy or lightheaded.  *  You urinate less than usual, or your urine is dark yellow or brown.  *  You do not feel better with each day that goes by.  ________________________________________________________________________________________________________________________                                             Where can you learn more?  Go to [email or web address] and sign in to your MyChart account. Enter N142 in the Search Health  Information box to learn more about ? Gastroenteritis: After Your Visit. ? If you do not have an account, please click  on the ? Sign Up Now ? link.  ?????? 2006-2015 [employer]. Care instructions adapted under license by [facility]. This care instruction  is  for use with your licensed healthcare professional. If you have questions about a medical condition or this instruction,  always ask your healthcare professional. [employer] disclaims any warranty or liability for your use of  this information.  Content Version: 10.4.390249; Current as of: February 06, 2013      Your nausea and vomiting possibly related  to food or virus.  Dizziness related to the whole event process.  If you should develop headache, return of dizziness or  spinning, chest pain or shortness of breath return to the emergency room.  Use the Zofran to help control nausea if  needed.  Advance diet as tolerated.  At time of discharge you had no more nausea.  We did give you Zofran 4 mg  intravenously along with 500 cc of fluid to rehydrate.      This document has removed original colors, tables and or formatting to be viewed in this system and is therefore  not the legal report and should not be relied on as the official report.  If there are any questions, the legal  report in the originating system should be viewed and used as the authoritative result.

## 2013-06-03 LAB — EKG 12-LEAD
Atrial Rate: 59 {beats}/min
P Axis: 66 degrees
P-R Interval: 166 ms
Q-T Interval: 420 ms
QRS Duration: 72 ms
QTc Calculation (Bazett): 415 ms
R Axis: 49 degrees
T Axis: 33 degrees
Ventricular Rate: 59 {beats}/min

## 2013-06-04 NOTE — Unmapped (Signed)
________________________________________________________________________________________________________________________       Your patient was seen at a [facility]. Please go to     [email or web address] to view information filed to your patient's chart in Epic.       If you need to view your patient's results prior to gaining access to Epic CareLink, please contact the [facility] where your patient was seen.            Stephanie Suarez, Stephanie Suarez #[id number] ([id number])  (80 y.o. F)   PCP: Tobie Poet DO        [facility] ED Arrival Information    ________________________________________________________________________________________________________________________     Expected         Arrival          Acuity           Means of Arrival Escorted By      Service          Admission Type     -                06/02/2013 11:08  3-Urgent         Personal         -                Emergency        Emergency                      AM                                Transport                         Medicine      ________________________________________________________________________________________________________________________     Arrival Complaint     NAUSEA WITH VOMITING    ________________________________________________________________________________________________________________________    ________________________________________________________________________________________________________________________  [facility] Chief Complaint  ________________________________________________________________________________________________________________________     Nausea                                          n/v, dizzy started today.    ________________________________________________________________________________________________________________________  ED  Vitals    ________________________________________________________________________________________________________________________     Date/Time           Temp            Pulse           Resp            BP              SpO2            Weight       Who       06/02/13 1422       --              69              --              140/63          --              --           CHW     06/02/13 1402       --  69              --              130/55          --              --           CHW     06/02/13 1342       --              69              --              138/59          97 %            --           CHW     06/02/13 1322       --              65              --              132/51          --              --           CHW     06/02/13 1302       --              65              --              140/58          --              --           CHW     06/02/13 1120       98.9 ??????F         66              18              177/70          97 %            130 lb       TS                         (37.2 ??????C)                                                                       (58.968 kg)    ________________________________________________________________________________________________________________________    ________________________________________________________________________________________________________________________  [facility] Allergies (verified on: 06/02/13)  ________________________________________________________________________________________________________________________     (No Known Allergies)    ________________________________________________________________________________________________________________________  [facility] Medical History    ________________________________________________________________________________________________________________________     Past Medical History                            Date  Comments     Hypertension [401.9]     Hyperlipidemia  [272.4]    ________________________________________________________________________________________________________________________    ________________________________________________________________________________________________________________________  Surgical History  ________________________________________________________________________________________________________________________     None    ________________________________________________________________________________________________________________________  Obstetric History  ________________________________________________________________________________________________________________________     The patient has not been asked about pregnancy.    ________________________________________________________________________________________________________________________  ED Provider Notes  ________________________________________________________________________________________________________________________       Adele Dan, MD   06/04/2013  1:00 PM          I have personally performed a face to face diagnostic evaluation on this patient. I have fully participated in the        care of this patient. I have reviewed and agree with all pertinent clinical information including history,        physical exam, diagnostic tests, and the plan.          History and Physical as follows: 80 year old female here for evaluation of dizziness, nausea, vomiting.  She        describes it as a vertigo or spinning sensation.  No pain complaints.  No headaches or chest pain.  No syncope.        No strokelike symptoms.  Dizziness was without any numbness, tingling, weakness.  No confusion or facial droop.        No slurred speech. On physical exam she is awake and alert, oriented ??????3.  No facial droop.  Pupils are equal and        reactive.  Strength is equal bilaterally.  Speech is clear.          EKG reviewed by myself: Sinus bradycardia.  Rate of 59.  Normal  axis.  No significant change when compared to        previous EKG from September 06, 2012.          Evaluation today is most consistent with benign dizziness, nausea, vomiting.  They're concerned that her symptoms        may be related to something she ate.  Nothing to suggest stroke or TIA.  Nothing to suggest an acute cardiac        event.          Adele Dan, MD 06/04/13 1300        ________________________________________________________________________________________________________________________    ED Medication Orders    ________________________________________________________________________________________________________________________     Start                                                               Status                 Ordering Provider           06/02/13                                                            Last MAR action:       BENTZ, MICHAEL     1210         0.9 % sodium chloride bolus ONCE  Stopped - by Stanford Breed on 06/02/13 at                                                                         1524    ________________________________________________________________________________________________________________________    ________________________________________________________________________________________________________________________  Lab Results  ________________________________________________________________________________________________________________________       Urinalysis (Final result)                                                           Component (Lab Inquiry)          Collection Time Result Time       COLOR UCLARITY GLUCOSE BILI UR KETONES SPEC    BLOOD U PH, UR  Protein Urobi        06/02/13        06/02/13 12:22:00 Yellow Clear   Negativ Negativ Negativ 1.020   Negativ 7.0     Negativ 0.2        12:14:00                                         e        e       e               e               e        Previous Results        08/23/08        08/23/08 07:28:00 YELLOW CLEAR   NEGATIV NEGATIV NEGATIV >=1.030 NEGATIV 5.5     NEGATIV 0.2        06:20:00                                         E       E       E               E               E            Collection Time      Result Time          NITRITELEUKOCYMicroscUrine  WBC UA RBC  UAEpi    BACTERIMUCUS  TES, URopic   Type                 Cells  A                                                                Examina                                                                tion        06/02/13 12:14:00    06/02/13 12:22:00    NegativNegativNot    Not                                                  e      e      IndicatSpecifi                                                                ed     ed        Previous Results          08/23/08 06:20:00    08/23/08 07:28:00    NEGATIVTRACE                6-10   Rare   3-5    Rare   1+                                                  E                           (H)      ________________________________________________________________________________________________________________________       Final result     Narrative:     no initials or time         Comprehensive Metabolic Panel (Final result)    Abnormal                            Component (Lab Inquiry)          Collection Time Result Time       NA     K       CL      CO2     ANION   GLUCOSE BUN     CREATIN GFR     GFR        06/02/13        06/02/13 12:02:00 143    4.2     103  27      13      94      15      0.9     60 (A)  >60        11:24:00                                                                                         >60     Chron        Previous Results        09/06/12        09/06/12 14:38:00 141    3.8     105     30              94      16      0.9     >60     >60        13:05:00                                                                                          >60     Chron            Collection Time      Result Time          CALCIUMPROTEINAlb    AlbuminBILI   ALK    ALT    AST    Globu                                                                       /GlobulTOTAL  PHOS                 lin                                                                       in                                                                       Ratio  06/02/13 11:24:00    06/02/13 12:02:00    9.6    7.4    4.0    1.2    0.3    60     10     15     3.4        Previous Results          09/06/12 13:05:00    09/06/12 14:38:00    9.4               CBC (Final result)    Abnormal                                                      Component (Lab Inquiry)          Collection Time  Result Time      WBC     RBC     HGB     HCT     MCV     MCH     MCHC    RDW     PLT     MPV        06/02/13         06/02/13         6.2     3.93    13.4    40.2    102.3   34.0    33.3    13.4    157     9.3        11:24:00         11:45:00                 (L)                     (H)        Previous Results        09/06/12         09/06/12         4.7     3.62    12.0    36.1    99.8    33.3    33.3    12.6    143     8.8        13:05:00         14:22:00                 (L)          Collection Time  Result Time      NEUTRO  BASOS   LYMPHO  MONO    EOS     BASOS   NEUTRO  LYMPHS  MONOS   EOS                                          PCT     ABS     PCT     PCT             PCT     ABS     ABS     ABS     ABS        06/02/13         06/02/13  11:24:00         11:45:00        Previous Results        09/06/12         09/06/12         68.0    0.0     23.7    7.1     0.8     0.4     3.2     1.1     0.3     0.0        13:05:00         14:22:00             Amylase (Final result)    Abnormal                                                  Component (Lab Inquiry)          Collection Time   Result Time       AMYLASE        06/02/13 11:24:00 06/02/13 12:02:00 162 (H)              Lipase (Final result)                                                               Component (Lab Inquiry)          Collection Time   Result Time       LIPASE        06/02/13 11:24:00 06/02/13 12:02:00 53.0             Troponin (Final result)                                                             Component (Lab Inquiry)          Collection Time   Result Time       Troponin        06/02/13 11:24:00 06/02/13 12:43:00 <0.01                                            Methodology by Troponin T          Imaging Results  ________________________________________________________________________________________________________________________       XR Chest Portable (Final result)                                                    Result time: 06/02/13 12:47:10     Final result by Rexene Edison, MD (06/02/13 12:47:10)     Impression:     IMPRESSION:     No acute cardiopulmonary disease.  Narrative:     Chest, portable upright.  Jun 02, 2013 12:21:54 PM:       INDICATION:  SOB, nausea vomiting.         COMPARISON:  11/12/2009       FINDINGS:  The cardiac silhouette is normal. Calcified granuloma     are noted in the right hilum and paratracheal region. There is     moderate ossification of the thoracic aorta. Lungs are clear.        ________________________________________________________________________________________________________________________  ED Medication Administration from 06/02/2013 1108 to 06/04/2013 1301    ________________________________________________________________________________________________________________________        Date/Time      Order                          Dose     Route    Action           Action by             Comments          06/02/2013     0.9 % sodium chloride bolus    500 mL   Intraven [address]          Stanford Breed, RN        1302                                                   ous        06/02/2013     0.9 % sodium chloride bolus    0 mL     Intraven Stopped           Stanford Breed, RN        1524                                                   ous        06/02/2013     ondansetron (ZOFRAN) injection 4 mg     Intraven Given            Stanford Breed, RN        1302           4 mg                                    ous    ________________________________________________________________________________________________________________________    ________________________________________________________________________________________________________________________  ED Treatment Team    ________________________________________________________________________________________________________________________     Provider                Role                    From              To                Phone             Pager     Adele Dan, MD    Attending Provider      06/02/13 1147     --  [phone]     Rick Duff, PA       Physician Assistant     06/02/13 1145     --                [phone]    ________________________________________________________________________________________________________________________    ________________________________________________________________________________________________________________________  Code Onset/Outcome  ________________________________________________________________________________________________________________________     None    ________________________________________________________________________________________________________________________  Code Interventions/Drips/Airways  ________________________________________________________________________________________________________________________     None    ________________________________________________________________________________________________________________________  [facility] Diagnosis  ________________________________________________________________________________________________________________________      None    ________________________________________________________________________________________________________________________  [facility] Prescriptions    ________________________________________________________________________________________________________________________     Medication                             Sig               Dispense    Start Date   End Date     Auth. Provider     ondansetron (ZOFRAN ODT) 4 MG          Take 1-2 tablets  12 tablet   06/02/2013                 Rick Duff, PA     disintegrating tablet                  by mouth every 12                                            hours as needed                                            for Nausea. May                                            Sub regular                                            tablet (non-ODT)                                            if insurance does                                            not cover ODT.    ________________________________________________________________________________________________________________________    ________________________________________________________________________________________________________________________  [facility] Follow-up Information    ________________________________________________________________________________________________________________________     Follow up With                Details                       Comments                      Contact Info  Bahar Shelden Jackquline [place]             Schedule an appointment as                                   soon as possible for a visit                                   in 3 days    ________________________________________________________________________________________________________________________    [facility]  ________________________________________________________________________________________________________________________  Discharge  Instructions  ________________________________________________________________________________________________________________________      Gastroenteritis: After Your Visit  Your Care Instructions  Gastroenteritis is an illness that may cause nausea, vomiting, and diarrhea. It is sometimes called stomach flu. It  can be caused by bacteria or a virus.  You will probably begin to feel better in 1 to 2 days. In the meantime, get plenty of rest and make sure you do not  become dehydrated. Dehydration occurs when your body loses too much fluid.  Follow-up care is a key part of your treatment and safety. Be sure to make and go to all appointments, and call your  doctor if you are having problems. It?s also a good idea to know your test results and keep a list of the medicines you  take.  How can you care for yourself at home?  *  If your doctor prescribed antibiotics, take them as directed. Do not stop taking them just because you feel better. You  need to take the full course of antibiotics.  *  Drink plenty of fluids to prevent dehydration, enough so that your urine is light yellow or clear like water. Choose  water and other caffeine-free clear liquids until you feel better. If you have kidney, heart, or liver disease and have  to limit fluids, talk with your doctor before you increase your fluid intake.  *  Drink fluids slowly, in frequent, small amounts, because drinking too much too fast can cause vomiting.  *  Begin eating mild foods, such as dry toast, yogurt, applesauce, bananas, and rice. Avoid spicy, hot, or high-fat foods,  and do not drink alcohol or caffeine for a day or two. Do not drink milk or eat ice cream until you are feeling better.  How to prevent gastroenteritis  *  Keep hot foods hot and cold foods cold.  *  Do not eat meats, dressings, salads, or other foods that have been kept at room temperature for more than 2 hours.  *  Use a thermometer to check your refrigerator. It should be between  34??????F and 40??????F.  *  Defrost meats in the refrigerator or microwave, not on the kitchen counter.  *  Keep your hands and your kitchen clean. Wash your hands, cutting boards, and countertops with hot soapy water  frequently.  *  Cook meat until it is well done.  *  Do not eat raw eggs or uncooked sauces made with raw eggs.  *  Do not take chances. If food looks or tastes spoiled, throw it out.  When should you call for help?  Call 911 anytime you think you may need emergency care. For example, call if:  *  You vomit blood or what looks like coffee grounds.  *  You passed out (lost consciousness).  *  You pass maroon or very bloody stools.  Call your  doctor now or seek immediate medical care if:  *  You have severe belly pain.  *  You have signs of needing more fluids. You have sunken eyes, a dry mouth, and pass only a little dark urine.  *  You feel like you are going to faint.  *  You have increased belly pain that does not go away in 1 to 2 days.  *  You have new or increased nausea, or you are vomiting.  *  You have a new or higher fever.  *  Your stools are black and tarlike or have streaks of blood.  Watch closely for changes in your health, and be sure to contact your doctor if:  *  You are dizzy or lightheaded.  *  You urinate less than usual, or your urine is dark yellow or brown.  *  You do not feel better with each day that goes by.  ________________________________________________________________________________________________________________________                                             Where can you learn more?  Go to [email or web address] and sign in to your MyChart account. Enter N142 in the Search Health  Information box to learn more about ? Gastroenteritis: After Your Visit. ? If you do not have an account, please click  on the ? Sign Up Now ? link.  ?????? 2006-2015 [employer]. Care instructions adapted under license by [facility]. This care instruction  is  for use with your licensed healthcare professional. If you have questions about a medical condition or this instruction,  always ask your healthcare professional. [employer] disclaims any warranty or liability for your use of  this information.  Content Version: 10.4.390249; Current as of: February 06, 2013      Your nausea and vomiting possibly related  to food or virus.  Dizziness related to the whole event process.  If you should develop headache, return of dizziness or  spinning, chest pain or shortness of breath return to the emergency room.  Use the Zofran to help control nausea if  needed.  Advance diet as tolerated.  At time of discharge you had no more nausea.  We did give you Zofran 4 mg  intravenously along with 500 cc of fluid to rehydrate.      This document has removed original colors, tables and or formatting to be viewed in this system and is therefore  not the legal report and should not be relied on as the official report.  If there are any questions, the legal  report in the originating system should be viewed and used as the authoritative result.

## 2013-06-18 NOTE — Progress Notes (Signed)
Subjective:      Patient ID: Amber Palmer is a 80 y.o. female.    HPI       This patient is here for the first time to see me,    Hypertension    Hyperlipidemia    Vitamin D deficiency    Depression screening    Screening       blood pressure is well controlled on current medications, there are no symptoms of headache blurred vision or dizziness, no reported adverse effects of antihypertensive medications,, blood pressure is aggravated with stress and anxiety and is relieved by rest and medications.    Lipids are well controlled on current medications,  is following a strict low fat diet, there are no reported adverse effects of antilipid medications like muscle cramps or jaundice.    She exhibits no symptoms of chronic severe or acute vitamin D deficiency like bone pains or recent bone fractures.    Review of Systems   Constitutional: Negative for fever, chills, diaphoresis, activity change, appetite change, fatigue and unexpected weight change.   HENT: Negative for congestion, ear discharge, ear pain, hearing loss, postnasal drip, rhinorrhea, sinus pressure, sneezing, sore throat and tinnitus.    Eyes: Negative for photophobia, pain, discharge, redness, itching and visual disturbance.   Respiratory: Negative for apnea, cough, choking, chest tightness, shortness of breath, wheezing and stridor.    Cardiovascular: Negative for chest pain, palpitations and leg swelling.   Gastrointestinal: Negative for nausea, vomiting, abdominal pain, diarrhea, constipation, blood in stool and abdominal distention.   Endocrine: Negative for cold intolerance, heat intolerance, polydipsia, polyphagia and polyuria.   Genitourinary: Negative for urgency, frequency, hematuria, decreased urine volume, difficulty urinating and menstrual problem.   Musculoskeletal: Negative for myalgias, back pain, arthralgias, gait problem, neck pain and neck stiffness.   Skin: Negative for color change, pallor, rash and wound.   Allergic/Immunologic:  Negative for environmental allergies, food allergies and immunocompromised state.   Neurological: Negative for dizziness, tremors, seizures, weakness, light-headedness and headaches.   Hematological: Negative for adenopathy. Does not bruise/bleed easily.   Psychiatric/Behavioral: Negative for sleep disturbance, dysphoric mood and decreased concentration. The patient is not nervous/anxious.    All other systems reviewed and are negative.      Objective:   Physical Exam   Constitutional: She is oriented to person, place, and time. She appears well-developed and well-nourished. No distress.   HENT:   Head: Normocephalic and atraumatic.   Right Ear: External ear normal.   Left Ear: External ear normal.   Nose: Nose normal.   Eyes: Conjunctivae and EOM are normal. Right eye exhibits no discharge. Left eye exhibits no discharge. No scleral icterus.   Neck: Normal range of motion. Neck supple. No tracheal deviation present. No thyromegaly present.   Cardiovascular: Normal rate, regular rhythm, normal heart sounds and intact distal pulses.  Exam reveals no gallop and no friction rub.    No murmur heard.  Pulmonary/Chest: Breath sounds normal. No stridor. No respiratory distress. She has no wheezes. She has no rales. She exhibits no tenderness.   Neurological: She is alert and oriented to person, place, and time.   Skin: Skin is warm and dry. No rash noted. She is not diaphoretic. No erythema. No pallor.   Psychiatric: She has a normal mood and affect. Her behavior is normal. Judgment and thought content normal.       Assessment:      Amber Palmer was seen today for established new doctor.    Diagnoses  and associated orders for this visit:    Hypertension  - CBC Auto Differential  - Comprehensive Metabolic Panel    Hyperlipidemia  - Comprehensive Metabolic Panel  - Lipid Panel  - TSH without Reflex    Vitamin D deficiency  - Vitamin D 25 Hydroxy    Depression screening  - Negative Screen for Clinical Depression, Follow-up not  Required [phone]    Screening  Comments: Fall Risk Assessment Completed  - HM FALL RISK    All problems were discussed with patient in detail and at length including any OTC or prescription medications , patient information material specific for individual conditions were provided , risks and benefits of treatment/procedures were explained,pt verbalized understanding.

## 2013-06-19 LAB — COMPREHENSIVE METABOLIC PANEL
ALT: 10 U/L (ref 10–40)
AST: 18 U/L (ref 15–37)
Albumin/Globulin Ratio: 1.4 (ref 1.1–2.2)
Albumin: 3.9 g/dL (ref 3.4–5.0)
Alkaline Phosphatase: 53 U/L (ref 40–129)
Anion Gap: 13 (ref 3–16)
BUN: 16 mg/dL (ref 7–20)
CO2: 25 mmol/L (ref 21–32)
Calcium: 9.2 mg/dL (ref 8.3–10.6)
Chloride: 105 mmol/L (ref 99–110)
Creatinine: 1 mg/dL (ref 0.6–1.2)
GFR African American: >60 (ref >60)
GFR Non-African American: 53 — AB (ref >60)
Globulin: 2.8 g/dL
Glucose: 91 mg/dL (ref 70–99)
Potassium: 4.4 mmol/L (ref 3.5–5.1)
Sodium: 143 mmol/L (ref 136–145)
Total Bilirubin: 0.3 mg/dL (ref 0.0–1.0)
Total Protein: 6.7 g/dL (ref 6.4–8.2)

## 2013-06-19 LAB — CBC WITH AUTO DIFFERENTIAL
Basophils %: 0.2 %
Basophils Absolute: 0 10*3/uL (ref 0.0–0.2)
Eosinophils %: 1.8 %
Eosinophils Absolute: 0.1 10*3/uL (ref 0.0–0.6)
Hematocrit: 38.8 % (ref 36.0–48.0)
Hemoglobin: 12.8 g/dL (ref 12.0–16.0)
Lymphocytes %: 26.9 %
Lymphocytes Absolute: 1.4 10*3/uL (ref 1.0–5.1)
MCH: 33.9 pg (ref 26.0–34.0)
MCHC: 32.9 g/dL (ref 31.0–36.0)
MCV: 102.9 fL — ABNORMAL HIGH (ref 80.0–100.0)
MPV: 13 fL — ABNORMAL HIGH (ref 5.0–10.5)
Monocytes %: 8.2 %
Monocytes Absolute: 0.4 10*3/uL (ref 0.0–1.3)
Neutrophils %: 62.9 %
Neutrophils Absolute: 3.2 10*3/uL (ref 1.7–7.7)
PLATELET SLIDE REVIEW: DECREASED
Platelets: 97 10*3/uL — ABNORMAL LOW (ref 135–450)
RBC: 3.77 M/uL — ABNORMAL LOW (ref 4.00–5.20)
RDW: 13.4 % (ref 12.4–15.4)
WBC: 5.1 10*3/uL (ref 4.0–11.0)

## 2013-06-19 LAB — LIPID PANEL
Cholesterol, Total: 235 mg/dL — ABNORMAL HIGH (ref 0–199)
HDL: 72 mg/dL — ABNORMAL HIGH (ref 40–60)
LDL Calculated: 151 mg/dL — ABNORMAL HIGH (ref <100)
Triglycerides: 58 mg/dL (ref 0–150)
VLDL Cholesterol Calculated: 12 mg/dL

## 2013-06-19 LAB — VITAMIN D 25 HYDROXY: Vit D, 25-Hydroxy: 18.5 ng/mL — ABNORMAL LOW (ref >=30)

## 2013-06-19 LAB — TSH: TSH: 1.9 u[IU]/mL (ref 0.27–4.20)

## 2013-07-06 MED ORDER — ATORVASTATIN CALCIUM 20 MG PO TABS
20 | ORAL_TABLET | Freq: Every day | ORAL | Status: DC
Start: 2013-07-06 — End: 2013-12-10

## 2013-07-06 MED ORDER — NEOMYCIN-POLYMYXIN-HC 3.5-10000-1 OT SUSP
3.5-10000-1 | Freq: Four times a day (QID) | OTIC | Status: AC
Start: 2013-07-06 — End: 2013-07-16

## 2013-07-06 MED ORDER — LISINOPRIL 20 MG PO TABS
20 | ORAL_TABLET | Freq: Every day | ORAL | Status: DC
Start: 2013-07-06 — End: 2013-12-10

## 2013-07-06 MED ORDER — CHOLECALCIFEROL 1.25 MG (50000 UT) PO CAPS
50000 | ORAL_CAPSULE | ORAL | Status: DC
Start: 2013-07-06 — End: 2013-12-10

## 2013-07-06 NOTE — Progress Notes (Signed)
Subjective:      Patient ID: Amber Palmer is a 80 y.o. female.    HPI       This patient is here for a follow up on following problems    Hyperlipidemia    Vitamin D deficiency    Hypertension        Lipids are not well controlled on current medications,  is following a strict low fat diet, there are no reported adverse effects of antilipid medications like muscle cramps or jaundice.    She exhibits no symptoms of chronic severe or acute vitamin D deficiency like bone pains or recent bone fractures.    blood pressure is not  well controlled on current medications, there are no symptoms of headache blurred vision or dizziness, no reported adverse effects of antihypertensive medications,, blood pressure is aggravated with stress and anxiety and is relieved by rest and medications.    Review of Systems   Constitutional: Negative for fever, chills, diaphoresis, activity change, appetite change, fatigue and unexpected weight change.   HENT: Negative for congestion, ear discharge, ear pain, hearing loss, postnasal drip, rhinorrhea, sinus pressure, sneezing, sore throat and tinnitus.    Eyes: Negative for photophobia, pain, discharge, redness, itching and visual disturbance.   Respiratory: Negative for apnea, cough, choking, chest tightness, shortness of breath, wheezing and stridor.    Cardiovascular: Negative for chest pain, palpitations and leg swelling.   Gastrointestinal: Negative for nausea, vomiting, abdominal pain, diarrhea, constipation, blood in stool and abdominal distention.   Endocrine: Negative for cold intolerance, heat intolerance, polydipsia, polyphagia and polyuria.   Genitourinary: Negative for urgency, frequency, hematuria, decreased urine volume, difficulty urinating and menstrual problem.   Musculoskeletal: Negative for myalgias, back pain, arthralgias, gait problem, neck pain and neck stiffness.   Skin: Negative for color change, pallor, rash and wound.   Allergic/Immunologic: Negative for  environmental allergies, food allergies and immunocompromised state.   Neurological: Negative for dizziness, tremors, seizures, weakness, light-headedness and headaches.   Hematological: Negative for adenopathy. Does not bruise/bleed easily.   Psychiatric/Behavioral: Negative for sleep disturbance, dysphoric mood and decreased concentration. The patient is not nervous/anxious.    All other systems reviewed and are negative.    Current Outpatient Prescriptions   Medication Sig Dispense Refill   . lisinopril (PRINIVIL;ZESTRIL) 20 MG tablet Take 1 tablet by mouth daily.  30 tablet  3   . atorvastatin (LIPITOR) 20 MG tablet Take 1 tablet by mouth daily.  30 tablet  3   . vitamin D (CHOLECALCIFEROL) 50000 UNIT CAPS Take 1 capsule by mouth once a week.  4 capsule  2     No current facility-administered medications for this visit.       Objective:   Physical Exam   Constitutional: She is oriented to person, place, and time. She appears well-developed and well-nourished. No distress.   HENT:   Head: Normocephalic and atraumatic.   Right Ear: External ear normal.   Left Ear: External ear normal.   Nose: Nose normal.   Eyes: Conjunctivae and EOM are normal. Right eye exhibits no discharge. Left eye exhibits no discharge. No scleral icterus.   Neck: Normal range of motion. Neck supple. No tracheal deviation present. No thyromegaly present.   Cardiovascular: Normal rate, regular rhythm, normal heart sounds and intact distal pulses.  Exam reveals no gallop and no friction rub.    No murmur heard.  Pulmonary/Chest: Breath sounds normal. No stridor. No respiratory distress. She has no wheezes. She has no rales.  She exhibits no tenderness.   Neurological: She is alert and oriented to person, place, and time.   Skin: Skin is warm and dry. No rash noted. She is not diaphoretic. No erythema. No pallor.   Psychiatric: She has a normal mood and affect. Her behavior is normal. Judgment and thought content normal.       Assessment:       Cynithia was seen today for abnormal test results.    Diagnoses and associated orders for this visit:    Hyperlipidemia  - atorvastatin (LIPITOR) 20 MG tablet; Take 1 tablet by mouth daily.    Vitamin D deficiency  - vitamin D (CHOLECALCIFEROL) 50000 UNIT CAPS; Take 1 capsule by mouth once a week.    Hypertension  - lisinopril (PRINIVIL;ZESTRIL) 20 MG tablet; Take 1 tablet by mouth daily.    Cerumen impaction  - PR REMOVAL IMPACTED CERUMEN INSTRUMENTATION UNILAT  -     Removed cerumen from both ears with curette and flushed residual  Without complications      All problems were discussed with patient in detail and at length including any OTC or prescription medications , patient information material specific for individual conditions were provided , risks and benefits of treatment/procedures were explained,pt verbalized understanding.

## 2013-07-06 NOTE — Addendum Note (Signed)
Addended by: Erin Sons on: 07/06/2013 03:57 PM     Modules accepted: Orders

## 2013-12-10 ENCOUNTER — Encounter

## 2013-12-10 MED ORDER — LISINOPRIL 20 MG PO TABS
20 | ORAL_TABLET | Freq: Every day | ORAL | Status: DC
Start: 2013-12-10 — End: 2021-08-24

## 2013-12-10 MED ORDER — ATORVASTATIN CALCIUM 20 MG PO TABS
20 | ORAL_TABLET | Freq: Every day | ORAL | Status: DC
Start: 2013-12-10 — End: 2021-09-20

## 2013-12-10 MED ORDER — CHOLECALCIFEROL 1.25 MG (50000 UT) PO CAPS
50000 | ORAL_CAPSULE | ORAL | Status: DC
Start: 2013-12-10 — End: 2021-09-20

## 2015-10-19 ENCOUNTER — Inpatient Hospital Stay
Admit: 2015-10-19 | Discharge: 2015-10-19 | Disposition: A | Discharge status: Home / Self Care | Attending: Emergency Medicine

## 2015-10-19 DIAGNOSIS — T63444A Toxic effect of venom of bees, undetermined, initial encounter: Secondary | ICD-10-CM

## 2015-10-19 MED ORDER — CEPHALEXIN 500 MG PO CAPS
500 | ORAL_CAPSULE | Freq: Three times a day (TID) | ORAL | 0 refills | Status: AC
Start: 2015-10-19 — End: 2015-10-26

## 2015-10-19 MED ORDER — DIPHENHYDRAMINE HCL 25 MG PO CAPS
25 | ORAL_CAPSULE | ORAL | 0 refills | Status: AC | PRN
Start: 2015-10-19 — End: 2015-10-29

## 2015-10-19 MED ORDER — DIPHENHYDRAMINE HCL 25 MG PO TABS
25 MG | Freq: Once | ORAL | Status: AC
Start: 2015-10-19 — End: 2015-10-19
  Administered 2015-10-19: 10:00:00 25 mg via ORAL

## 2015-10-19 MED FILL — DIPHENHYDRAMINE HCL 25 MG PO TABS: 25 mg | ORAL | Qty: 1

## 2015-10-19 NOTE — ED Provider Notes (Signed)
[facility]  eMERGENCY dEPARTMENT eNCOUnter      Pt Name: Amber Palmer  MRN: [id number]  [place] 08/19/1929  Date of evaluation: 10/19/2015  Provider: Glenard Haring, MD    CHIEF COMPLAINT       Chief Complaint   Patient presents with   ??? Insect Bite     pt stung by bee approx noon on monday, allergic to bee's swelling to R hand and thumb         HISTORY OF PRESENT ILLNESS   (Location/Symptom, Timing/Onset, Context/Setting, Quality, Duration, Modifying Factors, Severity)  Note limiting factors.   Amber Palmer is a 82 y.o. female who presents to the emergency department pmh as noted, here with bee sting and now redness and swelling of the right hand. Pt states she was stung by a bee on right hand thenar compartment (skin over right thumb metacarpal bone) yesterday at noon, and since then it is red and slightly swollen, a little uncomfortable. She notes it feels identical to prior reactions she's had to bee stings, which have caused similar reactions and in the past required benadryl and "an antibiotic." no sob/throat or tongue swelling or airway closure. No n/v/d/fever. No palpitations/dizziness. Denies rash anywhere else. Denies other sx's.      HPI    Nursing Notes were reviewed.    REVIEW OF SYSTEMS    (2-9 systems for level 4, 10 or more for level 5)     Review of Systems    Except as noted above the remainder of the review of systems was reviewed and negative.       PAST MEDICAL HISTORY     Past Medical History:   Diagnosis Date   ??? Hyperlipidemia    ??? Hypertension    ??? Vitamin D deficiency          SURGICAL HISTORY     History reviewed. No pertinent surgical history.      CURRENT MEDICATIONS       Previous Medications    ATORVASTATIN (LIPITOR) 20 MG TABLET    Take 1 tablet by mouth daily    CALCIUM ASCORBATE 500 MG TABS    Take 500 mg by mouth    CALCIUM CARBONATE (OSCAL) 500 MG TABS TABLET    Take 500 mg by mouth daily    FERROUS SULFATE 325 (65 FE) MG TABLET    Take 325 mg by mouth daily (with  breakfast)    LISINOPRIL (PRINIVIL;ZESTRIL) 20 MG TABLET    Take 1 tablet by mouth daily    VITAMIN B-1 (THIAMINE) 100 MG TABLET    Take 100 mg by mouth daily    VITAMIN D (CHOLECALCIFEROL) 50000 UNIT CAPS    Take 1 capsule by mouth once a week    VITAMIN E 1000 UNITS CAPSULE    Take 1,000 Units by mouth daily       ALLERGIES     Bee venom    FAMILY HISTORY     History reviewed. No pertinent family history.       SOCIAL HISTORY       Social History     Social History   ??? Marital status: Divorced     Spouse name: N/A   ??? Number of children: N/A   ??? Years of education: N/A     Social History Main Topics   ??? Smoking status: Never Smoker   ??? Smokeless tobacco: None   ??? Alcohol use No   ??? Drug use:  None   ??? Sexual activity: Not Asked     Other Topics Concern   ??? None     Social History Narrative       SCREENINGS             PHYSICAL EXAM    (up to 7 for level 4, 8 or more for level 5)   ED Triage Vitals   BP Temp Temp Source Pulse Resp SpO2 Height Weight   10/19/15 0518 10/19/15 0518 10/19/15 0518 10/19/15 0518 -- 10/19/15 0518 10/19/15 0518 10/19/15 0518   118/57 97.9 ??F (36.6 ??C) Oral 74  100 % 5\' 2"  (1.575 m) 125 lb (56.7 kg)       Physical Exam    General Appearance:  Alert, cooperative, no distress, appears stated age. Very well appearing, nontoxic.    Head:  Normocephalic, without obvious abnormality, atraumatic.   Eyes:  conjunctiva/corneas clear, EOM's intact.  Sclera anicteric.   ENT: Mucous membranes moist. Oropharynx normal and patent.    Neck: SUPPLE, symmetrical, trachea midline, no adenopathy.  No JVD. No mass.    Lungs:   CTA b/l, no w/c/r. No Respiratory distress. No stridor    Chest Wall:     Heart:  RRR no m/r/g. No edema.     Abdomen: Soft nontender. BS+. No rebound/guarding/organomegaly/mass.    Rectal:     GU:                 Back:                       Extremities: Full range of motion.  No deformity bilateral upper extremities. Erythema, warmth of skin of right thenar compartment of hand (does not  include fingers). No crepitus.   Nontender. Mild swelling of right hand thenar compartment.   No KNAVEL signs.  Radial pulses 2+ bilateral UE's. Brisk cap refill x10 fingers.   SILT radial/median/ulnar nerves bilateral UE's and x10 fingertips.   Motor 5/5 AIN/PIN/Intrinsics/biceps/triceps bilateral UE's.   Normal ROM all joints bilateral UE's.   No radial head or snuffbox tenderness.      Pulses: 2+ b/l UE's   Skin:  No rashes or lesions to exposed skin. Erythema, warmth of skin of right thenar compartment of hand (does not include fingers). No crepitus.    Neurologic: Alert and oriented X 3.     Motor and sensation grossly normal.    Speech clear.   Pupils PERRLA          DIAGNOSTIC RESULTS     EKG: All EKG's are interpreted by the Emergency Department Physician who either signs or Co-signs this chart in the absence of a cardiologist.        RADIOLOGY:   Non-plain film images such as CT, Ultrasound and MRI are read by the radiologist. Plain radiographic images are visualized and preliminarily interpreted by the emergency physician with the below findings:        Interpretation per the Radiologist below, if available at the time of this note:    No orders to display         ED BEDSIDE ULTRASOUND:   Performed by ED Physician - none    LABS:  Labs Reviewed - No data to display    All other labs were within normal range or not returned as of this dictation.    EMERGENCY DEPARTMENT COURSE and DIFFERENTIAL DIAGNOSIS/MDM:   Vitals:    Vitals:    10/19/15 0518  BP: (!) 118/57   Pulse: 74   Temp: 97.9 ??F (36.6 ??C)   TempSrc: Oral   SpO2: 100%   Weight: 125 lb (56.7 kg)   Height: 5\' 2"  (1.575 m)       MDM    The patient is otherwise not clinically toxic nor febrile. Differential diagnosis includes but is not limited to erysipelas, osteomyelitis, septic arthritis, lymphadenitis, DVT and necrotizing fasciitis. The appearance of the infection is such that Meeghan Skipper believe she will improve with oral antibiotics.  She was advised to  follow-up with their family doctor within the next 24-48 hours and return to the ED if there is no improvement or if the infection starts to worsen in any way.     Differential diagnosis includes DVT, cellulitis, necrotizing fasciitis, gangrene, necrosis, compartment syndrome, rhabdomyolysis, contusion, fracture, arterial and/or venous and/or lymphatic insufficiency, CHF, anemia, renal failure, lymphedema, baker's cyst, among other diagnoses.       CRITICAL CARE TIME   Total Critical Care time was 0 minutes, excluding separately reportable procedures.  There was a high probability of clinically significant/life threatening deterioration in the patient's condition which required my urgent intervention.      CONSULTS:  None      PROCEDURES:  Unless otherwise noted below, none     Procedures    FINAL IMPRESSION      1. Bee sting reaction, undetermined intent, initial encounter    2. Cellulitis of right hand excluding fingers and thumb          DISPOSITION/PLAN   DISPOSITION Decision to Discharge    PATIENT REFERRED TO:  Lillia Carmel  [place] [address]  [phone]    Schedule an appointment as soon as possible for a visit      [facility]  [address]  [place] [address]  [phone]    As needed, If symptoms worsen      DISCHARGE MEDICATIONS:  New Prescriptions    CEPHALEXIN (KEFLEX) 500 MG CAPSULE    Take 1 capsule by mouth 3 times daily for 7 days    DIPHENHYDRAMINE (BENADRYL) 25 MG CAPSULE    Take 1 capsule by mouth every 4 hours as needed for Itching          (Please note that portions of this note were completed with a voice recognition program.  Efforts were made to edit the dictations but occasionally words are mis-transcribed.)    Cindia Hustead Vida Roller, MD (electronically signed)  Attending Emergency Physician         [place], MD  10/19/15 [phone]

## 2015-10-19 NOTE — Unmapped (Signed)
Your patient was seen at a [facility]. Please go to [email or web address] to view information filed to your patient's chart in Epic.  If you need to view your patient's results prior to gaining access to   Epic CareLink, please contact the [facility] where your patient was seen.              [name], Shear #[id number] ([id number])  (82 y.o. F)   PCP: Tobie Poet [phone])    23-01         ED Arrival Information       Expected Arrival Acuity Means of Arrival Escorted By Service Admission Type    - 10/19/2015  5:07 AM 4-Less Urgent Personal Transport - Emergency Medicine -        Arrival Complaint    BEE [facility]        Chief Complaint     Insect Bite pt stung by bee approx noon on monday, allergic to bee's swelling to R hand and thumb      ED Vitals       Date/Time   Temp   Pulse   Resp   BP   SpO2   Weight Who    10/19/15 0518  97.9 ????F (36.6 ????C)  74  --  (!)  118/57  100 %  125 lb (56.7 kg) SNB        Allergies (verified on: 10/19/15)       Agent Severity Comments    BEE VENOM          Medical History       Past Medical History Date Comments    Hypertension [I10]      Hyperlipidemia [E78.5]      Vitamin D deficiency [E55.9]          Surgical History     None       Obstetric History     The patient has not been asked about pregnancy.      ED Provider Notes     I Reeves Forth, MD 10/19/2015  5:44 AM                [facility] ED  eMERGENCY dEPARTMENT eNCOUnter      Pt Name: Stephanie Suarez  MRN: [id number]  Birthdate [birth year]  Date of evaluation: 10/19/2015  Provider: Charline Bills Reeves Forth, MD    CHIEF COMPLAINT       Chief Complaint     Patient presents with     ??????? Insect Bite      pt stung by bee approx noon on monday, allergic to bee's swelling to R hand and thumb         HISTORY OF PRESENT ILLNESS  (Location/Symptom, Timing/Onset, Context/Setting, Quality, Duration, Modifying Factors, Severity) Note limiting factors.   Stephanie Suarez is a 82 y.o. female who  presents to the emergency department pmh as noted, here with bee sting and now redness and swelling of the right hand. Pt states she was stung by a bee on right hand thenar compartment (skin over right thumb metacarpal   bone) yesterday at noon, and since then it is red and slightly swollen, a little uncomfortable. She notes it feels identical to prior reactions she's had to bee stings, which have caused similar reactions and in the past required benadryl and an   antibiotic. no sob/throat or tongue swelling or airway closure. No n/v/d/fever. No palpitations/dizziness. Denies rash anywhere else. Denies other sx's.  HPI    Nursing Notes were reviewed.    REVIEW OF SYSTEMS   (2-9 systems for level 4, 10 or more for level 5)     Review of Systems    Except as noted above the remainder of the review of systems was reviewed and negative.       PAST MEDICAL HISTORY     Past Medical History:     Diagnosis  Date   ??????? Hyperlipidemia    ??????? Hypertension    ??????? Vitamin D deficiency          SURGICAL HISTORY     History reviewed. No pertinent surgical history.      CURRENT MEDICATIONS       Previous Medications      ATORVASTATIN (LIPITOR) 20 MG TABLET    Take 1 tablet by mouth daily    CALCIUM ASCORBATE 500 MG TABS    Take 500 mg by mouth    CALCIUM CARBONATE (OSCAL) 500 MG TABS TABLET    Take 500 mg by mouth daily    FERROUS SULFATE 325 (65 FE) MG TABLET    Take 325 mg by mouth daily (with breakfast)    LISINOPRIL (PRINIVIL;ZESTRIL) 20 MG TABLET    Take 1 tablet by mouth daily    VITAMIN B-1 (THIAMINE) 100 MG TABLET    Take 100 mg by mouth daily    VITAMIN D (CHOLECALCIFEROL) 50000 UNIT CAPS    Take 1 capsule by mouth once a week    VITAMIN E 1000 UNITS CAPSULE    Take 1,000 Units by mouth daily       ALLERGIES     Bee venom    FAMILY HISTORY     History reviewed. No pertinent family history.       SOCIAL HISTORY       Social History     Social History      ??????? Marital status:  Divorced     Spouse name: N/A   ??????? Number  of children:  N/A   ??????? Years of education:  N/A     Social History Main Topics     ??????? Smoking status: Never Smoker   ??????? Smokeless tobacco: None   ??????? Alcohol use No   ??????? Drug use: None   ??????? Sexual activity: Not Asked     Other Topics  Concern   ??????? None      Social History Narrative       SCREENINGS             PHYSICAL EXAM   (up to 7 for level 4, 8 or more for level 5)   ED Triage Vitals          BP Temp Temp Source Pulse Resp SpO2 Height Weight   10/19/15 0518 10/19/15 0518 10/19/15 0518 10/19/15 0518 -- 10/19/15 0518 10/19/15 0518 10/19/15 0518   118/57 97.9 ????F (36.6 ????C) Oral 74  100 % 5' 2 (1.575 m) 125 lb (56.7 kg)       Physical Exam    General Appearance:  Alert, cooperative, no distress, appears stated age. Very well appearing, nontoxic.    Head:  Normocephalic, without obvious abnormality, atraumatic.   Eyes:  conjunctiva/corneas clear, EOM's intact.  Sclera anicteric.   ENT: Mucous membranes moist. Oropharynx normal and patent.    Neck: SUPPLE, symmetrical, trachea midline, no adenopathy.  No JVD. No mass.    Lungs:   CTA b/l, no w/c/r. No Respiratory distress. No  stridor    Chest Wall:     Heart:  RRR no m/r/g. No edema.     Abdomen: Soft nontender. BS+. No rebound/guarding/organomegaly/mass.    Rectal:     GU:           Back:               Extremities: Full range of motion. No deformity bilateral upper extremities. Erythema, warmth of skin of right thenar compartment of hand (does not include fingers). No crepitus.  Nontender. Mild swelling of right hand thenar compartment.  No KNAVEL   signs. Radial pulses 2+ bilateral UE's. Brisk cap refill x10 fingers.  SILT radial/median/ulnar nerves bilateral UE's and x10 fingertips.  Motor 5/5 AIN/PIN/Intrinsics/biceps/triceps bilateral UE's.  Normal ROM all joints bilateral UE's.  No radial head   or snuffbox tenderness.     Pulses: 2+ b/l UE's   Skin:  No rashes or lesions to exposed skin. Erythema, warmth of skin of right thenar compartment of hand  (does not include fingers). No crepitus.    Neurologic: Alert and oriented X 3.    Motor and sensation grossly normal.   Speech clear.  Pupils PERRLA          DIAGNOSTIC RESULTS     EKG: All EKG's are interpreted by the Emergency Department Physician who either signs or Co-signs this chart in the absence of a cardiologist.        RADIOLOGY:   Non-plain film images such as CT, Ultrasound and MRI are read by the radiologist. Plain radiographic images are visualized and preliminarily interpreted by the emergency physician with the below findings:        Interpretation per the Radiologist below, if available at the time of this note:    No orders to display         ED BEDSIDE ULTRASOUND:   Performed by ED Physician - none    LABS:  Labs Reviewed - No data to display    All other labs were within normal range or not returned as of this dictation.    EMERGENCY DEPARTMENT COURSE and DIFFERENTIAL DIAGNOSIS/MDM:   Vitals:    Vitals:     10/19/15 0518   BP: (!) 118/57   Pulse: 74   Temp: 97.9 ????F (36.6 ????C)   TempSrc: Oral   SpO2: 100%   Weight: 125 lb (56.7 kg)   Height: 5' 2 (1.575 m)       MDM    The patient is otherwise not clinically toxic nor febrile. Differential diagnosis includes but is not limited to erysipelas, osteomyelitis, septic arthritis, lymphadenitis, DVT and necrotizing fasciitis. The appearance of the infection is such that I   believe she will improve with oral antibiotics.  She was advised to follow-up with their family doctor within the next 24-48 hours and return to the ED if there is no improvement or if the infection starts to worsen in any way.     Differential diagnosis includes DVT, cellulitis, necrotizing fasciitis, gangrene, necrosis, compartment syndrome, rhabdomyolysis, contusion, fracture, arterial and/or venous and/or lymphatic insufficiency, CHF, anemia, renal failure, lymphedema, baker's   cyst, among other diagnoses.       CRITICAL CARE TIME   Total Critical Care time was 0 minutes,  excluding separately reportable procedures.  There was a high probability of clinically significant/life threatening deterioration in the patient's condition which required my urgent intervention.      CONSULTS:  None      PROCEDURES:  Unless  otherwise noted below, none     Procedures    FINAL IMPRESSION      1. Bee sting reaction, undetermined intent, initial encounter    2. Cellulitis of right hand excluding fingers and thumb          DISPOSITION/PLAN   DISPOSITION Decision to Discharge    PATIENT REFERRED TO:  Ranelle Oyster  [address]  [place] [address]  [phone]    Schedule an appointment as soon as possible for a visit      [facility]  [address]  [place] [address]  [phone]    As needed, If symptoms worsen      DISCHARGE MEDICATIONS:  New Prescriptions      CEPHALEXIN (KEFLEX) 500 MG CAPSULE    Take 1 capsule by mouth 3 times daily for 7 days    DIPHENHYDRAMINE (BENADRYL) 25 MG CAPSULE    Take 1 capsule by mouth every 4 hours as needed for Itching          (Please note that portions of this note were completed with a voice recognition program.  Efforts were made to edit the dictations but occasionally words are mis-transcribed.)    I Reeves Forth, MD (electronically signed)  Attending Emergency Physician         I Reeves Forth, MD  10/19/15 [phone]          ED Medication Orders       Start Ordered     Status Ordering Provider    10/19/15 0540 10/19/15 0539  diphenhydrAMINE (BENADRYL) tablet 25 mg  ONCE     Acknowledged PULLMAN, I ORION        Lab Results     None      Imaging Results     None      ED Administered Medications from 10/19/2015 0507 to 10/19/2015 0544     None      ED Treatment Team       Provider Role From To Phone Pager    I Reeves Forth, MD Attending Provider 10/19/15 0532 -- [phone]         Code Onset/Outcome     None      Code Interventions/Drips/Airways     None      Diagnoses     Bee sting reaction, undetermined intent, initial  encounter     Cellulitis of right hand excluding fingers and thumb       ED Prescriptions       Medication Sig Dispense Start Date End Date Auth. Provider    diphenhydrAMINE (BENADRYL) 25 MG capsule Take 1 capsule by mouth every 4 hours as needed for Itching 1 capsule 10/19/2015 10/29/2015 I Reeves Forth, MD    cephALEXin [employer]) 500 MG capsule Take 1 capsule by mouth 3 times daily for 7 days 21 capsule 10/19/2015 10/26/2015 I Reeves Forth, MD        Follow-up Information       Follow up With Details Comments Contact Info    Lyri[facility] [place] Schedule an appointment as soon as possible for a visit  [address] [place] [address] [phone]     [facility]  As needed, If symptoms worsen [address] [place] [address] [phone]          Discharge Instructions       Take the medication as prescribed. See your doctor to follow up. Return to the ER any time  for new or worsening symptoms.            Cellulitis: Care Instructions  Your Care Instructions    Cellulitis is a skin infection. It often occurs after a break in the skin from a scrape, cut, bite, or puncture, or after a rash.  The doctor has checked you carefully, but problems can develop later. If you notice any problems or new symptoms, get medical treatment right away.  Follow-up care is a key part of your treatment and safety. Be sure to make and go to all appointments, and call your doctor if you are having problems. It's also a good idea to know your test results and keep a list of the medicines you take.  How can you care for yourself at home?  ???? Take your antibiotics as directed. Do not stop taking them just because you feel better. You need to take the full course of antibiotics.  ???? Prop up the infected area on pillows to reduce pain and swelling. Try to keep the area above the level of your heart as often as you can.  ???? If your doctor told you how to care for your wound, follow your doctor's instructions. If you did  not get instructions, follow this general advice:  ???? Wash the wound with clean water 2 times a day. Don't use hydrogen peroxide or alcohol, which can slow healing.  ???? You may cover the wound with a thin layer of petroleum jelly, such as Vaseline, and a nonstick bandage.  ???? Apply more petroleum jelly and replace the bandage as needed.  ???? Be safe with medicines. Take pain medicines exactly as directed.  ???? If the doctor gave you a prescription medicine for pain, take it as prescribed.  ???? If you are not taking a prescription pain medicine, ask your doctor if you can take an over-the-counter medicine.  To prevent cellulitis in the future  ???? Try to prevent cuts, scrapes, or other injuries to your skin. Cellulitis most often occurs where there is a break in the skin.  ???? If you get a scrape, cut, mild burn, or bite, wash the wound with clean water as soon as you can to help avoid infection. Don't use hydrogen peroxide or alcohol, which can slow healing.  ???? If you have swelling in your legs (edema), support stockings and good skin care may help prevent leg sores and cellulitis.  ???? Take care of your feet, especially if you have diabetes or other conditions that increase the risk of infection. Wear shoes and socks. Do not go barefoot. If you have athlete's foot or other skin problems on your feet, talk to your doctor about how to   treat them.  When should you call for help?  Call your doctor now or seek immediate medical care if:  ???? You have signs that your infection is getting worse, such as:  ???? Increased pain, swelling, warmth, or redness.  ???? Red streaks leading from the area.  ???? Pus draining from the area.  ???? A fever.  ???? You get a rash.  Watch closely for changes in your health, and be sure to contact your doctor if:  ???? You are not getting better after 1 day (24 hours).  ???? You do not get better as expected.  Where can you learn more?  Go to [email or web address] and sign in to your  MyChart account. Enter [phone] in the Search Health Information box to learn more about Cellulitis: Care Instructions.  If you do not have an account, please click on the Sign Up Now link.  Current as of: January 06, 2015  Content Version: 11.2  ???? 2006-2017 [employer]. Care instructions adapted under license by [facility]. If you have questions about a medical condition or this instruction, always ask your healthcare professional. [employer] disclaims any warranty   or liability for your use of this information.

## 2016-06-20 ENCOUNTER — Inpatient Hospital Stay
Admit: 2016-06-20 | Discharge: 2016-06-20 | Disposition: A | Discharge status: Home / Self Care | Attending: Family Medicine

## 2016-06-20 ENCOUNTER — Encounter: Admit: 2016-06-20 | Primary: Nurse Practitioner

## 2016-06-20 DIAGNOSIS — M25562 Pain in left knee: Secondary | ICD-10-CM

## 2016-06-20 LAB — SPECIMEN REJECTION

## 2016-06-20 LAB — CBC
Hematocrit: 37.8 % (ref 36.0–48.0)
Hemoglobin: 12.4 g/dL (ref 12.0–16.0)
MCH: 33.3 pg (ref 26.0–34.0)
MCHC: 32.9 g/dL (ref 31.0–36.0)
MCV: 101.4 fL — ABNORMAL HIGH (ref 80.0–100.0)
MPV: 9.2 fL (ref 5.0–10.5)
Platelets: 131 10*3/uL — ABNORMAL LOW (ref 135–450)
RBC: 3.72 M/uL — ABNORMAL LOW (ref 4.00–5.20)
RDW: 13.3 % (ref 12.4–15.4)
WBC: 4.4 10*3/uL (ref 4.0–11.0)

## 2016-06-20 LAB — D-DIMER, QUANTITATIVE: D-Dimer, Quant: 221 ng/mL DDU (ref 0–229)

## 2016-06-20 LAB — PROTIME-INR
INR: 0.99 (ref 0.85–1.15)
Protime: 11.2 s (ref 9.6–13.0)

## 2016-06-20 LAB — APTT: aPTT: 23.7 s — ABNORMAL LOW (ref 24.1–34.9)

## 2016-06-20 MED ORDER — LIDOCAINE 5 % EX PTCH
5 % | Freq: Once | CUTANEOUS | Status: DC
Start: 2016-06-20 — End: 2016-06-20
  Administered 2016-06-20: 09:00:00 1 via TRANSDERMAL

## 2016-06-20 MED ORDER — HYDROCODONE-ACETAMINOPHEN 5-325 MG PO TABS
5-325 MG | Freq: Once | ORAL | Status: AC
Start: 2016-06-20 — End: 2016-06-20
  Administered 2016-06-20: 09:00:00 1 via ORAL

## 2016-06-20 MED ORDER — ACETAMINOPHEN 500 MG PO TABS
500 | ORAL_TABLET | ORAL | 0 refills | Status: DC
Start: 2016-06-20 — End: 2017-07-11

## 2016-06-20 MED FILL — LIDODERM 5 % EX PTCH: 5 % | CUTANEOUS | Qty: 1

## 2016-06-20 MED FILL — HYDROCODONE-ACETAMINOPHEN 5-325 MG PO TABS: 5-325 mg | ORAL | Qty: 1

## 2016-06-20 NOTE — ED Provider Notes (Signed)
[facility]  [address]  [place] [address]  Dept: [place]: [phone]    eMERGENCY dEPARTMENT eNCOUnter        [place]    Chief Complaint   Patient presents with   ??? Knee Pain     L knee pain since Tuesday, denies injury       HPI    Amber Palmer is a 83 y.o. female who presents with Left knee pain over the last several days.  She does not remember a fall or trauma.  She denies any swelling either one of her knees.  She has known arthritis in both.  She has no back pain.  She has no radicular symptoms.  No bowel or bladder incontinence.  No lower extremity weakness.  She has not tried to treat this with any pain medications.  She denies history of DVT or PE.  She denies any swelling.  She denies asymmetric lower extremity edema.    REVIEW OF SYSTEMS    General: No fever or chills  Skin: No Rashes or redness of the skin  GU: No dysuria or hematuria  Musculoskeletal: See HPI    PAST MEDICAL & SURGICAL HISTORY    Past Medical History:   Diagnosis Date   ??? Hyperlipidemia    ??? Hypertension    ??? Vitamin D deficiency      History reviewed. No pertinent surgical history.    CURRENT MEDICATIONS  (may include discharge medications prescribed in the ED)  Current Outpatient Rx   Medication Sig Dispense Refill   ??? acetaminophen (APAP EXTRA STRENGTH) 500 MG tablet 1 tablet by mouth with breakfast, lunch, dinner, and before bedtime every day for the next 5 days 20 tablet 0   ??? Calcium Ascorbate 500 MG TABS Take 500 mg by mouth     ??? vitamin B-1 (THIAMINE) 100 MG tablet Take 100 mg by mouth daily     ??? vitamin E 1000 units capsule Take 1,000 Units by mouth daily     ??? calcium carbonate (OSCAL) 500 MG TABS tablet Take 500 mg by mouth daily     ??? lisinopril (PRINIVIL;ZESTRIL) 20 MG tablet Take 1 tablet by mouth daily 30 tablet 3   ??? atorvastatin (LIPITOR) 20 MG tablet Take 1 tablet by mouth daily 30 tablet 3   ??? vitamin D (CHOLECALCIFEROL) 50000 UNIT CAPS Take  1 capsule by mouth once a week 4 capsule 2       ALLERGIES    Allergies   Allergen Reactions   ??? Bee Venom        SOCIAL & FAMILY HISTORY    Social History     Social History   ??? Marital status: Divorced     Spouse name: N/A   ??? Number of children: N/A   ??? Years of education: N/A     Social History Main Topics   ??? Smoking status: Never Smoker   ??? Smokeless tobacco: Never Used   ??? Alcohol use No   ??? Drug use: Unknown   ??? Sexual activity: Not Asked     Other Topics Concern   ??? None     Social History Narrative   ??? None     History reviewed. No pertinent family history.      PHYSICAL EXAM    VITAL SIGNS: BP (!) 140/76    Pulse 79    Temp 97.3 ??F (36.3 ??C) (Oral)  Resp 16    Ht 5\' 2"  (1.575 m)    Wt 126 lb (57.2 kg)    SpO2 100%    BMI 23.05 kg/m??   Constitutional:  Well developed, Appears comfortable  HENT:  Atraumatic, Moist mucous membranes  Neck: normal range of motion, supple   Respiratory:  No retractions   Cardiovascular:  No JVD   Musculoskeletal:  Exam of the affected knee reveals atraumatic, no edema or deformity.  Extensor mechanism is intact (Patient is able to extend the leg against gravity, demonstrating that the quadriceps tendon and the patella tendon are intact.)  Vascular: DP pulses 2+ on the same side as the knee pain (distal to the affected knee).  Integument:  Well hydrated, no erythema or warmth of the affected knee  Neurologic:  Awake and alert, no slurred speech   Psychiatric: Cooperative, pleasant affect    RADIOLOGY/PROCEDURES    XR KNEE LEFT (MIN 4 VIEWS)   Final Result   Moderate tricompartmental osteoarthrosis without acute abnormality.             ED COURSE & MEDICAL DECISION MAKING    Pertinent Imaging studies reviewed and interpreted. (See EMR for details)  See electronic record for medications ordered.        Differential diagnosis: includes but not limited to Meniscus tear, ACL tear, tibial plateau fracture, extensor mechanism rupture, dislocation, Arterial Injury/Ischemia, Infection,  Neurologic Deficit/Injury.    No evidence of neurovascular injury on exam.  Plain films as above.  I explained to the patient that there may be ligamentous/meniscal injury not seen on plain films, and that they will require follow-up with orthopedics for further evaluation.     D-dimer negative.  Low index of suspicion for DVT.  No lower extremity edema.  Strong and symmetrical radial pulses.  No evidence of compartment syndrome or Baker cyst rupture.  Suspect this is just likely a result of her tricompartmental DJD with possible meniscus injury.     I believe the patient is safe for discharge at this time.  I'll prescribe oral analgesics and provide orthopedic referral.    The patient was instructed to follow up as an outpatient in 3 days. The patient was instructed to return to the ED immediately for any new or worsening symptoms.  The patient verbalized understanding.    FINAL IMPRESSION    1. Left knee pain, unspecified chronicity    2. Primary osteoarthritis of left knee        PLAN  Immobilization, partial weightbearing with crutches, outpatient medications, orthopedic follow-up    (Please note that this note was completed with a voice recognition program.  Every attempt was made to edit the dictations, but inevitably there remain words that are mis-transcribed.)     Earley Abide, MD  06/22/16 [phone]

## 2016-06-20 NOTE — ED Notes (Signed)
Discharge instructions reviewed,  Pt verbalized understanding  In wheelchair to exit w/ family     Leodis Rains, RN  06/20/16 [phone]

## 2016-06-20 NOTE — ED Notes (Signed)
Pt  Denies injury or fall.  No noticeable redness or swelling to L knee  Pt does have bruising noted to back of LUE  Labs drawn and sent  Pt updated on plan of care by MD     Baldo Ash, RN  06/20/16 [phone]

## 2016-06-20 NOTE — Unmapped (Signed)
Your patient was seen at a [facility]. Please go to [email or web address] to view information filed to your patient's chart in Epic.  If you need to view your patient's results prior to gaining access to   Epic CareLink, please contact the [facility] where your patient was seen.              Tericka, Stephanie Suarez #[id number] ([id number])  (83 y.o. F)   PCP: Tobie Poet [phone])    21-01         ED Arrival Information     Expected Arrival Acuity Means of Arrival Escorted By Service Admission Type    - 06/20/2016  3:14 AM 4-Less Urgent Personal Transport - Emergency Medicine Emergency      Arrival Complaint    LEG PAIN        Chief Complaint     Complaint Comment    Knee Pain L knee pain since Tuesday, denies injury        ED Vitals    Date/Time Temp Pulse Resp BP SpO2 Weight Who   06/20/16 0320 97.3 ????F (36.3 ????C) 79 16 (!)  140/76 100 % 126 lb (57.2 kg) SNB        Allergies (verified on: 06/20/16)     Agent Severity Comments    Bee Venom          Medical History     Past Medical History Date Comments    Hypertension [I10]      Hyperlipidemia [E78.5]      Vitamin D deficiency [E55.9]          Surgical History       Obstetric History    The patient has not been asked about pregnancy.     ED Provider Notes     Nadyne Coombes, MD 06/22/2016  5:52 AM                    [facility]  [address]  [place] [address]  Dept: [phone]  Loc: [phone]    eMERGENCY dEPARTMENT eNCOUnter        CHIEF COMPLAINT    Chief Complaint     Patient presents with     ??????? Knee Pain      L knee pain since Tuesday, denies injury       HPI    Stephanie Suarez is a 83 y.o. female who presents with Left knee pain over the last several days.  She does not remember a fall or trauma.  She denies any swelling either one of her knees.  She has known arthritis in both.  She has no back pain.  She has no   radicular symptoms.  No bowel or bladder  incontinence.  No lower extremity weakness.  She has not tried to treat this with any pain medications.  She denies history of DVT or PE.  She denies any swelling.  She denies asymmetric lower extremity edema.    REVIEW OF SYSTEMS    General: No fever or chills  Skin: No Rashes or redness of the skin  GU: No dysuria or hematuria  Musculoskeletal: See HPI    PAST MEDICAL & SURGICAL HISTORY    Past Medical History:     Diagnosis  Date   ??????? Hyperlipidemia    ??????? Hypertension    ??????? Vitamin D deficiency      History reviewed. No pertinent surgical history.  CURRENT MEDICATIONS  (may include discharge medications prescribed in the ED)  Current Outpatient Rx       Medication  Sig Dispense Refill   ??????? acetaminophen (APAP EXTRA STRENGTH) 500 MG tablet 1 tablet by mouth with breakfast, lunch, dinner, and before bedtime every day for the next 5 days 20 tablet 0   ??????? Calcium Ascorbate 500 MG TABS Take 500 mg by mouth     ??????? vitamin B-1 (THIAMINE) 100 MG tablet Take 100 mg by mouth daily     ??????? vitamin E 1000 units capsule Take 1,000 Units by mouth daily     ??????? calcium carbonate (OSCAL) 500 MG TABS tablet Take 500 mg by mouth daily     ??????? lisinopril (PRINIVIL;ZESTRIL) 20 MG tablet Take 1 tablet by mouth daily 30 tablet 3   ??????? atorvastatin (LIPITOR) 20 MG tablet Take 1 tablet by mouth daily 30 tablet 3   ??????? vitamin D (CHOLECALCIFEROL) 50000 UNIT CAPS Take 1 capsule by mouth once a week 4 capsule 2       ALLERGIES    Allergies     Allergen  Reactions   ??????? Bee Venom        SOCIAL & FAMILY HISTORY    Social History     Social History      ??????? Marital status:  Divorced     Spouse name: N/A   ??????? Number of children:  N/A   ??????? Years of education:  N/A     Social History Main Topics     ??????? Smoking status: Never Smoker   ??????? Smokeless tobacco: Never Used   ??????? Alcohol use No   ??????? Drug use: Unknown   ??????? Sexual activity: Not Asked     Other Topics  Concern   ??????? None      Social History Narrative    ??????? None     History  reviewed. No pertinent family history.      PHYSICAL EXAM    VITAL SIGNS: BP (!) 140/76    Pulse 79    Temp 97.3 ????F (36.3 ????C) (Oral)    Resp 16    Ht 5' 2 (1.575 m)    Wt 126 lb (57.2 kg)    SpO2 100%    BMI 23.05 kg/m????   Constitutional:  Well developed, Appears comfortable  HENT:  Atraumatic, Moist mucous membranes  Neck: normal range of motion, supple   Respiratory:  No retractions   Cardiovascular:  No JVD   Musculoskeletal:  Exam of the affected knee reveals atraumatic, no edema or deformity.  Extensor mechanism is intact (Patient is able to extend the leg against gravity, demonstrating that the quadriceps tendon and the patella tendon are intact.)  Vascular: DP pulses 2+ on the same side as the knee pain (distal to the affected knee).  Integument:  Well hydrated, no erythema or warmth of the affected knee  Neurologic:  Awake and alert, no slurred speech   Psychiatric: Cooperative, pleasant affect    RADIOLOGY/PROCEDURES    XR KNEE LEFT (MIN 4 VIEWS)   Final Result   Moderate tricompartmental osteoarthrosis without acute abnormality.             ED COURSE & MEDICAL DECISION MAKING    Pertinent Imaging studies reviewed and interpreted. (See EMR for details)  See electronic record for medications ordered.        Differential diagnosis: includes but not limited to Meniscus tear, ACL tear, tibial plateau fracture, extensor  mechanism rupture, dislocation, Arterial Injury/Ischemia, Infection, Neurologic Deficit/Injury.    No evidence of neurovascular injury on exam.  Plain films as above.  I explained to the patient that there may be ligamentous/meniscal injury not seen on plain films, and that they will require follow-up with orthopedics for further evaluation.     D-dimer negative.  Low index of suspicion for DVT.  No lower extremity edema.  Strong and symmetrical radial pulses.  No evidence of compartment syndrome or Baker cyst rupture.  Suspect this is just likely a result of her tricompartmental DJD with    possible meniscus injury.     I believe the patient is safe for discharge at this time.  I'll prescribe oral analgesics and provide orthopedic referral.    The patient was instructed to follow up as an outpatient in 3 days. The patient was instructed to return to the ED immediately for any new or worsening symptoms.  The patient verbalized understanding.    FINAL IMPRESSION    1. Left knee pain, unspecified chronicity    2. Primary osteoarthritis of left knee        PLAN  Immobilization, partial weightbearing with crutches, outpatient medications, orthopedic follow-up    (Please note that this note was completed with a voice recognition program.  Every attempt was made to edit the dictations, but inevitably there remain words that are mis-transcribed.)     Nadyne Coombes, MD  06/22/16 [phone]          ED Medication Orders     Start Ordered     Status Ordering Provider    06/20/16 [phone] 06/20/16 0333  HYDROcodone-acetaminophen (NORCO) 5-325 MG per tablet 1 tablet  ONCE     Last MAR action:  Given - by Susann Givens NICOLE on 06/20/16 at 0441 Karsten Fells        Lab Results          SPECIMEN REJECTION (Final result)  Result time 06/20/16 06:04:29    Collection Time Result Time Rejected Test Reason for Rejection   06/20/16 06:03:00 06/20/16 06:04:00 cmp see below Unable to perform testing; specimen grossly hemolyzed. To perform   testing the specimen will need to be recollected. Hemolyz            Final result            Narrative:   CALL  [facility]  SFER[facility] tel. [phone], Rejected Test Name/Called to:CMP/ RN Orie Fisherman, 06/20/2016 06:04, by Jannetta Quint Performed at: [facility] [address],  [place], [place] [address]   Phone [phone]                       CBC (Final result)     Abnormal  Result time 06/20/16 05:31:58    Collection Time Result Time WBC RBC HGB HCT MCV MCH MCHC RDW PLT MPV   06/20/16 05:24:00 06/20/16 05:31:00 4.4 (!) 3.72 (L) 12.4 37.8 (!) 101.4 (H) 33.3 32.9 13.3 (!)  131 (L) 9.2       Previous Results   06/18/13 15:40:00 06/19/13 02:51:00 5.1 (!) 3.77 (L) 12.8 38.8 (!) 102.9 (H) 33.9 32.9 13.4 (!) 97 (L) (!) 13.0 (H)   06/02/13 11:24:00 06/02/13 11:45:00 6.2 (!) 3.93 (L) 13.4 40.2 (!) 102.3 (H) 34.0 33.3 13.4 157 9.3   09/06/12   13:05:00 09/06/12 14:22:00 4.7 (!) 3.62 (L) 12.0 36.1 99.8 33.3 33.3 12.6 143 8.8  Collection Time Result Time PLATELET SLIDE REVIEW EOS ABS BASOS ABS NEUTRO PCT LYMPHO PCT MONO PCT EOS BASOS PCT NEUTRO ABS LYMPHS ABS   06/20/16 05:24:00 06/20/16   05:31:00               Previous Results   06/18/13 15:40:00 06/19/13 02:51:00 Decreased 0.1 0.0 62.9 26.9 8.2 1.8 0.2 3.2 1.4   06/02/13 11:24:00 06/02/13 11:45:00             09/06/12 13:05:00 09/06/12 14:22:00  0.0 0.0 68.0 23.7 7.1 0.8 0.4 3.2 1.1         Collection Time Result Time MONOS ABS   06/20/16 05:24:00 06/20/16 05:31:00      Previous Results   06/18/13 15:40:00 06/19/13 02:51:00 0.4   06/02/13 11:24:00 06/02/13 11:45:00    09/06/12 13:05:00 09/06/12 14:22:00 0.3           Final result              Narrative:   Performed at: [facility] [address],  [place], [place] [address]   Phone [phone]                       Protime-INR (Final result)  Result time 06/20/16 05:40:23    Collection Time Result Time Protime INR   06/20/16 05:24:00 06/20/16 05:40:00 11.2 Effective 05-11-15 9:00am EST Please note reference ranges have changed for PT and INR Testing.  0.99   Effective 10/25/2015 at 3pm EST  Normal: 0.86 - 1.14 Therapeutic: 2.0 - 3.0 Pros. Valve: 2.5 - 3.5 AMI: 2.0 - 3.0      Previous Results   09/06/12 13:05:00 09/06/12 14:35:00 11.0 0.98 Normal: 0.85 - 1.15 Therapeutic: 2.0 - 3.0 Pros. Valve: 2.5 - 3.5             Final result            Narrative:   Performed at: [facility] [address],  [place], [place] [address]   Phone [phone]                       APTT (Final result)     Abnormal  Result time 06/20/16 05:40:37     Collection Time Result Time aPTT   06/20/16 05:24:00 06/20/16 05:40:00 (!) 23.7 (L) Therapeutic range: 52.4 - 87.3 sec  Effective 10-05-15 9:00am EST Please note reference ranges have   changed for PTT Testing.      Previous Results   09/06/12 13:05:00 09/06/12 14:35:00 34.3 Therapeutic range: 54.0 - 90.0 sec           Final result            Narrative:   Performed at: [facility] [address]    [place], [place] [address]   Phone [phone]                       D-Dimer, Quantitative (Final result)  Result time 06/20/16 05:40:23    Collection Time Result Time D-Dimer, Quant   06/20/16 05:24:00 06/20/16 05:40:00 221 HemosIL D-Dimer is an automated latex enhanced immunoassay for the quantitative determination of   D-dimer in human citrated plasma on IL Coagulation systems for use in conjunction with a clinical pretest probability(PTP) assessment model to exclude venous thromboembolism(VTE) in outpatients suspected of deep venous thrombosis (DVT) and pulmonary   embolism(PE). The reference  range for D-Dimer is <230 ng/ml DDU. Results <230 ng/ml DDU can be used as a negative predictor in patients with low and moderate probability of DVT/PE. D-Dimer levels are mainly elevated in cases of DVT/PE.  Other conditions   may lead to a high D-Dimer level including old age, pregnancy, peripheral arteriopathy, DIC, coronary disease, thrombolytic treatment, cancer, liver disease, infection, inflammation, hematoma, and rheumatoid arthritis.  Specimen interferences are created   by lipemia, bilirubin, and hemolysis.            Final result            Narrative:   Performed at: [facility] [address]  [place], [place] [address]   Phone [phone]                    Imaging Results          XR KNEE LEFT (MIN 4 VIEWS) (Final result)  Result time 06/20/16 04:33:30   Final result by Hezzie Bump, MD (06/20/16 04:33:30)            Impression:   Moderate  tricompartmental osteoarthrosis without acute abnormality.          Narrative:     EXAMINATION: 4 VIEWS OF THE LEFT KNEE  06/20/2016 3:50 am  COMPARISON: None.  HISTORY: ORDERING PHYSICIAN PROVIDED HISTORY: Injury TECHNOLOGIST PROVIDED HISTORY: Technologist Provided Reason for Exam: L/KNEE PAIN Acuity: Acute Type of Encounter: Initial    FINDINGS: No acute fracture or dislocation.  Moderate tricompartment joint space narrowing and osteophyte formation.  No significant effusion.  Soft tissues are unremarkable.                     ED Administered Medications from 06/20/2016 0314 to 06/22/2016 3664       Date/Time Order Dose Route Action Action by Comments     06/20/2016 0441 HYDROcodone-acetaminophen (NORCO) 5-325 MG per tablet 1 tablet 1 tablet Oral Given Baldo Ash, RN      06/20/2016 0441 lidocaine (LIDODERM) 5 % 1 patch 1 patch Transdermal Patch Applied Baldo Ash, RN         ED Treatment Team     Provider Role From To Phone Pager    Nadyne Coombes, MD Attending Provider 06/20/16 [phone] -- [phone]         Code Onset/Outcome    None     Code Interventions/Drips/Airways    None     Diagnoses     Diagnosis Comment    Left knee pain, unspecified chronicity     Primary osteoarthritis of left knee         ED Prescriptions     Medication Sig Dispense Start Date End Date Auth. Provider    acetaminophen (APAP EXTRA STRENGTH) 500 MG tablet 1 tablet by mouth with breakfast, lunch, dinner, and before bedtime every day for the next 5 days 20 tablet 06/20/2016  Nadyne Coombes, MD        Follow-up Information     Follow up With Specialties Details Why Contact Info    Rajan Burgard Jackquline [facility] Medicine Schedule an appointment as soon as possible for a visit   [address] [place] [address] [phone]         Discharge Instructions    None

## 2016-08-08 ENCOUNTER — Emergency Department (HOSPITAL_COMMUNITY): Payer: Medicare Other

## 2016-08-08 ENCOUNTER — Emergency Department (HOSPITAL_BASED_OUTPATIENT_CLINIC_OR_DEPARTMENT_OTHER)
Admit: 2016-08-08 | Discharge: 2016-08-08 | Disposition: A | Discharge status: Home / Self Care | Payer: Medicare Other | Attending: Emergency Medicine | Admitting: Emergency Medicine

## 2016-08-08 ENCOUNTER — Emergency Department (HOSPITAL_COMMUNITY)
Admission: EM | Admit: 2016-08-08 | Discharge: 2016-08-08 | Disposition: A | Discharge status: Home / Self Care | Payer: Medicare Other | Attending: Emergency Medicine | Admitting: Emergency Medicine

## 2016-08-08 ENCOUNTER — Encounter (HOSPITAL_COMMUNITY): Payer: Self-pay | Admitting: *Deleted

## 2016-08-08 DIAGNOSIS — R072 Precordial pain: Secondary | ICD-10-CM

## 2016-08-08 DIAGNOSIS — M79609 Pain in unspecified limb: Secondary | ICD-10-CM

## 2016-08-08 DIAGNOSIS — M25562 Pain in left knee: Secondary | ICD-10-CM | POA: Diagnosis not present

## 2016-08-08 DIAGNOSIS — I10 Essential (primary) hypertension: Secondary | ICD-10-CM | POA: Insufficient documentation

## 2016-08-08 DIAGNOSIS — G8929 Other chronic pain: Secondary | ICD-10-CM | POA: Insufficient documentation

## 2016-08-08 HISTORY — DX: Pure hypercholesterolemia, unspecified: E78.00

## 2016-08-08 HISTORY — DX: Essential (primary) hypertension: I10

## 2016-08-08 LAB — I-STAT TROPONIN, ED
TROPONIN I, POC: 0 ng/mL (ref 0.00–0.08)
Troponin i, poc: 0 ng/mL (ref 0.00–0.08)

## 2016-08-08 LAB — BASIC METABOLIC PANEL
ANION GAP: 6 (ref 5–15)
BUN: 18 mg/dL (ref 6–20)
CALCIUM: 9.4 mg/dL (ref 8.9–10.3)
CO2: 30 mmol/L (ref 22–32)
Chloride: 105 mmol/L (ref 101–111)
Creatinine, Ser: 1.09 mg/dL — ABNORMAL HIGH (ref 0.44–1.00)
GFR calc non Af Amer: 45 mL/min — ABNORMAL LOW (ref >60)
GFR, EST AFRICAN AMERICAN: 52 mL/min — AB (ref >60)
GLUCOSE: 109 mg/dL — AB (ref 65–99)
POTASSIUM: 4.1 mmol/L (ref 3.5–5.1)
SODIUM: 141 mmol/L (ref 135–145)

## 2016-08-08 LAB — CBC
HEMATOCRIT: 37.8 % (ref 36.0–46.0)
HEMOGLOBIN: 12.4 g/dL (ref 12.0–15.0)
MCH: 33 pg (ref 26.0–34.0)
MCHC: 32.8 g/dL (ref 30.0–36.0)
MCV: 100.5 fL — ABNORMAL HIGH (ref 78.0–100.0)
Platelets: 135 10*3/uL — ABNORMAL LOW (ref 150–400)
RBC: 3.76 MIL/uL — AB (ref 3.87–5.11)
RDW: 12.4 % (ref 11.5–15.5)
WBC: 4.1 10*3/uL (ref 4.0–10.5)

## 2016-08-08 MED ORDER — TRAMADOL HCL 50 MG PO TABS: 50.0000 mg | ORAL_TABLET | Freq: Four times a day (QID) | ORAL | 0 refills | Status: DC | PRN

## 2016-08-08 NOTE — ED Triage Notes (Signed)
Pt reports mid chest pain this am, feels like indigestion. Denies sob or n/v. Reports left posterior knee pain x 1 month, denies injury. No acute distress is noted at triage.

## 2016-08-08 NOTE — Progress Notes (Signed)
VASCULAR LAB PRELIMINARY  PRELIMINARY  PRELIMINARY  PRELIMINARY  Left lower extremity venous duplex completed.    Preliminary report:  Left:  No evidence of DVT, superficial thrombosis, or Baker's cyst.  Bari Handshoe, RVS 08/08/2016, 10:10 AM

## 2016-08-08 NOTE — ED Provider Notes (Signed)
[facility]-EMERGENCY DEPT Provider Note   CSN: [id number] Arrival date & time: 08/08/16  0757     History   Chief Complaint Chief Complaint  Patient presents with  . Chest Pain  . Knee Pain    HPI Traci Hernandez is a 83 y.o. female.  The patient with acute onset of chest pain shortly prior to arrival. They think it lasted only 5 minutes. Patient is visiting here from [place]. Will be here for several weeks. No nausea no vomiting no diarrhea no shortness of breath. Feel that the onset was around 710 this morning. Location was substernal. Patient also with one-month history of left knee pain and swelling to the left leg area. Reportedly evaluated in [place] without any significant findings. Patient has not followed up with orthopedics. Patient's also had chest pain in the past has not followed up with cardiology. Patient does not have any cardiac stents or has not had bypass surgery.      Past Medical History:  Diagnosis Date  . High cholesterol   . Hypertension     There are no active problems to display for this patient.   History reviewed. No pertinent surgical history.  OB History    No data available       Home Medications    Prior to Admission medications   Medication Sig Start Date End Date Taking? Authorizing Provider  traMADol (ULTRAM) 50 MG tablet Take 1 tablet (50 mg total) by mouth every 6 (six) hours as needed. 08/08/16   Vanetta Mulders, MD    Family History History reviewed. No pertinent family history.  Social History Social History  Substance Use Topics  . Smoking status: Never Smoker  . Smokeless tobacco: Not on file  . Alcohol use No     Allergies   Patient has no known allergies.   Review of Systems Review of Systems  Constitutional: Negative for fever.  HENT: Negative for congestion.   Eyes: Negative for visual disturbance.  Respiratory: Negative for shortness of breath.   Cardiovascular: Positive for chest pain and leg swelling.    Gastrointestinal: Negative for abdominal pain, nausea and vomiting.  Musculoskeletal: Negative for back pain.  Skin: Negative for wound.  Neurological: Negative for headaches.  Hematological: Does not bruise/bleed easily.  Psychiatric/Behavioral: Negative for confusion.     Physical Exam Updated Vital Signs BP (!) 133/57   Pulse 67   Temp 98.4 F (36.9 C) (Oral)   Resp 15   Ht 5\' 2"  (1.575 m)   Wt 56.2 kg   SpO2 99%   BMI 22.68 kg/m   Physical Exam  Constitutional: She is oriented to person, place, and time. She appears well-developed and well-nourished. No distress.  HENT:  Head: Normocephalic and atraumatic.  Mouth/Throat: Oropharynx is clear and moist.  Eyes: EOM are normal. Pupils are equal, round, and reactive to light.  Neck: Normal range of motion. Neck supple.  Cardiovascular: Normal rate, regular rhythm and normal heart sounds.   Pulmonary/Chest: Effort normal and breath sounds normal. No respiratory distress.  Abdominal: Soft. Bowel sounds are normal. There is no tenderness.  Musculoskeletal: Normal range of motion. She exhibits edema and tenderness.  Neurological: She is alert and oriented to person, place, and time. No cranial nerve deficit or sensory deficit. She exhibits normal muscle tone. Coordination normal.  Skin: Skin is warm.  Nursing note and vitals reviewed.    ED Treatments / Results  Labs (all labs ordered are listed, but only abnormal results are displayed) Labs  Reviewed  BASIC METABOLIC PANEL - Abnormal; Notable for the following:       Result Value   Glucose, Bld 109 (*)    Creatinine, Ser 1.09 (*)    GFR calc non Af Amer 45 (*)    GFR calc Af Amer 52 (*)    All other components within normal limits  CBC - Abnormal; Notable for the following:    RBC 3.76 (*)    MCV 100.5 (*)    Platelets 135 (*)    All other components within normal limits  I-STAT TROPOININ, ED  I-STAT TROPOININ, ED    EKG  EKG  Interpretation  Date/Time:  Wednesday Aug 08 2016 08:04:45 EDT Ventricular Rate:  70 PR Interval:  166 QRS Duration: 54 QT Interval:  374 QTC Calculation: 403 R Axis:   78 Text Interpretation:  Normal sinus rhythm Anteroseptal infarct , age undetermined Abnormal ECG No previous ECGs available Interpretation limited secondary to artifact Confirmed by Vanetta MuldersZackowski, Dail Lerew [id number]) on 08/08/2016 8:17:12 AM       Radiology Dg Chest 2 View  Result Date: 08/08/2016 CLINICAL DATA:  Chest pain and hypertension EXAM: CHEST  2 VIEW COMPARISON:  None. FINDINGS: There are nipple shadows bilaterally. There is no edema or consolidation. Heart size and pulmonary vascularity are normal. There is a calcified sub- carinal lymph node. No adenopathy. There is aortic atherosclerosis. There is mild degenerative change in the thoracic spine. IMPRESSION: Evidence prior granulomatous disease with calcified sub- carinal lymph node. No adenopathy. No edema or consolidation. There is aortic atherosclerosis. Electronically Signed   By: Bretta BangWilliam  Woodruff III M.D.   On: 08/08/2016 08:49    Procedures Procedures (including critical care time)  Medications Ordered in ED Medications - No data to display   Initial Impression / Assessment and Plan / ED Course  I have reviewed the triage vital signs and the nursing notes.  Pertinent labs & imaging results that were available during my care of the patient were reviewed by me and considered in my medical decision making (see chart for details).     Patient with substernal chest pain lasting 5 minutes however wasn't certain about time so 2 troponins were checked both were negative. Patient with complaint of swelling and pain to the left leg and knee area for a month. Ultrasound of that area without evidence of deep pain thrombosis. Rest of chest pain workup without any acute findings. Patient stable for discharge home. Patient now in this area. Recommending cardiology follow-up  in orthopedic follow-up. We'll start the patient on tramadol for the knee pain.  Patient will need to return for any new or worse chest pain.  Final Clinical Impressions(s) / ED Diagnoses   Final diagnoses:  Precordial pain  Chronic pain of left knee    New Prescriptions New Prescriptions   TRAMADOL (ULTRAM) 50 MG TABLET    Take 1 tablet (50 mg total) by mouth every 6 (six) hours as needed.     Vanetta MuldersZackowski, Johnae Friley, MD 08/08/16 1308

## 2016-08-08 NOTE — Discharge Instructions (Signed)
Make appointment to follow-up with cardiology. Return for any recurrent chest pain lasting 15 minutes or longer. Today's workup without any acute findings. Regarding the left knee no evidence of a blood clot. Make an appointment to follow-up with orthopedics for further evaluation. Take the tramadol as needed for the knee pain.

## 2016-08-08 NOTE — ED Notes (Signed)
Pt requesting something hot to drink.

## 2016-08-08 NOTE — ED Notes (Signed)
Pt offered assistance dressing and offered wheelchair, pt declined both. Ambulatory at DC. Pt being driven home by family. VSS NAD.

## 2016-08-08 NOTE — ED Notes (Signed)
Pt returned to room from xray.

## 2016-08-09 ENCOUNTER — Ambulatory Visit (INDEPENDENT_AMBULATORY_CARE_PROVIDER_SITE_OTHER): Payer: Medicare Other | Admitting: Orthopaedic Surgery

## 2016-08-09 ENCOUNTER — Encounter (INDEPENDENT_AMBULATORY_CARE_PROVIDER_SITE_OTHER): Payer: Self-pay | Admitting: Orthopaedic Surgery

## 2016-08-09 ENCOUNTER — Ambulatory Visit (INDEPENDENT_AMBULATORY_CARE_PROVIDER_SITE_OTHER): Payer: Medicare Other

## 2016-08-09 DIAGNOSIS — M1712 Unilateral primary osteoarthritis, left knee: Secondary | ICD-10-CM

## 2016-08-09 MED ORDER — BUPIVACAINE HCL 0.5 % IJ SOLN
2.0000 mL | INTRAMUSCULAR | Status: AC | PRN
  Administered 2016-08-09: 2 mL via INTRA_ARTICULAR

## 2016-08-09 MED ORDER — METHYLPREDNISOLONE ACETATE 40 MG/ML IJ SUSP
40.0000 mg | INTRAMUSCULAR | Status: AC | PRN
  Administered 2016-08-09: 40 mg via INTRA_ARTICULAR

## 2016-08-09 MED ORDER — LIDOCAINE HCL 1 % IJ SOLN
2.0000 mL | INTRAMUSCULAR | Status: AC | PRN
  Administered 2016-08-09: 2 mL

## 2016-08-09 NOTE — Progress Notes (Signed)
Office Visit Note   Patient: Traci Hernandez           Date of Birth: Jul 21, 1929           MRN: [id number] Visit Date: 08/09/2016              Requested by: No referring provider defined for this encounter. PCP: System, Pcp Not In   Assessment & Plan: Visit Diagnoses:  1. Primary osteoarthritis of left knee     Plan: DVT study in the ER was negative. Impression is left knee pain possibly arthritis flare. Left knee injection was performed.  Follow-up with me as needed.  Follow-Up Instructions: Return if symptoms worsen or fail to improve.   Orders:  Orders Placed This Encounter  Procedures  . XR KNEE 3 VIEW LEFT   No orders of the defined types were placed in this encounter.     Procedures: Large Joint Inj Date/Time: 08/09/2016 9:02 AM Performed by: Tarry KosXU, NAIPING M Authorized by: Tarry KosXU, NAIPING M   Consent Given by:  Patient Timeout: prior to procedure the correct patient, procedure, and site was verified   Indications:  Pain Location:  Knee Site:  R knee Prep: patient was prepped and draped in usual sterile fashion   Needle Size:  22 G Ultrasound Guidance: No   Fluoroscopic Guidance: No   Arthrogram: No   Medications:  2 mL lidocaine 1 %; 2 mL bupivacaine 0.5 %; 40 mg methylPREDNISolone acetate 40 MG/ML Patient tolerance:  Patient tolerated the procedure well with no immediate complications     Clinical Data: No additional findings.   Subjective: Chief Complaint  Patient presents with  . Left Knee - Pain    Patient is a 83 year old female with a four-month history of insidious onset of left knee pain mainly posteriorly. She denies any injuries. Denies any swelling. Worse with activity. The pain does not radiate. Tramadol gives partial relief. Denies any constitutional symptoms.    Review of Systems  Constitutional: Negative.   HENT: Negative.   Eyes: Negative.   Respiratory: Negative.   Cardiovascular: Negative.   Endocrine: Negative.     Musculoskeletal: Negative.   Neurological: Negative.   Hematological: Negative.   Psychiatric/Behavioral: Negative.   All other systems reviewed and are negative.    Objective: Vital Signs: There were no vitals taken for this visit.  Physical Exam  Constitutional: She is oriented to person, place, and time. She appears well-developed and well-nourished.  HENT:  Head: Normocephalic and atraumatic.  Eyes: EOM are normal.  Neck: Neck supple.  Pulmonary/Chest: Effort normal.  Abdominal: Soft.  Neurological: She is alert and oriented to person, place, and time.  Skin: Skin is warm. Capillary refill takes less than 2 seconds.  Psychiatric: She has a normal mood and affect. Her behavior is normal. Judgment and thought content normal.  Nursing note and vitals reviewed.   Ortho Exam Left knee exam shows no joint effusion. She has no joint line tenderness. She is complaining mainly pain in the back of the knee. There is no palpable defects. Specialty Comments:  No specialty comments available.  Imaging: No results found.   PMFS History: Patient Active Problem List   Diagnosis Date Noted  . Primary osteoarthritis of left knee 08/09/2016   Past Medical History:  Diagnosis Date  . High cholesterol   . Hypertension     No family history on file.  No past surgical history on file. Social History   Occupational History  . Not on  file.   Social History Main Topics  . Smoking status: Never Smoker  . Smokeless tobacco: Never Used  . Alcohol use No  . Drug use: No  . Sexual activity: Not on file

## 2016-08-13 ENCOUNTER — Ambulatory Visit (INDEPENDENT_AMBULATORY_CARE_PROVIDER_SITE_OTHER): Payer: Medicare Other | Admitting: Cardiovascular Disease

## 2016-08-13 ENCOUNTER — Encounter: Payer: Self-pay | Admitting: Cardiovascular Disease

## 2016-08-13 VITALS — BP 143/76 | HR 69 | Ht 62.0 in | Wt 119.4 lb

## 2016-08-13 DIAGNOSIS — R0789 Other chest pain: Secondary | ICD-10-CM

## 2016-08-13 DIAGNOSIS — Z79899 Other long term (current) drug therapy: Secondary | ICD-10-CM

## 2016-08-13 DIAGNOSIS — R0602 Shortness of breath: Secondary | ICD-10-CM

## 2016-08-13 DIAGNOSIS — I10 Essential (primary) hypertension: Secondary | ICD-10-CM

## 2016-08-13 DIAGNOSIS — M1712 Unilateral primary osteoarthritis, left knee: Secondary | ICD-10-CM

## 2016-08-13 DIAGNOSIS — E785 Hyperlipidemia, unspecified: Secondary | ICD-10-CM | POA: Diagnosis not present

## 2016-08-13 NOTE — Progress Notes (Signed)
Cardiology Office Note    Date:  08/14/2016   ID:  Traci Charonnn Shatzer, DOB 1929/04/09, MRN [id number]  PCP:  System, Pcp Not In  Cardiologist:  Nicki Guadalajarahomas Devika Dragovich, MD    New patient Evaluation for chest pain  History of Present Illness:  Traci Hernandez is a 83 y.o. female who resides in [place], [place].  She is in  [place] visiting  one of her sons who is in the hospital.  She was recently evaluated in the emergency room with chest pain and is referred for cardiology evaluation.  Traci Hernandez denies any awareness of known previous CAD.  She has experienced episodic chest pain which at times has been atypical.  She also has admitted to intermittent left knee pain and swelling.  She was evaluated in [place] without significant findings.  She was recently evaluated in the emergency room on 08/08/2016.  She describes some sternal chest pain lasting 5 minutes.  Troponins were negative 2.  She did not have any deep vein thrombosis by ultrasound to lower extremity.  A chest x-ray showed prior granulomatous disease with calcified subcarinal lymph node.  There was no adenopathy edema or consolidation.  However, she was noted to have aortic atherosclerosis.  Because of her chest pain symptomatology.  She is now referred for cardiology evaluation.  Upon further questioning, she states that she has experienced exertional shortness of breath and this typically is associated with a chest pressure sensation with exertion.  This is different than the chest pain that she went to the emergency room with.  She seemed to relate that her episodes of chest pain have also been related to her knee.  She is unaware of palpitations.  She has a history of hyperlipidemia and has been on atorvastatin 20 mg as well as a history of hypertension on lisinopril 20 mg daily.   She presents for evaluation.   Past Medical History:  Diagnosis Date  . High cholesterol   . Hypertension     No past surgical history on file.  Current  Medications: Outpatient Medications Prior to Visit  Medication Sig Dispense Refill  . traMADol (ULTRAM) 50 MG tablet Take 1 tablet (50 mg total) by mouth every 6 (six) hours as needed. 20 tablet 0   No facility-administered medications prior to visit.      Allergies:   Bee venom   Social History   Social History  . Marital status: Single    Spouse name: N/A  . Number of children: N/A  . Years of education: N/A   Social History Main Topics  . Smoking status: Never Smoker  . Smokeless tobacco: Never Used  . Alcohol use No  . Drug use: No  . Sexual activity: Not Asked   Other Topics Concern  . None   Social History Narrative  . None    Socially she lives in [place], [place].  She lives with son.  One son is currently in the hospital here [place].  She is retired Chief Strategy Officernurse assistant.  She admits to walking on a daily basis up to 15 minutes.  Family History:  Family history is notable that her parents are deceased.  Her father had cancer.  She does not know the cause of her mother's death.  She has a deceased brother.  She has 3 children.  ROS General: Negative; No fevers, chills, or night sweats;  HEENT: Negative; No changes in vision or hearing, sinus congestion, difficulty swallowing Pulmonary: Negative; No cough, wheezing, shortness of breath, hemoptysis  Cardiovascular: See history of present illness GI: Negative; No nausea, vomiting, diarrhea, or abdominal pain GU: Negative; No dysuria, hematuria, or difficulty voiding Musculoskeletal: Positive left knee discomfort. Hematologic/Oncology: Negative; no easy bruising, bleeding Endocrine: Negative; no heat/cold intolerance; no diabetes Neuro: Negative; no changes in balance, headaches Skin: Negative; No rashes or skin lesions Psychiatric: Negative; No behavioral problems, depression Sleep: Negative; No snoring, daytime sleepiness, hypersomnolence, bruxism, restless legs, hypnogognic hallucinations, no cataplexy She is not  sleepy.  An Epworth Sleepiness Scale score was calculated in the office today and this endorsed at 9. Other comprehensive 14 point system review is negative.   PHYSICAL EXAM:   VS:  BP (!) 143/76 (BP Location: Left Arm)   Pulse 69   Ht 5\' 2"  (1.575 m)   Wt 119 lb 6.4 oz (54.2 kg)   BMI 21.84 kg/m     Repeat blood pressure was 139/69  Wt Readings from Last 3 Encounters:  08/13/16 119 lb 6.4 oz (54.2 kg)  08/08/16 124 lb (56.2 kg)    General: Alert, oriented, no distress.  Skin: normal turgor, no rashes, warm and dry HEENT: Normocephalic, atraumatic. Pupils equal round and reactive to light; sclera anicteric; extraocular muscles intact; Fundi No hemorrhages or exudates. Nose without nasal septal hypertrophy Mouth/Parynx benign; Mallinpatti scale 3 Neck: No JVD, no carotid bruits; normal carotid upstroke Lungs: clear to ausculatation and percussion; no wheezing or rales Chest wall: without tenderness to palpitation Heart: PMI not displaced, RRR, s1 s2 normal, 1/6 systolic murmur, no diastolic murmur, no rubs, gallops, thrills, or heaves Abdomen: soft, nontender; no hepatosplenomehaly, BS+; abdominal aorta nontender and not dilated by palpation. Back: no CVA tenderness Pulses 2+ Musculoskeletal: full range of motion, normal strength, no joint deformities Extremities: no clubbing cyanosis or edema, Homan's sign negative  Neurologic: grossly nonfocal; Cranial nerves grossly wnl Psychologic: Normal mood and affect   Studies/Labs Reviewed:   EKG:  EKG is ordered today.  ECG (independently read by me): Normal sinus rhythm at 63 bpm.  Normal intervals.  No significant ST segment changes.    Recent Labs: BMP Latest Ref Rng & Units 08/08/2016  Glucose 65 - 99 mg/dL 161(W)  BUN 6 - 20 mg/dL 18  Creatinine 9.60 - 4.54 mg/dL 0.98(J)  Sodium 191 - 478 mmol/L 141  Potassium 3.5 - 5.1 mmol/L 4.1  Chloride 101 - 111 mmol/L 105  CO2 22 - 32 mmol/L 30  Calcium 8.9 - 10.3 mg/dL 9.4      No flowsheet data found.  CBC Latest Ref Rng & Units 08/08/2016  WBC 4.0 - 10.5 K/uL 4.1  Hemoglobin 12.0 - 15.0 g/dL 29.5  Hematocrit 62.1 - 46.0 % 37.8  Platelets 150 - 400 K/uL 135(L)   Lab Results  Component Value Date   MCV 100.5 (H) 08/08/2016   No results found for: TSH No results found for: HGBA1C   BNP No results found for: BNP  ProBNP No results found for: PROBNP   Lipid Panel  No results found for: CHOL, TRIG, HDL, CHOLHDL, VLDL, LDLCALC, LDLDIRECT   RADIOLOGY: Dg Chest 2 View  Result Date: 08/08/2016 CLINICAL DATA:  Chest pain and hypertension EXAM: CHEST  2 VIEW COMPARISON:  None. FINDINGS: There are nipple shadows bilaterally. There is no edema or consolidation. Heart size and pulmonary vascularity are normal. There is a calcified sub- carinal lymph node. No adenopathy. There is aortic atherosclerosis. There is mild degenerative change in the thoracic spine. IMPRESSION: Evidence prior granulomatous disease with calcified sub- carinal lymph  node. No adenopathy. No edema or consolidation. There is aortic atherosclerosis. Electronically Signed   By: Bretta Bang III M.D.   On: 08/08/2016 08:49   Xr Knee 3 View Left  Result Date: 08/10/2016 Advanced DJD    Additional studies/ records that were reviewed today include:  I reviewed her [facility] evaluation.  Laboratory and chest x-ray were reviewed.    ASSESSMENT:    1. Chest tightness   2. SOB (shortness of breath) on exertion   3. Hyperlipidemia, unspecified hyperlipidemia type   4. Medication management   5. Essential hypertension   6. Osteoarthritis of left knee, unspecified osteoarthritis type      PLAN:  Traci Hernandez is an 83 year old who currently resides in [place] with one of her sons.  She has been visiting [place] to see one son who is in the hospital and is with her other son who lives years well.  He has experienced some episodes of sharp pain which  lasted less than 5 minutes, but in addition to this discomfort.  She also admits to a different substernal chest pressure associated with exertional shortness of breath, which is clearly different.  She recently has had some knee discomfort didn't x-ray showed advanced DJD.  I reviewed her ER evaluation.  Troponins were negative.  She has a history of hypertension and has been on lisinopril 20 mg and also a history of hyperlipidemia on atorvastatin 20 mg.  Her most recent chest x-ray suggests aortic atherosclerosis.  He does have a cardiac murmur.  I'm scheduling her for 2-D echo Doppler study for further evaluation of both systolic and diastolic function.  With her exertional dyspnea and exertional chest pressure.  I'm also scheduling her for an exercise Myoview study.  I am repeating laboratory with a comprehensive metabolic panel, fasting lipid panel, and TSH.  Adjustments to her medical regimen will be made. I will see her in follow-up of the above studies in several weeks for further evaluation.  She plans to being [place] until at least early July.   Medication Adjustments/Labs and Tests Ordered: Current medicines are reviewed at length with the patient today.  Concerns regarding medicines are outlined above.  Medication changes, Labs and Tests ordered today are listed in the Patient Instructions below.  Patient Instructions  Your physician recommends that you return for lab work FASTING. You will come here to our office. You will not need appointment to have blood drawn. DO NOT EAT AFTER MIDNIGHT.  Your physician has requested that you have an echocardiogram. Echocardiography is a painless test that uses sound waves to create images of your heart. It provides your doctor with information about the size and shape of your heart and how well your heart's chambers and valves are working. This procedure takes approximately one hour. There are no restrictions for this procedure.  This will be done at  the [facility] OFFICE. Located at [address]  Your physician has requested that you have a lexiscan myoview. For further information please visit [email or web address] Please follow instruction sheet, as given. This test will be done at this office,   Your physician recommends that you schedule a follow-up appointment before you leave in July.        Signed, Nicki Guadalajara, MD  08/14/2016 6:34 PM    [facility] [address], [place], [place]  [address] Phone: [phone]

## 2016-08-13 NOTE — Patient Instructions (Signed)
Your physician recommends that you return for lab work FASTING. You will come here to our office. You will not need appointment to have blood drawn. DO NOT EAT AFTER MIDNIGHT.  Your physician has requested that you have an echocardiogram. Echocardiography is a painless test that uses sound waves to create images of your heart. It provides your doctor with information about the size and shape of your heart and how well your heart's chambers and valves are working. This procedure takes approximately one hour. There are no restrictions for this procedure.  This will be done at the [facility]. Located at [address].  Your physician has requested that you have a lexiscan myoview. For further information please visit [email or web address] Please follow instruction sheet, as given. This test will be done at this office,   Your physician recommends that you schedule a follow-up appointment before you leave in July.

## 2016-08-15 ENCOUNTER — Telehealth (HOSPITAL_COMMUNITY): Payer: Self-pay

## 2016-08-15 NOTE — Telephone Encounter (Signed)
Encounter complete. 

## 2016-08-16 ENCOUNTER — Telehealth (HOSPITAL_COMMUNITY): Payer: Self-pay

## 2016-08-16 NOTE — Telephone Encounter (Signed)
Encounter complete. 

## 2016-08-17 ENCOUNTER — Ambulatory Visit (HOSPITAL_COMMUNITY)
Admission: RE | Admit: 2016-08-17 | Discharge: 2016-08-17 | Disposition: A | Discharge status: Home / Self Care | Payer: Medicare Other | Source: Ambulatory Visit | Attending: Cardiovascular Disease | Admitting: Cardiovascular Disease

## 2016-08-17 DIAGNOSIS — I10 Essential (primary) hypertension: Secondary | ICD-10-CM | POA: Diagnosis not present

## 2016-08-17 DIAGNOSIS — R0789 Other chest pain: Secondary | ICD-10-CM | POA: Insufficient documentation

## 2016-08-17 DIAGNOSIS — R0609 Other forms of dyspnea: Secondary | ICD-10-CM | POA: Insufficient documentation

## 2016-08-17 DIAGNOSIS — R0602 Shortness of breath: Secondary | ICD-10-CM

## 2016-08-17 DIAGNOSIS — I2510 Atherosclerotic heart disease of native coronary artery without angina pectoris: Secondary | ICD-10-CM | POA: Diagnosis present

## 2016-08-17 LAB — MYOCARDIAL PERFUSION IMAGING
CHL CUP NUCLEAR SSS: 2
CHL CUP STRESS STAGE 1 SPEED: 0 mph
CHL CUP STRESS STAGE 3 GRADE: 0 %
CHL CUP STRESS STAGE 3 HR: 90 {beats}/min
CHL CUP STRESS STAGE 4 DBP: 62 mmHg
CHL CUP STRESS STAGE 4 GRADE: 0 %
CHL CUP STRESS STAGE 4 SBP: 143 mmHg
CSEPPBP: 150 mmHg
CSEPPHR: 90 {beats}/min
Estimated workload: 1 METS
LV dias vol: 19 mL (ref 46–106)
LVSYSVOL: 61 mL
Percent of predicted max HR: 66 %
Rest HR: 67 {beats}/min
SDS: 0
SRS: 2
Stage 1 DBP: 75 mmHg
Stage 1 Grade: 0 %
Stage 1 HR: 65 {beats}/min
Stage 1 SBP: 171 mmHg
Stage 2 Grade: 0 %
Stage 2 HR: 65 {beats}/min
Stage 2 Speed: 0 mph
Stage 3 DBP: 62 mmHg
Stage 3 SBP: 150 mmHg
Stage 3 Speed: 0 mph
Stage 4 HR: 81 {beats}/min
Stage 4 Speed: 0 mph
TID: 1.03

## 2016-08-17 MED ORDER — REGADENOSON 0.4 MG/5ML IV SOLN
0.4000 mg | Freq: Once | INTRAVENOUS | Status: AC
  Administered 2016-08-17: 0.4 mg via INTRAVENOUS

## 2016-08-17 MED ORDER — TECHNETIUM TC 99M TETROFOSMIN IV KIT
11.0000 | PACK | Freq: Once | INTRAVENOUS | Status: AC | PRN
  Administered 2016-08-17: 11 via INTRAVENOUS
  Filled 2016-08-17: qty 11

## 2016-08-17 MED ORDER — TECHNETIUM TC 99M TETROFOSMIN IV KIT
32.1000 | PACK | Freq: Once | INTRAVENOUS | Status: AC | PRN
  Administered 2016-08-17: 32.1 via INTRAVENOUS
  Filled 2016-08-17: qty 33

## 2016-08-29 ENCOUNTER — Ambulatory Visit (HOSPITAL_COMMUNITY): Payer: Medicare Other | Attending: Cardiology

## 2016-08-29 ENCOUNTER — Other Ambulatory Visit: Payer: Self-pay

## 2016-08-29 DIAGNOSIS — I371 Nonrheumatic pulmonary valve insufficiency: Secondary | ICD-10-CM | POA: Insufficient documentation

## 2016-08-29 DIAGNOSIS — R0789 Other chest pain: Secondary | ICD-10-CM | POA: Diagnosis not present

## 2016-08-29 DIAGNOSIS — E785 Hyperlipidemia, unspecified: Secondary | ICD-10-CM | POA: Insufficient documentation

## 2016-08-29 DIAGNOSIS — I10 Essential (primary) hypertension: Secondary | ICD-10-CM | POA: Insufficient documentation

## 2016-08-29 DIAGNOSIS — R0602 Shortness of breath: Secondary | ICD-10-CM | POA: Diagnosis not present

## 2016-08-29 DIAGNOSIS — I340 Nonrheumatic mitral (valve) insufficiency: Secondary | ICD-10-CM | POA: Insufficient documentation

## 2016-09-19 ENCOUNTER — Ambulatory Visit: Payer: Medicare Other | Admitting: Cardiovascular Disease

## 2016-09-23 NOTE — Progress Notes (Signed)
Cardiology Office Note    Date:  09/24/2016   ID:  Traci Hernandez, DOB 10-03-1929, MRN [id number]  PCP:  System, Pcp Not In  Cardiologist: Dr. Tresa EndoKelly  Chief Complaint  Patient presents with  . Follow-up    review testing    History of Present Illness:    Traci Hernandez is a 83 y.o. female with past medical history of HTN and HLD who presents to the office today for follow-up of her recent cardiac testing.   She is from [place] but was evaluated in clinic by Dr. Tresa EndoKelly on 08/13/2016 after being evaluated in the ED for substernal chest pain. At the time of her visit, she reported having episodes of exertional dyspnea along with chest pressure. Labs were ordered at that time but never obtained. NST showed no evidence of ischemia or prior infarction with a preserved EF of 70%, overall being a low-risk study. An echocardiogram was obtained and showed a preserved EF of 55-60% with moderate calcification of the aortic valve with no stenosis or regurgitation. Mild MR was present.   In talking with the patient today, she reports overall doing well since her last office visit. She reports having received a steroid injection for her left knee and at the time of this, her chest discomfort resolved. Feels like her HR might have been elevated due to the knee pain she was experiencing. She denies any repeat episodes of chest pain, dyspnea on exertion, orthopnea, PND, or lower extremity edema. No palpitations, lightheadedness, dizziness, or presyncope.  She has not checked her blood pressure regularly but reports her SBP is usually in the 120's to 130's when checked at home. She feels as if her BP has been elevated lately due to her son being in the hospital and the associated stress of this. She is planning to return to [place] this coming Thursday.   Past Medical History:  Diagnosis Date  . High cholesterol   . Hypertension     No past surgical history on file.  Current Medications: Outpatient Medications Prior  to Visit  Medication Sig Dispense Refill  . vitamin C (ASCORBIC ACID) 500 MG tablet Take 1 tablet by mouth daily.    Marland Kitchen. atorvastatin (LIPITOR) 20 MG tablet Take 1 tablet by mouth every morning.    Marland Kitchen. lisinopril (PRINIVIL,ZESTRIL) 20 MG tablet Take 1 tablet by mouth daily.     No facility-administered medications prior to visit.      Allergies:   Bee venom   Social History   Social History  . Marital status: Single    Spouse name: N/A  . Number of children: N/A  . Years of education: N/A   Social History Main Topics  . Smoking status: Never Smoker  . Smokeless tobacco: Never Used  . Alcohol use No  . Drug use: No  . Sexual activity: Not Asked   Other Topics Concern  . None   Social History Narrative  . None     Family History:  The patient's family history is not on file.   Review of Systems:   Please see the history of present illness.     General:  No chills, fever, night sweats or weight changes.  Cardiovascular:  No dyspnea on exertion, edema, orthopnea, palpitations, paroxysmal nocturnal dyspnea. Positive for chest pain (now resolved).  Dermatological: No rash, lesions/masses Respiratory: No cough, dyspnea Urologic: No hematuria, dysuria Abdominal:   No nausea, vomiting, diarrhea, bright red blood per rectum, melena, or hematemesis Neurologic:  No visual  changes, wkns, changes in mental status.  All other systems reviewed and are otherwise negative except as noted above.   Physical Exam:    VS:  BP (!) 144/66   Pulse 75   Ht 5\' 2"  (1.575 m)   Wt 119 lb 12.8 oz (54.3 kg)   BMI 21.91 kg/m    General: Well developed, elderly African American female appearing in no acute distress. Head: Normocephalic, atraumatic, sclera non-icteric, no xanthomas, nares are without discharge.  Neck: No carotid bruits. JVD not elevated.  Lungs: Respirations regular and unlabored, without wheezes or rales.  Heart: Regular rate and rhythm. No S3 or S4.  No murmur, no rubs, or  gallops appreciated. Abdomen: Soft, non-tender, non-distended with normoactive bowel sounds. No hepatomegaly. No rebound/guarding. No obvious abdominal masses. Msk:  Strength and tone appear normal for age. No joint deformities or effusions. Extremities: No clubbing or cyanosis. No lower extremity edema.  Distal pedal pulses are 2+ bilaterally. Neuro: Alert and oriented X 3. Moves all extremities spontaneously. No focal deficits noted. Psych:  Responds to questions appropriately with a normal affect. Skin: No rashes or lesions noted  Wt Readings from Last 3 Encounters:  09/24/16 119 lb 12.8 oz (54.3 kg)  08/17/16 119 lb (54 kg)  08/13/16 119 lb 6.4 oz (54.2 kg)     Studies/Labs Reviewed:   EKG:  EKG is not ordered today.   Recent Labs: 08/08/2016: BUN 18; Creatinine, Ser 1.09; Hemoglobin 12.4; Platelets 135; Potassium 4.1; Sodium 141   Lipid Panel No results found for: CHOL, TRIG, HDL, CHOLHDL, VLDL, LDLCALC, LDLDIRECT  Additional studies/ records that were reviewed today include:   Echocardiogram: 08/29/2016 Study Conclusions  - Left ventricle: The cavity size was normal. Systolic function was   normal. The estimated ejection fraction was in the range of 55%   to 60%. Wall motion was normal; there were no regional wall   motion abnormalities. Left ventricular diastolic function   parameters were normal. - Aortic valve: Moderate focal thickening and calcification   involving the noncoronary cusp. Valve area (VTI): 2.11 cm^2.   Valve area (Vmax): 2.3 cm^2. Valve area (Vmean): 2.15 cm^2. - Mitral valve: Mild calcification. There was mild regurgitation.  NST: 08/17/2016  The left ventricular ejection fraction is hyperdynamic (>65%).  Nuclear stress EF: 70%.  There was no ST segment deviation noted during stress.  The study is normal.  This is a low risk study.   Normal stress nuclear study with no ischemia or infarction; EF 70 with normal wall motion.    Assessment:     1. Atypical chest pain   2. Essential hypertension   3. Hyperlipidemia, unspecified hyperlipidemia type      Plan:   In order of problems listed above:  1. Atypical Chest Pain - previously evaluated by Dr. Tresa Endo for substernal chest pain. NST was performed and showed no evidence of ischemia or prior infarction with a preserved EF of 70%, overall being a low-risk study. An echocardiogram was obtained and showed a preserved EF of 55-60% with moderate calcification of the aortic valve with no stenosis or regurgitation. Mild MR was present.  - she denies any repeat episodes of chest pain or dyspnea on exertion since receiving a steroid injection in her knee.  - I am in agreement with the patient that her chest pain was likely exacerbated by her elevated HR/BP and anxiety when she was experiencing episodes of knee pain.  - no further testing is indicated at this time. I  printed copies of her echo and NST so she could share these with her PCP upon returning to [place] later this week.  2. HTN - BP initially elevated to 164/73, at 144/66 on recheck. - continue Lisinopril 20mg  daily. She believes her current situation stress is influencing her BP. I recommended she check her BP closely upon returning to [place] and follow-up with her PCP if SBP remains > 130.   3. HLD - remains on Atorvastatin 20mg  daily.  - followed by PCP.   Medication Adjustments/Labs and Tests Ordered: Current medicines are reviewed at length with the patient today.  Concerns regarding medicines are outlined above.  Medication changes, Labs and Tests ordered today are listed in the Patient Instructions below. Patient Instructions  Medication Instructions:  Your physician recommends that you continue on your current medications as directed. Please refer to the Current Medication list given to you today.  Any Other Special Instructions Will Be Listed Below (If Applicable).  Daisey Must with your move!  If you need a refill  on your cardiac medications before your next appointment, please call your pharmacy.    Signed, Ellsworth Lennox, PA-C  09/24/2016 7:27 PM    [facility] [address] [place], [address] [place], [place]  [address] Phone: [phone]; Fax: [phone]  [address], [address] [place], [place] [address] Phone: [phone]

## 2016-09-24 ENCOUNTER — Encounter: Payer: Self-pay | Admitting: Student

## 2016-09-24 ENCOUNTER — Ambulatory Visit (INDEPENDENT_AMBULATORY_CARE_PROVIDER_SITE_OTHER): Payer: Medicare Other | Admitting: Student

## 2016-09-24 VITALS — BP 144/66 | HR 75 | Ht 62.0 in | Wt 119.8 lb

## 2016-09-24 DIAGNOSIS — R0789 Other chest pain: Secondary | ICD-10-CM | POA: Diagnosis not present

## 2016-09-24 DIAGNOSIS — I10 Essential (primary) hypertension: Secondary | ICD-10-CM

## 2016-09-24 DIAGNOSIS — E785 Hyperlipidemia, unspecified: Secondary | ICD-10-CM

## 2016-09-24 MED ORDER — ATORVASTATIN CALCIUM 20 MG PO TABS: 20.0000 mg | ORAL_TABLET | ORAL | 0 refills | Status: AC

## 2016-09-24 MED ORDER — LISINOPRIL 20 MG PO TABS: 20.0000 mg | ORAL_TABLET | Freq: Every day | ORAL | 0 refills | Status: AC

## 2016-09-24 NOTE — Patient Instructions (Signed)
Medication Instructions:  Your physician recommends that you continue on your current medications as directed. Please refer to the Current Medication list given to you today.   Any Other Special Instructions Will Be Listed Below (If Applicable).  Traci MustGood Luck with your move!   If you need a refill on your cardiac medications before your next appointment, please call your pharmacy.

## 2016-11-03 ENCOUNTER — Inpatient Hospital Stay: Admit: 2016-11-03 | Discharge: 2016-11-03 | Disposition: A | Discharge status: Home / Self Care

## 2016-11-03 DIAGNOSIS — T63441A Toxic effect of venom of bees, accidental (unintentional), initial encounter: Secondary | ICD-10-CM

## 2016-11-03 MED ORDER — DIPHENHYDRAMINE HCL 25 MG PO CAPS
25 | ORAL_CAPSULE | ORAL | 0 refills | Status: AC | PRN
Start: 2016-11-03 — End: 2016-11-13

## 2016-11-03 NOTE — ED Provider Notes (Signed)
**[facility]  eMERGENCY dEPARTMENT eNCOUnter      Pt Name: Amber Palmer  MRN: [id number]  Paul July 14, 1929  Date of evaluation: 11/03/2016  Provider: Vickki Hearing, PA      Chief Complaint:    Chief Complaint   Patient presents with   . Insect Bite     Patient states she was stung by a bee yesterday in left leg.        Nursing Notes, Past Medical Hx, Past Surgical Hx, Social Hx, Allergies, and Family Hx were all reviewed and agreed with or any disagreements were addressed in the HPI.    HPI:  (Location, Duration, Timing, Severity, Quality, Assoc Sx, Context, Modifying factors)  This is a  83 y.o. female Who presents to the emergency department, she says that she was stung by a bee yesterday in the left leg, and gradually has noticed over the last 24 hours swelling to her lower extremity. There is a very mild redness, she admits to itching, and minimal to moderate pain. Nothing seems to make her feel better. Swelling gets better if she elevates her leg. She has taken over the counter antihistamines. She was concerned that if she was allergic that she would stop breathing.    Past Medical/Surgical History:      Diagnosis Date   . Hyperlipidemia    . Hypertension    . Vitamin D deficiency      History reviewed. No pertinent surgical history.    Medications:  Discharge Medication List as of 11/03/2016 10:17 AM      CONTINUE these medications which have NOT CHANGED    Details   acetaminophen (APAP EXTRA STRENGTH) 500 MG tablet 1 tablet by mouth with breakfast, lunch, dinner, and before bedtime every day for the next 5 days, Disp-20 tablet, R-0Print      Calcium Ascorbate 500 MG TABS Take 500 mg by mouthHistorical Med      vitamin B-1 (THIAMINE) 100 MG tablet Take 100 mg by mouth dailyHistorical Med      vitamin E 1000 units capsule Take 1,000 Units by mouth dailyHistorical Med      calcium carbonate (OSCAL) 500 MG TABS tablet Take 500 mg by  mouth dailyHistorical Med      lisinopril (PRINIVIL;ZESTRIL) 20 MG tablet Take 1 tablet by mouth daily, Disp-30 tablet, R-3Normal      atorvastatin (LIPITOR) 20 MG tablet Take 1 tablet by mouth daily, Disp-30 tablet, R-3Normal      vitamin D (CHOLECALCIFEROL) 50000 UNIT CAPS Take 1 capsule by mouth once a week, Disp-4 capsule, R-2Normal               Review of Systems:  Review of Systems  Positives and Pertinent negatives as per HPI.  Except as noted above in the ROS, problem specific ROS was completed and is negative.    Physical Exam:  Physical Exam   Constitutional: She is oriented to person, place, and time. She appears well-developed and well-nourished.   HENT:   Head: Normocephalic and atraumatic.   Right Ear: External ear normal.   Left Ear: External ear normal.   Nose: Nose normal.   Eyes: Right eye exhibits no discharge. Left eye exhibits no discharge.   Neck: Normal range of motion. Neck supple.   Pulmonary/Chest: Effort normal. No stridor. No respiratory distress.   Musculoskeletal: Normal range of motion.   Punctate insect bite noted, with mild  generalized swelling to the foot and calf. No tenderness to the posterior calf to suggest DVT   Neurological: She is alert and oriented to person, place, and time.   Skin: Skin is warm and dry. She is not diaphoretic. No pallor.   Psychiatric: She has a normal mood and affect. Her behavior is normal.   Nursing note and vitals reviewed.      MEDICAL DECISION MAKING    Vitals:    Vitals:    11/03/16 0921   BP: (!) 171/76   Pulse: 77   Resp: 14   Temp: 98.1 F (36.7 C)   SpO2: 99%   Weight: 117 lb (53.1 kg)       LABS: Labs Reviewed - No data to display     Remainder of labs reviewed and were negative at this time or not returned at the time of this note.    RADIOLOGY:   Non-plain film images such as CT, Ultrasound and MRI are read by the radiologist. Orville Govern, PA have directly visualized the radiologic plain film image(s) with the below  findings:        Interpretation per the Radiologist below, if available at the time of this note:    No orders to display        No results found.     MEDICAL DECISION MAKING / ED COURSE:      PROCEDURES:   Procedures    None    Patient was given:  Medications - No data to display    There is no evidence of anaphylaxis, it's been 24 hours since he was stone, I've encouraged her to use over-the-counter Benadryl and follow-up with her family physician.    The patient tolerated their visit well.  I saw the patient independently with physician available for consultation as needed.  The patient and / or the family were informed of the results of any tests, a time was given to answer questions, a plan was proposed and they agreed with plan.      CLINICAL IMPRESSION:  1. Bee sting, accidental or unintentional, initial encounter        DISPOSITION Decision To Discharge 11/03/2016 10:13:44 AM      PATIENT REFERRED TO:  Lillia Carmel  [address]  [place] [address]  [phone]    Call   For a recheck in 2-3 days      DISCHARGE MEDICATIONS:  Discharge Medication List as of 11/03/2016 10:17 AM      START taking these medications    Details   diphenhydrAMINE (BENADRYL) 25 MG capsule Take 1 capsule by mouth every 4 hours as needed for Itching, Disp-25 capsule, R-0Print             DISCONTINUED MEDICATIONS:  Discharge Medication List as of 11/03/2016 10:17 AM                 (Please note the MDM and HPI sections of this note were completed with a voice recognition program.  Efforts were made to edit the dictations but occasionally words are mis-transcribed.)    Electronically signed, Vickki Hearing, [place],          Vickki Hearing, [place]  11/08/16 Vernelle Emerald

## 2016-11-03 NOTE — Discharge Instructions (Signed)
ELEVATION  ELEVATE LOWER LEG BY PUTTING DRESSER DRAWER BETWEEN MATTRESS AND BOX SPRINGAT THE FOOT OF THE BED.  SLEEP THAT WAY FOR 2-3 WEEKS.  IT MAY TAKE A FEW DAYS TO GET USED TO SLEEPING THAT WAY.

## 2016-11-03 NOTE — Unmapped (Signed)
Your patient was seen at a [facility]. Please go to [email or web address] to view information filed to your patient's chart in Epic.  If you need to view your patient's results prior to gaining access to   Epic CareLink, please contact the [facility] where your patient was seen.              Stephanie Suarez, Stephanie Suarez #[id number] ([phone])  (83 y.o. F)   PCP: Tobie Poet [phone])    [address]         ED Arrival Information     Expected Arrival Acuity Means of Arrival Escorted By Service Admission Type    - 11/03/2016  9:07 AM 4-Less Urgent Personal Transport - Emergency Medicine Emergency      Arrival Complaint    BEE [facility]        Chief Complaint     Complaint Comment    Insect Bite Patient states she was stung by a bee yesterday in left leg.         ED Vitals    Date/Time Temp Pulse Resp BP SpO2 Weight Who   11/03/16 0921 98.1 ????F (36.7 ????C) 77 14 (!)  171/76 99 % 117 lb (53.1 kg) ADA        Allergies (verified on: 11/03/16)     Agent Severity Comments    Bee Venom          Medical History     Past Medical History      Date Comments   Hypertension [I10]     Hyperlipidemia [E78.5]     Vitamin D deficiency [E55.9]            Surgical History       Obstetric History    The patient has not been asked about pregnancy.     ED Provider Notes     Berlin Hun, [place] 11/08/2016  6:22 PM              **[facility]  eMERGENCY dEPARTMENT eNCOUnter      Pt Name: Stephanie Suarez  MRN: [id number]  Birthdate 06-21-1929  Date of evaluation: 11/03/2016  Provider: Berlin Hun, PA      Chief Complaint:    Chief Complaint     Patient presents with     ??????? Insect Bite      Patient states she was stung by a bee yesterday in left leg.        Nursing Notes, Past Medical Hx, Past Surgical Hx, Social Hx, Allergies, and Family Hx were all reviewed and agreed with or any disagreements were addressed in the HPI.    HPI:  (Location,  Duration, Timing, Severity, Quality, Assoc Sx, Context, Modifying factors)  This is a  83 y.o. female Who presents to the emergency department, she says that she was stung by a bee yesterday in the left leg, and gradually has noticed over the last 24 hours swelling to her lower extremity. There is a very mild redness, she admits   to itching, and minimal to moderate pain. Nothing seems to make her feel better. Swelling gets better if she elevates her leg. She has taken over the counter antihistamines. She was concerned that if she was allergic that she would stop breathing.    Past Medical/Surgical History:        Diagnosis  Date   ??????? Hyperlipidemia    ??????? Hypertension    ???????  Vitamin D deficiency      History reviewed. No pertinent surgical history.    Medications:  Discharge Medication List as of 11/03/2016 10:17 AM      CONTINUE these medications which have NOT CHANGED     Details   acetaminophen (APAP EXTRA STRENGTH) 500 MG tablet 1 tablet by mouth with breakfast, lunch, dinner, and before bedtime every day for the next 5 days, Disp-20 tablet, R-0Print       Calcium Ascorbate 500 MG TABS Take 500 mg by mouthHistorical Med       vitamin B-1 (THIAMINE) 100 MG tablet Take 100 mg by mouth dailyHistorical Med       vitamin E 1000 units capsule Take 1,000 Units by mouth dailyHistorical Med       calcium carbonate (OSCAL) 500 MG TABS tablet Take 500 mg by mouth dailyHistorical Med       lisinopril (PRINIVIL;ZESTRIL) 20 MG tablet Take 1 tablet by mouth daily, Disp-30 tablet, R-3Normal       atorvastatin (LIPITOR) 20 MG tablet Take 1 tablet by mouth daily, Disp-30 tablet, R-3Normal       vitamin D (CHOLECALCIFEROL) 50000 UNIT CAPS Take 1 capsule by mouth once a week, Disp-4 capsule, R-2Normal                 Review of Systems:  Review of Systems  Positives and Pertinent negatives as per HPI.  Except as noted above in the ROS, problem specific ROS was completed and is negative.    Physical Exam:  Physical Exam    Constitutional: She is oriented to person, place, and time. She appears well-developed and well-nourished.   HENT:   Head: Normocephalic and atraumatic.   Right Ear: External ear normal.   Left Ear: External ear normal.   Nose: Nose normal.   Eyes: Right eye exhibits no discharge. Left eye exhibits no discharge.   Neck: Normal range of motion. Neck supple.   Pulmonary/Chest: Effort normal. No stridor. No respiratory distress.   Musculoskeletal: Normal range of motion.   Punctate insect bite noted, with mild generalized swelling to the foot and calf. No tenderness to the posterior calf to suggest DVT   Neurological: She is alert and oriented to person, place, and time.   Skin: Skin is warm and dry. She is not diaphoretic. No pallor.   Psychiatric: She has a normal mood and affect. Her behavior is normal.   Nursing note and vitals reviewed.      MEDICAL DECISION MAKING    Vitals:    Vitals:     11/03/16 0921   BP: (!) 171/76   Pulse: 77   Resp: 14   Temp: 98.1 ????F (36.7 ????C)   SpO2: 99%   Weight: 117 lb (53.1 kg)       LABS: Labs Reviewed - No data to display     Remainder of labs reviewed and were negative at this time or not returned at the time of this note.    RADIOLOGY:   Non-plain film images such as CT, Ultrasound and MRI are read by the radiologist. Rikki Spearing, PA have directly visualized the radiologic plain film image(s) with the below findings:        Interpretation per the Radiologist below, if available at the time of this note:    No orders to display        No results found.     MEDICAL DECISION MAKING / ED COURSE:  PROCEDURES:   Procedures    None    Patient was given:  Medications - No data to display    There is no evidence of anaphylaxis, it's been 24 hours since he was stone, I've encouraged her to use over-the-counter Benadryl and follow-up with her family physician.    The patient tolerated their visit well.  I saw the patient independently with physician available for consultation  as needed.  The patient and / or the family were informed of the results of any tests, a time was given to answer questions, a plan was   proposed and they agreed with plan.      CLINICAL IMPRESSION:  1. Bee sting, accidental or unintentional, initial encounter        DISPOSITION Decision To Discharge 11/03/2016 10:13:44 AM      PATIENT REFERRED TO:  Ranelle Oyster  [address]  [place] [address]  [phone]    Call   For a recheck in 2-3 days      DISCHARGE MEDICATIONS:  Discharge Medication List as of 11/03/2016 10:17 AM      START taking these medications     Details   diphenhydrAMINE (BENADRYL) 25 MG capsule Take 1 capsule by mouth every 4 hours as needed for Itching, Disp-25 capsule, R-0Print               DISCONTINUED MEDICATIONS:  Discharge Medication List as of 11/03/2016 10:17 AM                 (Please note the MDM and HPI sections of this note were completed with a voice recognition program.  Efforts were made to edit the dictations but occasionally words are mis-transcribed.)    Electronically signed, Berlin Hun, PA,          Berlin Hun, [place]  11/08/16 1822          ED Medication Orders     None      Lab Results    None     Imaging Results    None     ED Administered Medications from 11/03/2016 0907 to 11/08/2016 1822     None      ED Treatment Team     Provider Role From To Phone Pager    Berlin Hun, [place] Physician Assistant 11/03/16 [phone] -- [phone]         Code Onset/Outcome    None     Code Interventions/Drips/Airways    None     Diagnosis     Diagnosis Comment    Bee sting, accidental or unintentional, initial encounter         ED Prescriptions     Medication Sig Dispense Start Date End Date Auth. Provider    diphenhydrAMINE (BENADRYL) 25 MG capsule Take 1 capsule by mouth every 4 hours as needed for Itching 25 capsule 11/03/2016 11/13/2016 Berlin Hun, PA        Follow-up Information     Follow up With Specialties Details Why Contact Info    Kadiatou Oplinger [facility]qul[facility]  For a recheck in 2-3 days [address] [place] [address] [phone]           Discharge Instructions       ELEVATION  ELEVATE LOWER LEG BY PUTTING DRESSER DRAWER BETWEEN MATTRESS AND BOX SPRINGAT THE FOOT OF THE BED.  SLEEP THAT WAY FOR 2-3 WEEKS.  IT MAY TAKE A FEW DAYS TO GET USED TO SLEEPING  THAT WAY.

## 2016-12-14 ENCOUNTER — Ambulatory Visit (INDEPENDENT_AMBULATORY_CARE_PROVIDER_SITE_OTHER): Payer: Medicare Other | Admitting: Orthopaedic Surgery

## 2016-12-14 ENCOUNTER — Encounter (INDEPENDENT_AMBULATORY_CARE_PROVIDER_SITE_OTHER): Payer: Self-pay | Admitting: Orthopaedic Surgery

## 2016-12-14 DIAGNOSIS — M1712 Unilateral primary osteoarthritis, left knee: Secondary | ICD-10-CM

## 2016-12-14 MED ORDER — BUPIVACAINE HCL 0.5 % IJ SOLN
2.0000 mL | INTRAMUSCULAR | Status: AC | PRN
  Administered 2016-12-14: 2 mL via INTRA_ARTICULAR

## 2016-12-14 MED ORDER — METHYLPREDNISOLONE ACETATE 40 MG/ML IJ SUSP
40.0000 mg | INTRAMUSCULAR | Status: AC | PRN
  Administered 2016-12-14: 40 mg via INTRA_ARTICULAR

## 2016-12-14 MED ORDER — LIDOCAINE HCL 1 % IJ SOLN
2.0000 mL | INTRAMUSCULAR | Status: AC | PRN
  Administered 2016-12-14: 2 mL

## 2016-12-14 NOTE — Progress Notes (Signed)
   Office Visit Note   Patient: Traci Hernandez           Date of Birth: Feb 07, 1930           MRN: [id number] Visit Date: 12/14/2016              Requested by: No referring provider defined for this encounter. PCP: System, Pcp Not In   Assessment & Plan: Visit Diagnoses:  1. Primary osteoarthritis of left knee     Plan: Left knee cortisone injection was performed today. We submitted preapproval for viscous supplementation injections. Follow-up as needed.  Follow-Up Instructions: Return if symptoms worsen or fail to improve.   Orders:  No orders of the defined types were placed in this encounter.  No orders of the defined types were placed in this encounter.     Procedures: Large Joint Inj Date/Time: 12/14/2016 9:45 AM Performed by: Tarry Kos Authorized by: Tarry Kos   Consent Given by:  Patient Timeout: prior to procedure the correct patient, procedure, and site was verified   Indications:  Pain Location:  Knee Site:  L knee Prep: patient was prepped and draped in usual sterile fashion   Needle Size:  22 G Ultrasound Guidance: No   Fluoroscopic Guidance: No   Arthrogram: No   Medications:  2 mL lidocaine 1 %; 2 mL bupivacaine 0.5 %; 40 mg methylPREDNISolone acetate 40 MG/ML Patient tolerance:  Patient tolerated the procedure well with no immediate complications     Clinical Data: No additional findings.   Subjective: Chief Complaint  Patient presents with  . Left Knee - Pain, Follow-up    Patient follows up today for her left knee arthritis. She had about 4 months of relief from the previous cortisone injection. She would like another one. She is also interested in physical supplementation shots.    Review of Systems   Objective: Vital Signs: There were no vitals taken for this visit.  Physical Exam  Ortho Exam Left knee exam is stable. No effusion. Specialty Comments:  No specialty comments available.  Imaging: No results found.   PMFS  History: Patient Active Problem List   Diagnosis Date Noted  . Primary osteoarthritis of left knee 08/09/2016   Past Medical History:  Diagnosis Date  . High cholesterol   . Hypertension     No family history on file.  No past surgical history on file. Social History   Occupational History  . Not on file.   Social History Main Topics  . Smoking status: Never Smoker  . Smokeless tobacco: Never Used  . Alcohol use No  . Drug use: No  . Sexual activity: Not on file

## 2017-01-18 NOTE — Unmapped (Signed)
Scrubbing Chart regarding attribution list. Patient is no longer with [facility].      Clance Baquero  Care Coordinator  [phone]

## 2017-02-19 ENCOUNTER — Ambulatory Visit (INDEPENDENT_AMBULATORY_CARE_PROVIDER_SITE_OTHER): Payer: Medicare Other | Admitting: Orthopaedic Surgery

## 2017-02-19 DIAGNOSIS — M1712 Unilateral primary osteoarthritis, left knee: Secondary | ICD-10-CM

## 2017-02-19 MED ORDER — HYALURONAN 88 MG/4ML IX SOSY
88.0000 mg | PREFILLED_SYRINGE | INTRA_ARTICULAR | Status: AC | PRN
  Administered 2017-02-19: 88 mg via INTRA_ARTICULAR

## 2017-02-19 NOTE — Progress Notes (Signed)
   Procedure Note  Patient: Traci Hernandez             Date of Birth: 1930/02/07           MRN: [id number]             Visit Date: 02/19/2017  Procedures: Visit Diagnoses: Primary osteoarthritis of left knee  Large Joint Inj: L knee on 02/19/2017 8:42 AM Indications: pain Details: 22 G needle  Arthrogram: No  Medications: 88 mg Hyaluronan 88 MG/4ML Outcome: tolerated well, no immediate complications Patient was prepped and draped in the usual sterile fashion.

## 2017-07-11 ENCOUNTER — Inpatient Hospital Stay
Admit: 2017-07-11 | Discharge: 2017-07-11 | Disposition: A | Discharge status: Home / Self Care | Payer: MEDICARE | Attending: Family Medicine

## 2017-07-11 ENCOUNTER — Emergency Department: Admit: 2017-07-11 | Payer: MEDICARE | Primary: Nurse Practitioner

## 2017-07-11 DIAGNOSIS — N200 Calculus of kidney: Secondary | ICD-10-CM

## 2017-07-11 LAB — CBC WITH AUTO DIFFERENTIAL
Basophils %: 0.4 %
Basophils Absolute: 0 10*3/uL (ref 0.0–0.2)
Eosinophils %: 0.9 %
Eosinophils Absolute: 0.1 10*3/uL (ref 0.0–0.6)
Hematocrit: 40.4 % (ref 36.0–48.0)
Hemoglobin: 13.2 g/dL (ref 12.0–16.0)
Lymphocytes %: 17.7 %
Lymphocytes Absolute: 1.3 10*3/uL (ref 1.0–5.1)
MCH: 33.7 pg (ref 26.0–34.0)
MCHC: 32.7 g/dL (ref 31.0–36.0)
MCV: 102.9 fL — ABNORMAL HIGH (ref 80.0–100.0)
MPV: 9.6 fL (ref 5.0–10.5)
Monocytes %: 6.4 %
Monocytes Absolute: 0.5 10*3/uL (ref 0.0–1.3)
Neutrophils %: 74.6 %
Neutrophils Absolute: 5.4 10*3/uL (ref 1.7–7.7)
Platelets: 124 10*3/uL — ABNORMAL LOW (ref 135–450)
RBC: 3.93 M/uL — ABNORMAL LOW (ref 4.00–5.20)
RDW: 13.2 % (ref 12.4–15.4)
WBC: 7.3 10*3/uL (ref 4.0–11.0)

## 2017-07-11 LAB — URINALYSIS WITH REFLEX TO CULTURE
Bilirubin Urine: NEGATIVE
Glucose, Ur: NEGATIVE mg/dL
Ketones, Urine: NEGATIVE mg/dL
Nitrite, Urine: NEGATIVE
Protein, UA: 30 mg/dL — AB
Specific Gravity, UA: 1.016 (ref 1.005–1.030)
Urobilinogen, Urine: 1 E.U./dL (ref <2.0)
pH, UA: 6.5 (ref 5.0–8.0)

## 2017-07-11 LAB — COMPREHENSIVE METABOLIC PANEL
ALT: 24 U/L (ref 10–40)
AST: 25 U/L (ref 15–37)
Albumin/Globulin Ratio: 1.2 (ref 1.1–2.2)
Albumin: 4.1 g/dL (ref 3.4–5.0)
Alkaline Phosphatase: 61 U/L (ref 40–129)
Anion Gap: 10 (ref 3–16)
BUN: 17 mg/dL (ref 7–20)
CO2: 26 mmol/L (ref 21–32)
Calcium: 9.4 mg/dL (ref 8.3–10.6)
Chloride: 105 mmol/L (ref 99–110)
Creatinine: 0.9 mg/dL (ref 0.6–1.2)
GFR African American: >60 (ref >60)
GFR Non-African American: 59 — AB (ref >60)
Globulin: 3.4 g/dL
Glucose: 137 mg/dL — ABNORMAL HIGH (ref 70–99)
Potassium: 4.3 mmol/L (ref 3.5–5.1)
Sodium: 141 mmol/L (ref 136–145)
Total Bilirubin: 0.6 mg/dL (ref 0.0–1.0)
Total Protein: 7.5 g/dL (ref 6.4–8.2)

## 2017-07-11 LAB — MICROSCOPIC URINALYSIS
Epithelial Cells, UA: 1 /HPF (ref 0–5)
Hyaline Casts, UA: 2 /LPF (ref 0–8)
RBC, UA: >900 /HPF — ABNORMAL HIGH (ref 0–4)
WBC, UA: 4 /HPF (ref 0–5)

## 2017-07-11 LAB — LIPASE: Lipase: 54 U/L (ref 13.0–60.0)

## 2017-07-11 MED ORDER — ACETAMINOPHEN 500 MG PO TABS
500 MG | Freq: Once | ORAL | Status: AC
Start: 2017-07-11 — End: 2017-07-11
  Administered 2017-07-11: 08:00:00 1000 mg via ORAL

## 2017-07-11 MED ORDER — ONDANSETRON HCL 4 MG PO TABS
4 | ORAL_TABLET | Freq: Three times a day (TID) | ORAL | 0 refills | Status: DC | PRN
Start: 2017-07-11 — End: 2017-07-20

## 2017-07-11 MED ORDER — ONDANSETRON HCL 4 MG/2ML IJ SOLN
4 MG/2ML | Freq: Once | INTRAMUSCULAR | Status: AC
Start: 2017-07-11 — End: 2017-07-11
  Administered 2017-07-11: 10:00:00 8 mg via INTRAVENOUS

## 2017-07-11 MED ORDER — HYDROCODONE-ACETAMINOPHEN 5-325 MG PO TABS
5-325 | ORAL_TABLET | Freq: Three times a day (TID) | ORAL | 0 refills | Status: AC | PRN
Start: 2017-07-11 — End: 2017-07-14

## 2017-07-11 MED ORDER — KETOROLAC TROMETHAMINE 30 MG/ML IJ SOLN
30 MG/ML | Freq: Once | INTRAMUSCULAR | Status: AC
Start: 2017-07-11 — End: 2017-07-11
  Administered 2017-07-11: 08:00:00 15 mg via INTRAVENOUS

## 2017-07-11 MED ORDER — NAPROXEN 250 MG PO TABS
250 | ORAL_TABLET | Freq: Two times a day (BID) | ORAL | 0 refills | Status: DC
Start: 2017-07-11 — End: 2021-08-24

## 2017-07-11 MED ORDER — TAMSULOSIN HCL 0.4 MG PO CAPS
0.4 MG | Freq: Once | ORAL | Status: AC
Start: 2017-07-11 — End: 2017-07-11
  Administered 2017-07-11: 10:00:00 0.4 mg via ORAL

## 2017-07-11 MED ORDER — IOPAMIDOL 76 % IV SOLN
76 | Freq: Once | INTRAVENOUS | Status: AC | PRN
Start: 2017-07-11 — End: 2017-07-11
  Administered 2017-07-11: 09:00:00 75 mL via INTRAVENOUS

## 2017-07-11 MED ORDER — SODIUM CHLORIDE 0.9 % IV BOLUS
0.9 | Freq: Once | INTRAVENOUS | Status: AC
Start: 2017-07-11 — End: 2017-07-11
  Administered 2017-07-11: 08:00:00 1000 mL via INTRAVENOUS

## 2017-07-11 MED ORDER — FENTANYL CITRATE (PF) 100 MCG/2ML IJ SOLN
100 | Freq: Once | INTRAMUSCULAR | Status: AC
Start: 2017-07-11 — End: 2017-07-11
  Administered 2017-07-11: 10:00:00 25 ug via INTRAVENOUS

## 2017-07-11 MED ORDER — SODIUM CHLORIDE 0.9 % IV BOLUS
0.9 % | Freq: Once | INTRAVENOUS | Status: DC
Start: 2017-07-11 — End: 2017-07-11

## 2017-07-11 MED FILL — TYLENOL EXTRA STRENGTH 500 MG PO TABS: 500 mg | ORAL | Qty: 2

## 2017-07-11 MED FILL — FENTANYL CITRATE (PF) 100 MCG/2ML IJ SOLN: 100 MCG/2ML | INTRAMUSCULAR | Qty: 2

## 2017-07-11 MED FILL — SODIUM CHLORIDE 0.9 % IV SOLN: 0.9 % | INTRAVENOUS | Qty: 1000

## 2017-07-11 MED FILL — ONDANSETRON HCL 4 MG/2ML IJ SOLN: 4 MG/2ML | INTRAMUSCULAR | Qty: 4

## 2017-07-11 MED FILL — TAMSULOSIN HCL 0.4 MG PO CAPS: 0.4 mg | ORAL | Qty: 1

## 2017-07-11 MED FILL — KETOROLAC TROMETHAMINE 30 MG/ML IJ SOLN: 30 mg/mL | INTRAMUSCULAR | Qty: 1

## 2017-07-11 NOTE — ED Provider Notes (Signed)
Triage Chief Complaint:   Flank Pain (left side flank pain that started 30 minutes ago. +nausea, +urinary frequency)    HOPI:  Amber Palmer is a 84 y.o. female that presents with fairly acute onset of left-sided flank pain that started 30 minutes prior to presentation.  She notes nausea but no vomiting.  No diarrhea constipation.  She does note urinary frequency.  Note hematuria per the patient.  She states that she has had a urinary tract infection before and this feels different.  No prior history of kidney stones.  No fevers or chills.  No rigors.  No weakness.  No hematemesis or melena.    ROS:  General:  No fevers, no chills, no weakness  Eyes:  No recent vison changes, no discharge  ENT:  No sore throat, no nasal congestion, no hearing changes  Cardiovascular:  No chest pain, no palpitations  Respiratory:  No shortness of breath, no cough, no wheezing  Gastrointestinal: As above  Musculoskeletal:  No muscle pain, no joint pain  Skin:  No rash, no pruritis, no easy bruising  Neurologic:  No speech problems, no headache, no extremity numbness, no extremity tingling, no extremity weakness  Psychiatric:  No anxiety, no hallucinations or delusions, no suicidal or homicidal ideation  Genitourinary:  No dysuria, no hematuria  Endocrine:  No unexpected weight gain, no unexpected weight loss  Extremities:  no edema, no pain    Past Medical History:   Diagnosis Date   . Hyperlipidemia    . Hypertension    . Vitamin D deficiency      History reviewed. No pertinent surgical history.  History reviewed. No pertinent family history.  Social History     Socioeconomic History   . Marital status: Divorced     Spouse name: Not on file   . Number of children: Not on file   . Years of education: Not on file   . Highest education level: Not on file   Occupational History   . Not on file   Social Needs   . Financial resource strain: Not on file   . Food insecurity:     Worry: Not on file     Inability: Not on file   . Transportation  needs:     Medical: Not on file     Non-medical: Not on file   Tobacco Use   . Smoking status: Never Smoker   . Smokeless tobacco: Never Used   Substance and Sexual Activity   . Alcohol use: No   . Drug use: Never   . Sexual activity: Not on file   Lifestyle   . Physical activity:     Days per week: Not on file     Minutes per session: Not on file   . Stress: Not on file   Relationships   . Social connections:     Talks on phone: Not on file     Gets together: Not on file     Attends religious service: Not on file     Active member of club or organization: Not on file     Attends meetings of clubs or organizations: Not on file     Relationship status: Not on file   . Intimate partner violence:     Fear of current or ex partner: Not on file     Emotionally abused: Not on file     Physically abused: Not on file     Forced sexual activity: Not on  file   Other Topics Concern   . Not on file   Social History Narrative   . Not on file     No current facility-administered medications for this encounter.      Current Outpatient Medications   Medication Sig Dispense Refill   . naproxen (NAPROSYN) 250 MG tablet Take 1 tablet by mouth 2 times daily (with meals) for 10 days 20 tablet 0   . ondansetron (ZOFRAN) 4 MG tablet Take 1 tablet by mouth 3 times daily as needed for Nausea or Vomiting 30 tablet 0   . HYDROcodone-acetaminophen (NORCO) 5-325 MG per tablet Take 1 tablet by mouth every 8 hours as needed for Pain for up to 3 days. 6 tablet 0   . Calcium Ascorbate 500 MG TABS Take 500 mg by mouth     . vitamin B-1 (THIAMINE) 100 MG tablet Take 100 mg by mouth daily     . vitamin E 1000 units capsule Take 1,000 Units by mouth daily     . calcium carbonate (OSCAL) 500 MG TABS tablet Take 500 mg by mouth daily     . lisinopril (PRINIVIL;ZESTRIL) 20 MG tablet Take 1 tablet by mouth daily 30 tablet 3   . atorvastatin (LIPITOR) 20 MG tablet Take 1 tablet by mouth daily 30 tablet 3   . vitamin D (CHOLECALCIFEROL) 50000 UNIT CAPS Take  1 capsule by mouth once a week 4 capsule 2     Allergies   Allergen Reactions   . Bee Venom        Nursing Notes Reviewed    Physical Exam:  ED Triage Vitals [07/11/17 0310]   Enc Vitals Group      BP (!) 184/72      Pulse 75      Resp 16      Temp 96.9 F (36.1 C)      Temp Source Oral      SpO2 99 %      Weight 120 lb (54.4 kg)      Height 5\' 2"  (1.575 m)      Head Circumference       Peak Flow       Pain Score       Pain Loc       Pain Edu?       Excl. in GC?        My pulse ox interpretation is - normal    General appearance:  No acute distress.   Skin:  Warm. Dry.  No petechiae or purpura.  Eye:  Extraocular movements intact.   PERRLA  Ears, nose, mouth and throat:  Oral mucosa moist, no trismus.  Tympanic membranes bilaterally normal.  Oropharynx with no exudate or erythema.   Neck:  Trachea midline.  Supple.  No cervical lymphadenopathy  Extremity:  No swelling.  Normal ROM.  No calf pain or asymmetric swelling.  No lower extremity edema  Heart:  Regular rate and rhythm, normal S1 & S2, no extra heart sounds.    Perfusion:  Intact, capillary refill less than 2 seconds  Respiratory:  Lungs clear to auscultation bilaterally.  Respirations nonlabored.     Abdominal:  Normal bowel sounds.  Soft.  No peritoneal signs.  No hepatosplenomegaly.  Back:  Left CVA tenderness to palpation.  No bruising.  No CTL tenderness to palpation or step-off  Neurological:  Alert and oriented times 3.  No focal neuro deficits.   Cranial nerves II through XII are grossly intact.  Psychiatric:  Appropriate    I have reviewed and interpreted all of the currently available lab results from this visit (if applicable):  Results for orders placed or performed during the hospital encounter of 07/11/17   CBC Auto Differential   Result Value Ref Range    WBC 7.3 4.0 - 11.0 K/uL    RBC 3.93 (L) 4.00 - 5.20 M/uL    Hemoglobin 13.2 12.0 - 16.0 g/dL    Hematocrit 62.1 30.8 - 48.0 %    MCV 102.9 (H) 80.0 - 100.0 fL    MCH 33.7 26.0 - 34.0  pg    MCHC 32.7 31.0 - 36.0 g/dL    RDW 65.7 84.6 - 96.2 %    Platelets 124 (L) 135 - 450 K/uL    MPV 9.6 5.0 - 10.5 fL    Neutrophils % 74.6 %    Lymphocytes % 17.7 %    Monocytes % 6.4 %    Eosinophils % 0.9 %    Basophils % 0.4 %    Neutrophils # 5.4 1.7 - 7.7 K/uL    Lymphocytes # 1.3 1.0 - 5.1 K/uL    Monocytes # 0.5 0.0 - 1.3 K/uL    Eosinophils # 0.1 0.0 - 0.6 K/uL    Basophils # 0.0 0.0 - 0.2 K/uL   Comprehensive Metabolic Panel   Result Value Ref Range    Sodium 141 136 - 145 mmol/L    Potassium 4.3 3.5 - 5.1 mmol/L    Chloride 105 99 - 110 mmol/L    CO2 26 21 - 32 mmol/L    Anion Gap 10 3 - 16    Glucose 137 (H) 70 - 99 mg/dL    BUN 17 7 - 20 mg/dL    CREATININE 0.9 0.6 - 1.2 mg/dL    GFR Non-African American 59 (A) >60    GFR African American >60 >60    Calcium 9.4 8.3 - 10.6 mg/dL    Total Protein 7.5 6.4 - 8.2 g/dL    Alb 4.1 3.4 - 5.0 g/dL    Albumin/Globulin Ratio 1.2 1.1 - 2.2    Total Bilirubin 0.6 0.0 - 1.0 mg/dL    Alkaline Phosphatase 61 40 - 129 U/L    ALT 24 10 - 40 U/L    AST 25 15 - 37 U/L    Globulin 3.4 g/dL   Lipase   Result Value Ref Range    Lipase 54.0 13.0 - 60.0 U/L   Urine, reflex to culture   Result Value Ref Range    Color, UA RED (A) Straw/Yellow    Clarity, UA CLOUDY (A) Clear    Glucose, Ur Negative Negative mg/dL    Bilirubin Urine Negative Negative    Ketones, Urine Negative Negative mg/dL    Specific Gravity, UA 1.016 1.005 - 1.030    Blood, Urine LARGE (A) Negative    pH, UA 6.5 5.0 - 8.0    Protein, UA 30 (A) Negative mg/dL    Urobilinogen, Urine 1.0 <2.0 E.U./dL    Nitrite, Urine Negative Negative    Leukocyte Esterase, Urine SMALL (A) Negative    Microscopic Examination YES     Urine Reflex to Culture Yes     Urine Type Not Specified    Microscopic Urinalysis   Result Value Ref Range    Hyaline Casts, UA 2 0 - 8 /LPF    WBC, UA 4 0 - 5 /HPF    RBC, UA >900 (H) 0 -  4 /HPF    Epi Cells 1 0 - 5 /HPF      Radiographs (if obtained):  []  The following radiograph was interpreted  by myself in the absence of a radiologist:   []  Radiologist's Report Reviewed:  CT ABDOMEN PELVIS W IV CONTRAST   Preliminary Result   1. The left kidney demonstrates nonobstructing nephrolithiasis and mild   hydronephrosis.  There is a questionable 2-3 mm obstructing distal   ureterolith versus adjacent phlebolith.   2. No evidence of bowel obstruction, perforation, or abscess.  The appendix   is normal.               EKG (if obtained): (All EKG's are interpreted by myself in the absence of a cardiologist)    Chart review shows recent radiographs:  No results found.    MDM:  84 year old female with history as above.  She is mildly hypertensive but afebrile.  She is mentating normally.    Initial treatment with 1 L fluid bolus, Tylenol, Toradol, and Zofran for pain and nausea relief.    Urinalysis with blood but no evidence of acute infectious process.  No leukocytosis.  No anemia.  Normal kidney function.  No metabolic acidosis.  Normal liver enzymes.  CT scan ordered    0530: Patient pain free and very thankful.  She is feeling 100% better.  CT scan with kidney stone.  No other acute intra-abdominal process.  Normal renal function.  No evidence of urinary tract infection.  Nausea is resolved.  Pain is resolved.  Patient has received 1 L normal saline and I'll add one dose of fentanyl and Flomax prior to discharge to ensure complete care.    I estimate there is LOW risk for (including but not limited to) ACUTE APPENDICITIS, BOWEL OBSTRUCTION, ACUTE CHOLECYSTITIS, RUPTURED DIVERTICULITIS, INCARCERATED or STRANGULATED HERNIA, HEMMORHAGIC PANCREATITIS, PERFORATED BOWEL/ULCER, ECTOPIC PREGNANCY, OVARIAN TORSION or TUBO-OVARIAN ABSCESS thus I consider the discharge disposition reasonable. [name] (or their surrogate) and I have discussed the diagnosis and risks, and we agree with discharging home with close follow-up. We also discussed returning to the Emergency Department immediately if new or worsening symptoms  occur. We have discussed the symptoms which are most concerning that necessitate immediate return.    Clinical Impression:  1. Kidney stone      Disposition referral (if applicable):  Violet Baldy, MD  [phone]  [address]  [place] [address]  [phone]    Schedule an appointment as soon as possible for a visit       Disposition medications (if applicable):  New Prescriptions    HYDROCODONE-ACETAMINOPHEN (NORCO) 5-325 MG PER TABLET    Take 1 tablet by mouth every 8 hours as needed for Pain for up to 3 days.    NAPROXEN (NAPROSYN) 250 MG TABLET    Take 1 tablet by mouth 2 times daily (with meals) for 10 days    ONDANSETRON (ZOFRAN) 4 MG TABLET    Take 1 tablet by mouth 3 times daily as needed for Nausea or Vomiting       Comment: Please note this report has been produced using speech recognition software and may contain errors related to that system including errors in grammar, punctuation, and spelling, as well as words and phrases that may be inappropriate. If there are any questions or concerns please feel free to contact the dictating provider for clarification.      Nadyne Coombes, MD  07/11/17 [phone]

## 2017-07-11 NOTE — ED Notes (Signed)
Pt discharged in stable condition, VSS, no signs of distress, discharge instructions and meds reviewed. Pt verbalizes understanding or concerns unaddressed.        Clover Mealy, RN  07/11/17 [phone]

## 2017-07-11 NOTE — Unmapped (Signed)
[facility]. Please go to [email or web address] to view information filed to your patient's chart in Epic.  If you need to view your patient's results prior to gaining access to   Epic CareLink, please contact the [facility] where your patient was seen.             Stephanie Suarez, Stephanie Suarez #[id number] ([id number]) (84 y.o. F) PCP: Tobie Poet [phone])   20           ED Arrival Information     Expected Arrival Acuity Means of Arrival Escorted By Service Admission Type    - 07/11/2017  2:58 AM 3-Urgent Walk In Self Emergency Medicine Emergency      Arrival Complaint    Abdominal/Back pain          Chief Complaint     Complaint Comment    Flank Pain left side flank pain that started 30 minutes ago. +nausea, +urinary frequency        ED Vitals    Date/Time Temp Pulse Resp BP SpO2 Weight Who   07/11/17 0530 -- 81 20 158/59 99 % -- SB   07/11/17 0500 -- -- -- 153/66 99 % -- SB   07/11/17 0430 -- -- -- 151/67 99 % -- SB   07/11/17 0415 -- -- -- -- 99 % -- SB   07/11/17 0345 -- -- -- 165/73 -- -- SB   07/11/17 0330 -- -- -- 164/94 98 % -- SB   07/11/17 0315 -- -- -- 174/85 100 % -- SB   07/11/17 0310 96.9 ????F (36.1 ????C) 75 16 184/72 99 % 120 lb (54.4 kg) SB          Allergies (Review Complete on: 07/11/17)     Agent Severity Comments    Bee Venom            Medical History     Past Medical History      Date Comments   Hypertension [I10]     Hyperlipidemia [E78.5]     Vitamin D deficiency [E55.9]            Surgical History       Obstetric History    No data available     `  ED Provider Notes     Nadyne Coombes, MD 07/11/2017  5:42 AM           Triage Chief Complaint:   Flank Pain (left side flank pain that started 30 minutes ago. +nausea, +urinary frequency)    HOPI:  Stephanie Suarez is a 84 y.o. female that presents with fairly acute onset of left-sided flank pain that started 30 minutes prior to presentation.  She notes nausea but no vomiting.  No  diarrhea constipation.  She does note urinary frequency.  Note hematuria   per the patient.  She states that she has had a urinary tract infection before and this feels different.  No prior history of kidney stones.  No fevers or chills.  No rigors.  No weakness.  No hematemesis or melena.    ROS:  General:  No fevers, no chills, no weakness  Eyes:  No recent vison changes, no discharge  ENT:  No sore throat, no nasal congestion, no hearing changes  Cardiovascular:  No chest pain, no palpitations  Respiratory:  No shortness of breath, no cough, no wheezing  Gastrointestinal: As above  Musculoskeletal:  No muscle pain, no joint pain  Skin:  No  rash, no pruritis, no easy bruising  Neurologic:  No speech problems, no headache, no extremity numbness, no extremity tingling, no extremity weakness  Psychiatric:  No anxiety, no hallucinations or delusions, no suicidal or homicidal ideation  Genitourinary:  No dysuria, no hematuria  Endocrine:  No unexpected weight gain, no unexpected weight loss  Extremities:  no edema, no pain    Past Medical History:   Diagnosis Date   ??????? Hyperlipidemia    ??????? Hypertension    ??????? Vitamin D deficiency      History reviewed. No pertinent surgical history.  History reviewed. No pertinent family history.  Social History     Socioeconomic History   ??????? Marital status: Divorced     Spouse name: Not on file   ??????? Number of children: Not on file   ??????? Years of education: Not on file   ??????? Highest education level: Not on file   Occupational History   ??????? Not on file   Social Needs   ??????? Financial resource strain: Not on file   ??????? Food insecurity:     Worry: Not on file     Inability: Not on file   ??????? Transportation needs:     Medical: Not on file     Non-medical: Not on file   Tobacco Use   ??????? Smoking status: Never Smoker   ??????? Smokeless tobacco: Never Used   Substance and Sexual Activity   ??????? Alcohol use: No   ??????? Drug use: Never   ??????? Sexual activity: Not on file   Lifestyle   ??????? Physical  activity:     Days per week: Not on file     Minutes per session: Not on file   ??????? Stress: Not on file   Relationships   ??????? Social connections:     Talks on phone: Not on file     Gets together: Not on file     Attends religious service: Not on file     Active member of club or organization: Not on file     Attends meetings of clubs or organizations: Not on file     Relationship status: Not on file   ??????? Intimate partner violence:     Fear of current or ex partner: Not on file     Emotionally abused: Not on file     Physically abused: Not on file     Forced sexual activity: Not on file   Other Topics Concern   ??????? Not on file   Social History Narrative   ??????? Not on file     No current facility-administered medications for this encounter.      Current Outpatient Medications   Medication Sig Dispense Refill   ??????? naproxen (NAPROSYN) 250 MG tablet Take 1 tablet by mouth 2 times daily (with meals) for 10 days 20 tablet 0   ??????? ondansetron (ZOFRAN) 4 MG tablet Take 1 tablet by mouth 3 times daily as needed for Nausea or Vomiting 30 tablet 0   ??????? HYDROcodone-acetaminophen (NORCO) 5-325 MG per tablet Take 1 tablet by mouth every 8 hours as needed for Pain for up to 3 days. 6 tablet 0   ??????? Calcium Ascorbate 500 MG TABS Take 500 mg by mouth     ??????? vitamin B-1 (THIAMINE) 100 MG tablet Take 100 mg by mouth daily     ??????? vitamin E 1000 units capsule Take 1,000 Units by mouth daily     ??????? calcium carbonate (OSCAL)  500 MG TABS tablet Take 500 mg by mouth daily     ??????? lisinopril (PRINIVIL;ZESTRIL) 20 MG tablet Take 1 tablet by mouth daily 30 tablet 3   ??????? atorvastatin (LIPITOR) 20 MG tablet Take 1 tablet by mouth daily 30 tablet 3   ??????? vitamin D (CHOLECALCIFEROL) 50000 UNIT CAPS Take 1 capsule by mouth once a week 4 capsule 2     Allergies   Allergen Reactions   ??????? Bee Venom        Nursing Notes Reviewed    Physical Exam:  ED Triage Vitals [07/11/17 0310]   Enc Vitals Group      BP (!) 184/72      Pulse 75       Resp 16      Temp 96.9 ????F (36.1 ????C)      Temp Source Oral      SpO2 99 %      Weight 120 lb (54.4 kg)      Height 5' 2 (1.575 m)      Head Circumference       Peak Flow       Pain Score       Pain Loc       Pain Edu?       Excl. in GC?        My pulse ox interpretation is - normal    General appearance:  No acute distress.   Skin:  Warm. Dry.  No petechiae or purpura.  Eye:  Extraocular movements intact.   PERRLA  Ears, nose, mouth and throat:  Oral mucosa moist, no trismus.  Tympanic membranes bilaterally normal.  Oropharynx with no exudate or erythema.   Neck:  Trachea midline.  Supple.  No cervical lymphadenopathy  Extremity:  No swelling.  Normal ROM.  No calf pain or asymmetric swelling.  No lower extremity edema  Heart:  Regular rate and rhythm, normal S1 & S2, no extra heart sounds.    Perfusion:  Intact, capillary refill less than 2 seconds  Respiratory:  Lungs clear to auscultation bilaterally.  Respirations nonlabored.     Abdominal:  Normal bowel sounds.  Soft.  No peritoneal signs.  No hepatosplenomegaly.  Back:  Left CVA tenderness to palpation.  No bruising.  No CTL tenderness to palpation or step-off  Neurological:  Alert and oriented times 3.  No focal neuro deficits.   Cranial nerves II through XII are grossly intact.          Psychiatric:  Appropriate    I have reviewed and interpreted all of the currently available lab results from this visit (if applicable):  Results for orders placed or performed during the hospital encounter of 07/11/17   CBC Auto Differential   Result Value Ref Range    WBC 7.3 4.0 - 11.0 K/uL    RBC 3.93 (L) 4.00 - 5.20 M/uL    Hemoglobin 13.2 12.0 - 16.0 g/dL    Hematocrit 47.8 29.5 - 48.0 %    MCV 102.9 (H) 80.0 - 100.0 fL    MCH 33.7 26.0 - 34.0 pg    MCHC 32.7 31.0 - 36.0 g/dL    RDW 62.1 30.8 - 65.7 %    Platelets 124 (L) 135 - 450 K/uL    MPV 9.6 5.0 - 10.5 fL    Neutrophils % 74.6 %    Lymphocytes % 17.7 %    Monocytes % 6.4 %    Eosinophils % 0.9 %  Basophils  % 0.4 %    Neutrophils # 5.4 1.7 - 7.7 K/uL    Lymphocytes # 1.3 1.0 - 5.1 K/uL    Monocytes # 0.5 0.0 - 1.3 K/uL    Eosinophils # 0.1 0.0 - 0.6 K/uL    Basophils # 0.0 0.0 - 0.2 K/uL   Comprehensive Metabolic Panel   Result Value Ref Range    Sodium 141 136 - 145 mmol/L    Potassium 4.3 3.5 - 5.1 mmol/L    Chloride 105 99 - 110 mmol/L    CO2 26 21 - 32 mmol/L    Anion Gap 10 3 - 16    Glucose 137 (H) 70 - 99 mg/dL    BUN 17 7 - 20 mg/dL    CREATININE 0.9 0.6 - 1.2 mg/dL    GFR Non-African American 59 (A) >60    GFR African American >60 >60    Calcium 9.4 8.3 - 10.6 mg/dL    Total Protein 7.5 6.4 - 8.2 g/dL    Alb 4.1 3.4 - 5.0 g/dL    Albumin/Globulin Ratio 1.2 1.1 - 2.2    Total Bilirubin 0.6 0.0 - 1.0 mg/dL    Alkaline Phosphatase 61 40 - 129 U/L    ALT 24 10 - 40 U/L    AST 25 15 - 37 U/L    Globulin 3.4 g/dL   Lipase   Result Value Ref Range    Lipase 54.0 13.0 - 60.0 U/L   Urine, reflex to culture   Result Value Ref Range    Color, UA RED (A) Straw/Yellow    Clarity, UA CLOUDY (A) Clear    Glucose, Ur Negative Negative mg/dL    Bilirubin Urine Negative Negative    Ketones, Urine Negative Negative mg/dL    Specific Gravity, UA 1.016 1.005 - 1.030    Blood, Urine LARGE (A) Negative    pH, UA 6.5 5.0 - 8.0    Protein, UA 30 (A) Negative mg/dL    Urobilinogen, Urine 1.0 <2.0 E.U./dL    Nitrite, Urine Negative Negative    Leukocyte Esterase, Urine SMALL (A) Negative    Microscopic Examination YES     Urine Reflex to Culture Yes     Urine Type Not Specified    Microscopic Urinalysis   Result Value Ref Range    Hyaline Casts, UA 2 0 - 8 /LPF    WBC, UA 4 0 - 5 /HPF    RBC, UA >900 (H) 0 - 4 /HPF    Epi Cells 1 0 - 5 /HPF      Radiographs (if obtained):   The following radiograph was interpreted by myself in the absence of a radiologist:    Radiologist's Report Reviewed:  CT ABDOMEN PELVIS W IV CONTRAST   Preliminary Result   1. The left kidney demonstrates nonobstructing nephrolithiasis and mild   hydronephrosis.   There is a questionable 2-3 mm obstructing distal   ureterolith versus adjacent phlebolith.   2. No evidence of bowel obstruction, perforation, or abscess.  The appendix   is normal.               EKG (if obtained): (All EKG's are interpreted by myself in the absence of a cardiologist)    Chart review shows recent radiographs:  No results found.    MDM:  84 year old female with history as above.  She is mildly hypertensive but afebrile.  She is mentating normally.    Initial treatment with  1 L fluid bolus, Tylenol, Toradol, and Zofran for pain and nausea relief.    Urinalysis with blood but no evidence of acute infectious process.  No leukocytosis.  No anemia.  Normal kidney function.  No metabolic acidosis.  Normal liver enzymes.  CT scan ordered    0530: Patient pain free and very thankful.  She is feeling 100% better.  CT scan with kidney stone.  No other acute intra-abdominal process.  Normal renal function.  No evidence of urinary tract infection.  Nausea is resolved.  Pain is resolved.  Patient   has received 1 L normal saline and I'll add one dose of fentanyl and Flomax prior to discharge to ensure complete care.    I estimate there is LOW risk for (including but not limited to) ACUTE APPENDICITIS, BOWEL OBSTRUCTION, ACUTE CHOLECYSTITIS, RUPTURED DIVERTICULITIS, INCARCERATED or STRANGULATED HERNIA, HEMMORHAGIC PANCREATITIS, PERFORATED BOWEL/ULCER, ECTOPIC PREGNANCY,   OVARIAN TORSION or TUBO-OVARIAN ABSCESS thus I consider the discharge disposition reasonable. [name] (or their surrogate) and I have discussed the diagnosis and risks, and we agree with discharging home with close follow-up. We also discussed   returning to the Emergency Department immediately if new or worsening symptoms occur. We have discussed the symptoms which are most concerning that necessitate immediate return.    Clinical Impression:  1. Kidney stone      Disposition referral (if applicable):  Violet Baldy, MD  [phone]  [place] [address]  [phone]    Schedule an appointment as soon as possible for a visit       Disposition medications (if applicable):  New Prescriptions    HYDROCODONE-ACETAMINOPHEN (NORCO) 5-325 MG PER TABLET    Take 1 tablet by mouth every 8 hours as needed for Pain for up to 3 days.    NAPROXEN (NAPROSYN) 250 MG TABLET    Take 1 tablet by mouth 2 times daily (with meals) for 10 days    ONDANSETRON (ZOFRAN) 4 MG TABLET    Take 1 tablet by mouth 3 times daily as needed for Nausea or Vomiting       Comment: Please note this report has been produced using speech recognition software and may contain errors related to that system including errors in grammar, punctuation, and spelling, as well as words and phrases that may be inappropriate. If there   are any questions or concerns please feel free to contact the dictating provider for clarification.      Nadyne Coombes, MD  07/11/17 [phone]             ED Medication Orders (From admission, onward)    Start Ordered     Status Ordering Provider    07/11/17 0530 07/11/17 0517  tamsulosin (FLOMAX) capsule 0.4 mg  ONCE     Last MAR action:  Given - by Clover Mealy on 07/11/17 at 0531 VANDEHOEF, SCOTT A    07/11/17 0530 07/11/17 0517  fentaNYL (SUBLIMAZE) injection 25 mcg  ONCE     Last MAR action:  Given - by Clover Mealy on 07/11/17 at 0534 VANDEHOEF, SCOTT A    07/11/17 0422 07/11/17 0422  iopamidol (ISOVUE-370) 76 % injection 75 mL  IMG ONCE PRN     Last MAR action:  Given - by Alto Denver on 07/11/17 at 0441 VANDEHOEF, SCOTT A    07/11/17 0400 07/11/17 0350  ondansetron (ZOFRAN) injection 8 mg  ONCE     Last MAR action:  Given -  by Clover Mealy on 07/11/17 at 0532 VANDEHOEF, SCOTT A    07/11/17 0330 07/11/17 0319  0.9 % sodium chloride bolus  ONCE     Last MAR action:  New Bag - by Clover Mealy on 07/11/17 at 0337 VANDEHOEF, Lorin Picket A    07/11/17 0330 07/11/17 0319  acetaminophen (TYLENOL) tablet 1,000 mg  ONCE     Last MAR action:  Given - by  Clover Mealy on 07/11/17 at 0337 VANDEHOEF, Lorin Picket A    07/11/17 0330 07/11/17 0319  ketorolac (TORADOL) injection 15 mg  ONCE     Last MAR action:  Given - by Clover Mealy on 07/11/17 at 0337 Si Raider A        Lab Results           Urine, reflex to culture (Final result)  Result time 07/11/17 04:19:01    Collection Time Result Time COLOR U CLARITY U GLUCOSE U BILI UR KETONES U SPEC GRAV BLOOD U PH, UR Protein, UA Urobilinogen, Urine   07/11/17 04:05:00 07/11/17 04:17:00 RED   CLOUDY Negative Negative Negative 1.016 LARGE 6.5 30 1.0     Previous Results   06/02/13 12:14:00 06/02/13 12:22:00 Yellow Clear Negative Negative Negative 1.020 Negative 7.0 Negative 0.2   08/23/08 06:20:00 08/23/08 07:28:00 YELLOW CLEAR NEGATIVE   NEGATIVE NEGATIVE >=1.030 NEGATIVE 5.5 NEGATIVE 0.2       Collection Time Result Time NITRITE LEUKOCYTES, UR Microscopic Examination Urine Reflex to Culture Urine Type   07/11/17 04:05:00 07/11/17 04:17:00 Negative SMALL YES Yes Not Specified       Previous Results   06/02/13 12:14:00 06/02/13 12:22:00 Negative Negative Not Indicated  Not Specified   08/23/08 06:20:00 08/23/08 07:28:00 NEGATIVE TRACE              Final result            Narrative:   Performed at: [facility] [address]  [place], [place] [address]   Phone [phone]                        Microscopic Urinalysis (Final result)  Result time 07/11/17 04:21:13    Collection Time Result Time Hyaline Casts, UA WBC UA RBC  UA Epi Cells   07/11/17 04:05:00 07/11/17 04:21:00 2 4 >900 1 Urinalysis microscopic performed using the automated   methodology (AUWI analyzer).      Previous Results   08/23/08 06:20:00 08/23/08 07:28:00  6-10 Rare 3-5           Final result            Narrative:   Performed at: [facility] [address]  [place], [place] [address]   Phone   [phone]                        CBC Auto Differential (Final result)  Result time 07/11/17  03:59:49    Collection Time Result Time WBC RBC HGB HCT MCV MCH MCHC RDW PLT MPV   07/11/17 03:40:00 07/11/17 03:59:00 7.3 3.93 13.2 40.4 102.9 33.7 32.7 13.2 124 9.6     Previous Results     06/20/16 05:24:00 06/20/16 05:31:00 4.4 3.72 12.4 37.8 101.4 33.3 32.9 13.3 131 9.2   06/18/13 15:40:00 06/19/13 02:51:00 5.1 3.77 12.8 38.8 102.9 33.9 32.9 13.4 97 13.0   06/02/13 11:24:00 06/02/13 11:45:00 6.2 3.93 13.4 40.2 102.3 34.0 33.3  13.4 157   9.3   09/06/12 13:05:00 09/06/12 14:22:00 4.7 3.62 12.0 36.1 99.8 33.3 33.3 12.6 143 8.8       Collection Time Result Time NEUTRO PCT LYMPHO PCT MONO PCT EOS BASOS PCT NEUTRO ABS LYMPHS ABS MONOS ABS EOS ABS BASOS ABS   07/11/17 03:40:00 07/11/17   03:59:00 74.6 17.7 6.4 0.9 0.4 5.4 1.3 0.5 0.1 0.0     Previous Results   06/20/16 05:24:00 06/20/16 05:31:00             06/18/13 15:40:00 06/19/13 02:51:00 62.9 26.9 8.2 1.8 0.2 3.2 1.4 0.4 0.1 0.0   06/02/13 11:24:00 06/02/13 11:45:00               09/06/12 13:05:00 09/06/12 14:22:00 68.0 23.7 7.1 0.8 0.4 3.2 1.1 0.3 0.0 0.0       Collection Time Result Time PLATELET SLIDE REVIEW   07/11/17 03:40:00 07/11/17 03:59:00      Previous Results   06/20/16 05:24:00 06/20/16 05:31:00    06/18/13 15:40:00   06/19/13 02:51:00 Decreased   06/02/13 11:24:00 06/02/13 11:45:00    09/06/12 13:05:00 09/06/12 14:22:00            Final result            Narrative:   Performed at: [facility] [address],  [place], [place] [address]     Phone [phone]                        Comprehensive Metabolic Panel (Final result)  Result time 07/11/17 04:06:01    Collection Time Result Time NA K CL CO2 ANION GAP GLUCOSE BUN CREATININE GFR Non-African American GFR African American   07/11/17 03:40:00 07/11/17 04:05:00 141 4.3 105 26   10 137 17 0.9 59 >60 mL/min/1.75m2 EGFR, calc. for ages 21 and older using the MDRD formula (not corrected for weight), is valid for stable renal function.  >60 Chronic Kidney Disease: less than 60  ml/min/1.73 sq.m.         Kidney Failure: less than 15   ml/min/1.73 sq.m. Results valid for patients 18 years and older.      Previous Results   06/18/13 15:40:00 06/19/13 00:31:00 143 4.4 105 25 13 91 16 1.0 53>60 mL/min/1.40m2 EGFR, calc. for ages 53 and older using the MDRD formula (not corrected for   weight), is valid for stable renal function.  >60Chronic Kidney Disease: less than 60 ml/min/1.73 sq.m.         Kidney Failure: less than 15 ml/min/1.73 sq.m. Results valid for patients 18 years and older.    06/02/13 11:24:00 06/02/13 12:02:00 143 4.2   103 27 13 94 15 0.9 60>60 mL/min/1.55m2 EGFR, calc. for ages 77 and older using the MDRD formula (not corrected for weight), is valid for stable renal function.  >60Chronic Kidney Disease: less than 60 ml/min/1.73 sq.m.         Kidney Failure: less than   15 ml/min/1.73 sq.m. Results valid for patients 18 years and older.    09/06/12 13:05:00 09/06/12 14:38:00 141 3.8 105 30  94 16 0.9 >60>60 mL/min/1.48m2 EGFR, calc. for ages 74 and older using the MDRD formula (not corrected for weight), is valid for   stable renal function. >60Chronic Kidney Disease: less than 60 ml/min/1.73 sq.m.         Kidney Failure: less than 15 ml/min/1.73 sq.m. Results valid for patients 18 years and older.  Collection Time Result Time CALCIUM PROTEIN Alb Albumin/Globulin   Ratio BILI TOTAL ALK PHOS ALT AST Globulin   07/11/17 03:40:00 07/11/17 04:05:00 9.4 7.5 4.1 1.2 0.6 61 24 25 3.4     Previous Results   06/18/13 15:40:00 06/19/13 00:31:00 9.2 6.7 3.9 1.4 0.3 53 10 18Specimen hemolysis has exceeded the interference as   defined by Roche. Value may be falsely increased. Suggest recollection if clinically indicated.  2.8   06/02/13 11:24:00 06/02/13 12:02:00 9.6 7.4 4.0 1.2 0.3 60 10 15 3.4   09/06/12 13:05:00 09/06/12 14:38:00 9.4                   Final result              Narrative:   Performed at: [facility] [address]  [place], [place]  [address]   Phone [phone]                       Lipase (Final result)  Result time 07/11/17 04:06:01    Collection Time Result Time LIPASE   07/11/17 03:40:00 07/11/17 04:05:00 54.0     Previous Results   06/02/13 11:24:00 06/02/13 12:02:00 53.0           Final result            Narrative:     Performed at: [facility] [address],  [place], [place] [address]   Phone [phone]                    Imaging Results          CT ABDOMEN PELVIS W IV CONTRAST (Preliminary result)  Result time 07/11/17 05:13:29   Preliminary result by Fulton Mole, MD (07/11/17 05:13:29)            Impression:   1. The left kidney demonstrates nonobstructing nephrolithiasis and mild   hydronephrosis.  There is a questionable 2-3 mm obstructing distal ureterolith versus adjacent phlebolith. 2. No evidence of bowel obstruction, perforation, or abscess.  The appendix is normal.                     ED Administered Medications from 07/11/2017 0258 to 07/11/2017 0542       Date/Time Order Dose Route Action Action by Comments     07/11/2017 0337 0.9 % sodium chloride bolus 1,000 mL Intravenous [address] Clover Mealy, RN      07/11/2017 0541 0.9 % sodium chloride bolus   Intravenous Canceled Entry Clover Mealy, RN      07/11/2017 [phone] acetaminophen (TYLENOL) tablet 1,000 mg 1,000 mg Oral Given Clover Mealy, RN      07/11/2017 0534 fentaNYL (SUBLIMAZE) injection 25 mcg 25 mcg Intravenous Given Clover Mealy, RN      07/11/2017 0441 iopamidol (ISOVUE-370) 76 % injection 75 mL 75 mL Intravenous Given Alto Denver      07/11/2017 6962 ketorolac (TORADOL) injection 15 mg 15 mg Intravenous Given Clover Mealy, RN      07/11/2017 0532 ondansetron (ZOFRAN) injection 8 mg 8 mg Intravenous Given Clover Mealy, RN      07/11/2017 0531 tamsulosin (FLOMAX) capsule 0.4 mg 0.4 mg Oral Given Clover Mealy, RN         ED Treatment Team     Provider Role From To Phone Pager    Nadyne Coombes, MD Attending  Provider 07/11/17 [phone] --  [phone]         Code Onset/Outcome    None     Code Interventions/Drips/Airways    None       Diagnosis     Diagnosis Comment    Kidney stone           ED Prescriptions     Medication Sig Dispense Start Date End Date Auth. Provider    naproxen (NAPROSYN) 250 MG tablet Take 1 tablet by mouth 2 times daily (with meals) for 10 days 20 tablet 07/11/2017 07/21/2017 Nadyne Coombes, MD    ondansetron (ZOFRAN) 4 MG tablet Take 1 tablet by mouth 3 times daily as needed for Nausea or Vomiting 30 tablet 07/11/2017  Nadyne Coombes, MD    HYDROcodone-acetaminophen (NORCO) 5-325 MG per tablet Take 1 tablet by mouth every 8 hours as needed for Pain for up to 3 days. 6 tablet 07/11/2017 07/14/2017 Nadyne Coombes, MD          Follow-up Information     Follow up With Specialties Details Why Contact Info    Violet Baldy, MD Urology Schedule an appointment as soon as possible for a visit   [address] [place] [address] [phone]           Discharge Instructions    None

## 2017-07-12 LAB — CULTURE, URINE: Urine Culture, Routine: NO GROWTH

## 2017-07-20 ENCOUNTER — Emergency Department: Admit: 2017-07-20 | Payer: MEDICARE | Primary: Nurse Practitioner

## 2017-07-20 ENCOUNTER — Inpatient Hospital Stay
Admit: 2017-07-20 | Discharge: 2017-07-20 | Disposition: A | Discharge status: Home / Self Care | Payer: MEDICARE | Attending: Emergency Medicine

## 2017-07-20 DIAGNOSIS — N132 Hydronephrosis with renal and ureteral calculous obstruction: Secondary | ICD-10-CM

## 2017-07-20 LAB — URINALYSIS WITH REFLEX TO CULTURE
Bilirubin Urine: NEGATIVE
Blood, Urine: NEGATIVE
Glucose, Ur: NEGATIVE mg/dL
Ketones, Urine: 15 mg/dL — AB
Nitrite, Urine: NEGATIVE
Specific Gravity, UA: 1.017 (ref 1.005–1.030)
Urobilinogen, Urine: 1 E.U./dL (ref <2.0)
pH, UA: 6.5 (ref 5.0–8.0)

## 2017-07-20 LAB — COMPREHENSIVE METABOLIC PANEL
ALT: 17 U/L (ref 10–40)
AST: 29 U/L (ref 15–37)
Albumin/Globulin Ratio: 1 — ABNORMAL LOW (ref 1.1–2.2)
Albumin: 3.8 g/dL (ref 3.4–5.0)
Alkaline Phosphatase: 61 U/L (ref 40–129)
Anion Gap: 13 (ref 3–16)
BUN: 16 mg/dL (ref 7–20)
CO2: 27 mmol/L (ref 21–32)
Calcium: 9.6 mg/dL (ref 8.3–10.6)
Chloride: 100 mmol/L (ref 99–110)
Creatinine: 0.9 mg/dL (ref 0.6–1.2)
GFR African American: >60 (ref >60)
GFR Non-African American: 59 — AB (ref >60)
Globulin: 3.7 g/dL
Glucose: 111 mg/dL — ABNORMAL HIGH (ref 70–99)
Potassium: 4.4 mmol/L (ref 3.5–5.1)
Sodium: 140 mmol/L (ref 136–145)
Total Bilirubin: 0.6 mg/dL (ref 0.0–1.0)
Total Protein: 7.5 g/dL (ref 6.4–8.2)

## 2017-07-20 LAB — CBC WITH AUTO DIFFERENTIAL
Basophils %: 0.4 %
Basophils Absolute: 0 10*3/uL (ref 0.0–0.2)
Eosinophils %: 0.4 %
Eosinophils Absolute: 0 10*3/uL (ref 0.0–0.6)
Hematocrit: 40.7 % (ref 36.0–48.0)
Hemoglobin: 13.3 g/dL (ref 12.0–16.0)
Lymphocytes %: 11.9 %
Lymphocytes Absolute: 0.9 10*3/uL — ABNORMAL LOW (ref 1.0–5.1)
MCH: 33 pg (ref 26.0–34.0)
MCHC: 32.7 g/dL (ref 31.0–36.0)
MCV: 100.8 fL — ABNORMAL HIGH (ref 80.0–100.0)
MPV: 9.2 fL (ref 5.0–10.5)
Monocytes %: 8.8 %
Monocytes Absolute: 0.7 10*3/uL (ref 0.0–1.3)
Neutrophils %: 78.5 %
Neutrophils Absolute: 6.1 10*3/uL (ref 1.7–7.7)
Platelets: 181 10*3/uL (ref 135–450)
RBC: 4.03 M/uL (ref 4.00–5.20)
RDW: 12.6 % (ref 12.4–15.4)
WBC: 7.8 10*3/uL (ref 4.0–11.0)

## 2017-07-20 LAB — LIPASE: Lipase: 39 U/L (ref 13.0–60.0)

## 2017-07-20 LAB — MICROSCOPIC URINALYSIS
Epithelial Cells, UA: 2 /HPF (ref 0–5)
Hyaline Casts, UA: 7 /LPF (ref 0–8)
WBC, UA: 37 /HPF — ABNORMAL HIGH (ref 0–5)

## 2017-07-20 MED ORDER — HYDROCODONE-ACETAMINOPHEN 5-325 MG PO TABS
5-325 | ORAL_TABLET | Freq: Four times a day (QID) | ORAL | 0 refills | Status: AC | PRN
Start: 2017-07-20 — End: 2017-07-23

## 2017-07-20 MED ORDER — ONDANSETRON HCL 4 MG PO TABS
4 | ORAL_TABLET | Freq: Three times a day (TID) | ORAL | 0 refills | Status: DC | PRN
Start: 2017-07-20 — End: 2017-08-15

## 2017-07-20 MED ORDER — TAMSULOSIN HCL 0.4 MG PO CAPS
0.4 | ORAL_CAPSULE | Freq: Every day | ORAL | 0 refills | Status: DC
Start: 2017-07-20 — End: 2021-08-24

## 2017-07-20 NOTE — ED Notes (Signed)
Pt C/O abdominal pain in left lower abdomen, Bowel sounds present and active in all 4 quadrants, tenderness on palpation in lower abdomen. Pt does not have distention or rigid abdominal areas. Pt states no N/V, no blood, dysuria, or increased frequency of urination and Bowel movement formed, brown and no blood reported. States that she was diagnosed with kidney infection and stones prior in the week and pain has increased. IV established. Blood sent to lab. Family at bedside. Will continue to monitor.      Louis Matte, RN  07/20/17 1108

## 2017-07-20 NOTE — ED Notes (Signed)
Patient ambulated to restroom without signs of distress. Placed back in bed placed on monitor.      Louis Matte, RN  07/20/17 1110

## 2017-07-20 NOTE — ED Provider Notes (Signed)
I independently evaluated and obtained a history and physical on [name].    All diagnostic, treatment, and disposition assistants were made to myself in conjunction the advanced practice provider.    For further details of this patient's emergency department encounter, please see the advanced practice provider's documentation.    History: 84 year old female presents to the emergency room with complaint of continuing left flank pain.  Patient was seen on April 18 and noted to have a left-sided stone.  Thought to be 3 mm however CT was with contrast it difficult to fully evaluate.  She was discharged home with Zofran, hydrocodone and naproxen and told to follow up with urology.  She saw her primary care doctor a few days later.  She obtained a urinalysis.  She then went to see urology who evaluated her and discussed continuing to monitor the symptoms.  She reports her primary care doctor called her back to the office and stated that she had possible urinary tract infection.  She was started on an unknown antibiotic and was told to stop taking all of the other medication she was given in the emergency room.  She has only been taking Tylenol and antibiotic per her and the family member here.    Physician Exam: Constitutional: No acute distress, heart: Regular rate rhythm, no murmur, respiratory: Clear breath sounds bilaterally, abdomen: Mild tenderness to palpation in the left lower quadrant, no rebound, no guarding, no rigidity, negative Eulah Pont, negative McBurney    MDM:  84 year old female presents with persistent left lower quadrant pain.  Vital signs stable.  Physical exam as above.  She has no peritoneal signs.  She does have point tenderness in the left lower quadrant.  She appears well otherwise healthy.  She appears nontoxic and her pain seems to be under control.  A CT scan of her abdomen and pelvis was performed that shows a distal 4 mm stone with mild hydronephrosis.  Also shows worsening WBCs in the  urine.  We'll discuss with urology.        Rueben Bash, DO  07/20/17 1206

## 2017-07-20 NOTE — ED Notes (Signed)
Pt Discharged in stable condition, VSS, no signs of distress, discharge instructions and meds reviewed. Pt verbalizes understanding and states no further questions or concerns unaddressed.       Louis Matte, RN  07/20/17 1235

## 2017-07-20 NOTE — ED Notes (Signed)
Patient to catscan      Louis Matte, RN  07/20/17 [phone]

## 2017-07-20 NOTE — ED Notes (Signed)
Pt Discharged in stable condition, VSS, no signs of distress, discharge instructions and meds reviewed. Pt verbalizes understanding and states no further questions or concerns unaddressed.       Louis Matte, RN  07/20/17 1236

## 2017-07-20 NOTE — ED Provider Notes (Signed)
[facility]  eMERGENCY dEPARTMENT eNCOUnter        Pt Name: Amber Palmer  MRN: [id number]  [place] [birth year]  Date of evaluation: 07/20/2017  Provider: Eleanora Neighbor, PA-C  PCP: Pat Patrick SAXENA    This patient was seen and evaluated by the attending physician Dr. Erskine Speed      CHIEF COMPLAINT       Chief Complaint   Patient presents with   ??? Flank Pain     pt diagnosed with kidney stone, kidney infeciton and bladder infection earlier in the week. followed up with urology and her PCP. her PCP stopped the pain medication and nausea medication and nausea/pain has returned. patient requesting something for symptom relief.        HISTORY OF PRESENT ILLNESS   (Location/Symptom, Timing/Onset, Context/Setting, Quality, Duration, Modifying Factors, Severity)  Note limiting factors.     Amber Palmer is a 84 y.o. female who presents complaining of left-sided flank pain with radiation to left upper abdomen for over one week.  She associates this with nausea.  On 07/11/17, she was seen in the emergency department for the same exact presentation.  She was diagnosed with kidney stone and infection of kidney.  She was sent home with pain medicine, anti-emetic, and flomax.  She finished the course of these medications.  She did follow up with Dr. Clovis Riley, urologist and her primary care doctor.  She returns today with the same presentation.  She is laying comfortably in bed, currently rating her pain to be a 4 out of 10 on pain scale.  Denies chest pain, shortness of breath, cough, congestion, palpitations, hemoptysis, fever or chills.  Denies hematuria, urinary frequency, urgency, diarrhea.  She reports some constipation but this is chronic.  Denies obstipation. Her PCP called her a few days ago after she gave her a urine sample. She was told that she had mild infection and was placed on antibiotics which she has been taking for 3 or 4 days.     EXAMINATION:   CT OF THE ABDOMEN AND  PELVIS WITH CONTRAST 07/11/2017 4:23 am   ??   TECHNIQUE:   CT of the abdomen and pelvis was performed with the administration of   intravenous contrast. Multiplanar reformatted images are provided for review.   Dose modulation, iterative reconstruction, and/or weight based adjustment of   the mA/kV was utilized to reduce the radiation dose to as low as reasonably   achievable.   ??   COMPARISON:   None.   ??   HISTORY:   ORDERING SYSTEM PROVIDED HISTORY: n/v/flank pain   TECHNOLOGIST PROVIDED HISTORY:   IV contrast only. Thank you.   Ordering Physician Provided Reason for Exam: abdominal pain   Acuity: Unknown   Type of Exam: Unknown   Additional signs and symptoms: emesis   Relevant Medical/Surgical History: (left side flank pain that started 30   minutes ago. +nausea, +urinary frequency)   ??   FINDINGS:   Thorax base: ??Normal heart size with no pericardial effusion. ??Coronary and   descending thoracic aortic vascular calcifications. ??The lung bases   demonstrate mild atelectasis with no lobar consolidation or pleural effusion.   ??   Abdomen: ??Abdominal aortic atherosclerosis and mild ectasia with no aneurysm   formation. ??Within the liver there are several scattered subcentimeter cysts,   no follow-up imaging is recommended. ??The hepatic veins are patent. ??The   portal vasculature is patent. ??Mild intrahepatic biliary dilatation. ??The  gallbladder appears unremarkable. ??The common bile duct and pancreas are   normal. ??The spleen and adrenals are normal. ??The right kidney demonstrates   several subcentimeter cyst, no follow-up imaging is recommended. ??There are   multiple left renal cysts ranging from 0.8 through 5.3 cm, no follow-up   imaging is needed. ??There is mild right hydronephrosis with nonobstructing   calculi ranging from 2-3 mm. ??Mild perinephric stranding. ??The left ureter   demonstrates mild distention the mid to distal ureter is difficult to see due   to adjacent structures. ??In the pelvis there are  multiple phleboliths. ??There   is a 2 mm calculus adjacent to the left UVJ.   ??   The stomach, small bowel, and appendix are normal. ??The colon is unremarkable.   ??   No ascites or free air. ??No abscess.   ??   Pelvis: ??The bladder demonstrates mild distention with no wall thickening or   intraluminal gas foci. ??The uterus is normal. ??No significant ascites.   ??   Musculoskeletal structures: ??No diffuse body wall edema. ??No significant   inguinal lymphadenopathy. ??Normal lumbar spine and pelvic alignment with no   acute osseous abnormality.   ??   ??   Impression   1. The left kidney demonstrates nonobstructing nephrolithiasis and mild   hydronephrosis. ??There is a questionable 2-3 mm obstructing distal   ureterolith versus adjacent phlebolith.   2. No evidence of bowel obstruction, perforation, or abscess. ??The appendix   is normal.         Nursing Notes were all reviewed and agreed with or any disagreements were addressed  in the HPI.    REVIEW OF SYSTEMS    (2-9 systems for level 4, 10 or more for level 5)     Review of Systems   Constitutional: Negative for chills and fever.   HENT: Negative.    Eyes: Negative for visual disturbance.   Respiratory: Negative for cough, shortness of breath, wheezing and stridor.    Cardiovascular: Negative for chest pain, palpitations and leg swelling.   Gastrointestinal: Positive for constipation and nausea. Negative for abdominal distention, abdominal pain, anal bleeding, blood in stool, diarrhea, rectal pain and vomiting.   Endocrine: Negative.    Genitourinary: Positive for flank pain. Negative for difficulty urinating, dysuria, frequency, hematuria, pelvic pain, urgency, vaginal bleeding and vaginal discharge.   Musculoskeletal: Negative for back pain, neck pain and neck stiffness.   Skin: Negative for color change, pallor, rash and wound.   Allergic/Immunologic: Negative.    Neurological: Negative for dizziness, tremors, seizures, syncope, facial asymmetry, speech difficulty,  weakness, light-headedness, numbness and headaches.   Hematological: Negative.    Psychiatric/Behavioral: Negative for confusion.   All other systems reviewed and are negative.      Positives and Pertinent negatives as per HPI.  Except as noted abovein the ROS, all other systems were reviewed and negative.       PAST MEDICAL HISTORY     Past Medical History:   Diagnosis Date   ??? Hyperlipidemia    ??? Hypertension    ??? Vitamin D deficiency          SURGICAL HISTORY   History reviewed. No pertinent surgical history.      CURRENTMEDICATIONS       Previous Medications    ASCORBIC ACID (VITAMIN C) 250 MG TABLET    Take 250 mg by mouth daily    ATORVASTATIN (LIPITOR) 20 MG TABLET    Take 1 tablet by  mouth daily    CALCIUM ASCORBATE 500 MG TABS    Take 500 mg by mouth    CALCIUM CARBONATE (OSCAL) 500 MG TABS TABLET    Take 500 mg by mouth daily    FERROUS SULFATE 325 (65 FE) MG TABLET    Take 325 mg by mouth daily (with breakfast)    LISINOPRIL (PRINIVIL;ZESTRIL) 20 MG TABLET    Take 1 tablet by mouth daily    NAPROXEN (NAPROSYN) 250 MG TABLET    Take 1 tablet by mouth 2 times daily (with meals) for 10 days    VITAMIN B-1 (THIAMINE) 100 MG TABLET    Take 100 mg by mouth daily    VITAMIN D (CHOLECALCIFEROL) 50000 UNIT CAPS    Take 1 capsule by mouth once a week    VITAMIN E 1000 UNITS CAPSULE    Take 1,000 Units by mouth daily         ALLERGIES     Bee venom    FAMILYHISTORY     History reviewed. No pertinent family history.       SOCIAL HISTORY       Social History     Socioeconomic History   ??? Marital status: Divorced     Spouse name: None   ??? Number of children: None   ??? Years of education: None   ??? Highest education level: None   Occupational History   ??? None   Social Needs   ??? Financial resource strain: None   ??? Food insecurity:     Worry: None     Inability: None   ??? Transportation needs:     Medical: None     Non-medical: None   Tobacco Use   ??? Smoking status: Never Smoker   ??? Smokeless tobacco: Never Used   Substance  and Sexual Activity   ??? Alcohol use: No   ??? Drug use: Never   ??? Sexual activity: None   Lifestyle   ??? Physical activity:     Days per week: None     Minutes per session: None   ??? Stress: None   Relationships   ??? Social connections:     Talks on phone: None     Gets together: None     Attends religious service: None     Active member of club or organization: None     Attends meetings of clubs or organizations: None     Relationship status: None   ??? Intimate partner violence:     Fear of current or ex partner: None     Emotionally abused: None     Physically abused: None     Forced sexual activity: None   Other Topics Concern   ??? None   Social History Narrative   ??? None       SCREENINGS             PHYSICAL EXAM    (up to 7 for level 4, 8 or more for level 5)     ED Triage Vitals [07/20/17 1033]   BP Temp Temp Source Pulse Resp SpO2 Height Weight   (!) 163/60 98.1 ??F (36.7 ??C) Infrared 70 12 100 % 5\' 1"  (1.549 m) 117 lb (53.1 kg)       Physical Exam   Constitutional: She is oriented to person, place, and time. She appears well-developed and well-nourished. No distress.   HENT:   Head: Normocephalic and atraumatic.   Right Ear: External ear normal.  Left Ear: External ear normal.   Nose: Nose normal.   Mouth/Throat: Oropharynx is clear and moist.   Eyes: Pupils are equal, round, and reactive to light. Conjunctivae and EOM are normal. Right eye exhibits no discharge. Left eye exhibits no discharge. No scleral icterus.   Neck: Normal range of motion. No JVD present. No Brudzinski's sign and no Kernig's sign noted.   Cardiovascular: Normal rate.   Pulmonary/Chest: Effort normal and breath sounds normal.   Abdominal: Soft. Bowel sounds are normal. She exhibits no abdominal bruit and no pulsatile midline mass. There is tenderness (minimal to deep palpation) in the left upper quadrant. There is no rigidity, no rebound, no guarding, no CVA tenderness (left minimally), no tenderness at McBurney's point and negative Murphy's  sign.   Musculoskeletal: Normal range of motion.        Cervical back: Normal.        Thoracic back: She exhibits tenderness (left sided musculature worse with movement). She exhibits no bony tenderness, no swelling, no edema, no deformity and no laceration.        Lumbar back: Normal.   No midline vertebral tenderness or step-off deformity.   Negative straight leg raise.   No saddle paresthesia or foot drop.   Lymphadenopathy:     She has no cervical adenopathy.   Neurological: She is alert and oriented to person, place, and time. She displays normal reflexes. No sensory deficit.   Skin: Skin is warm and dry. Capillary refill takes less than 2 seconds. No rash noted. She is not diaphoretic. No erythema. No pallor.   Psychiatric: She has a normal mood and affect. Her behavior is normal.   Nursing note and vitals reviewed.      DIAGNOSTIC RESULTS   LABS:    Labs Reviewed   CBC WITH AUTO DIFFERENTIAL - Abnormal; Notable for the following components:       Result Value    MCV 100.8 (*)     Lymphocytes # 0.9 (*)     All other components within normal limits    Narrative:     Performed at:  [facility]  [address],  [place] [address]   Phone [phone]   COMPREHENSIVE METABOLIC PANEL - Abnormal; Notable for the following components:    Glucose 111 (*)     GFR Non-African American 59 (*)     Albumin/Globulin Ratio 1.0 (*)     All other components within normal limits    Narrative:     Performed at:  [facility]  [address],  [place] [address]   Phone [phone]   URINE RT REFLEX TO CULTURE - Abnormal; Notable for the following components:    Ketones, Urine 15 (*)     Protein, UA TRACE (*)     Leukocyte Esterase, Urine MODERATE (*)     All other components within normal limits    Narrative:     Performed at:  [facility]  [address]  [place] [address]   Phone [phone]   MICROSCOPIC URINALYSIS  - Abnormal; Notable for the following components:    Bacteria, UA 1+ (*)     WBC, UA 37 (*)     All other components within normal limits    Narrative:     Performed at:  [facility]  [address],  [place] [address]  Phone [phone]   URINE CULTURE   LIPASE    Narrative:     Performed at:  [facility]  [address]  [place] [address]   Phone [phone]       All other labs were within normal range or not returned as of this dictation.    EKG: All EKG's are interpreted by the Emergency Department Physician who either signs orCo-signs this chart in the absence of a cardiologist.  Please see their note for interpretation of EKG.      RADIOLOGY:   Non-plain film images such as CT, Ultrasound and MRI are read by the radiologist. Plain radiographic images are visualized andpreliminarily interpreted by the  ED Provider with the below findings:        Interpretation perthe Radiologist below, if available at the time of this note:    CT ABDOMEN PELVIS WO CONTRAST Additional Contrast? None   Final Result   Mild left hydroureteronephrosis secondary to a 4 mm distal left ureteral   calculus.                 PROCEDURES   Unless otherwise noted below, none     Procedures    CRITICAL CARE TIME   N/A    CONSULTS:  IP CONSULT TO UROLOGY  I spoke with Dr> Angela Adam at 1230. He says that if patient has only been on the antibiotics for a few days, to go culture the urine and have  Her remain on this medicine. If her pain is well controlled, then she can go home and be sent home with medicine for her discomfort and nausea and followup on Monday or Tuesday morning with a urologist in their group.     EMERGENCY DEPARTMENT COURSE and DIFFERENTIALDIAGNOSIS/MDM:   Vitals:    Vitals:    07/20/17 1033 07/20/17 1140   BP: (!) 163/60 (!) 154/87   Pulse: 70 64   Resp: 12    Temp: 98.1 ??F (36.7 ??C)    TempSrc: Infrared    SpO2: 100% 98%   Weight: 117 lb (53.1 kg)     Height: 5\' 1"  (1.549 m)        Patient was given thefollowing medications:  Medications - No data to display    This patient resents complaining of left flank pain and nausea.  She has no emesis throughout stay here.  Renal function preserved.  No white count or fever at this time.  Urinalysis suggests mild infection but patient is currently on antibiotics and per urologist has been instructed to remain on this medicine.  I will add Flomax, Zofran and pain medicine again to treat a distal left ureteral calculus with mild hydroureteronephrosis.  Given her mild discomfort, urologist feels that she can be sent home with close follow-up in their office on Monday or Tuesday.  Patient and family understand and agree with plan.My suspicion is low for acute surgical abdomen, obstruction, perforation, abscess, mesenteric ischemia, AAA, dissection, cholecystitis, cholangitis, pancreatitis, appendicitis, C. diff colitis, diverticulitis, volvulus, incarcerated hernia, necrotizing fasciitis, TOA, ovarian torsion, PID, ectopic pregnancy, fitz hugh Curtis syndrome,  Severe pyelonephritis, perinephric abscess,  urosepsis, fistula or other concerning pathology. We have addressed concerns and expectations.         FINAL IMPRESSION      1. Left ureteral calculus-with infection          DISPOSITION/PLAN   DISPOSITION Decision To Discharge 07/20/2017 12:29:35 PM  PATIENT REFERREDTO:  Lillia Carmel  [place] [address]  [phone]    In 3 days      Drucie Ip, MD  [address]  [place] [address]  [phone]      for urology follow up on [place]:  New Prescriptions    HYDROCODONE-ACETAMINOPHEN (NORCO) 5-325 MG PER TABLET    Take 1 tablet by mouth every 6 hours as needed for Pain for up to 3 days.    ONDANSETRON (ZOFRAN) 4 MG TABLET    Take 1 tablet by mouth every 8 hours as needed for Nausea    TAMSULOSIN (FLOMAX) 0.4 MG CAPSULE     Take 1 capsule by mouth daily for 5 doses       DISCONTINUED MEDICATIONS:  Discontinued Medications    ONDANSETRON (ZOFRAN) 4 MG TABLET    Take 1 tablet by mouth 3 times daily as needed for Nausea or Vomiting              (Please note that portions ofthis note were completed with a voice recognition program.  Efforts were made to edit the dictations but occasionally words are mis-transcribed.)    Eleanora Neighbor, PA-C (electronically signed)           Eleanora Neighbor, PA-C  07/20/17 1238

## 2017-07-20 NOTE — Unmapped (Signed)
Your patient was seen at a [facility]. Please go to [email or web address] to view information filed to your patient's chart in Epic.  If you need to view your patient's results prior to gaining access to   Epic CareLink, please contact the [facility] where your patient was seen.             Stephanie Suarez, [place] #[id number] ([id number]) (84 y.o. F) PCP: Tobie Poet [phone])   21           ED Arrival Information     Expected Arrival Acuity Means of Arrival Escorted By Service Admission Type    - 07/20/2017 10:26 AM 3-Urgent Walk In Self Emergency Medicine Emergency      Arrival Complaint    ABD PAIN          Chief Complaint     Complaint Comment    Flank Pain pt diagnosed with kidney stone, kidney infeciton and bladder infection earlier in the week. followed up with urology and her PCP. her PCP stopped the pain medication and nausea medication and nausea/pain has returned. patient requesting   something for symptom relief.         ED Vitals    Date/Time Temp Pulse Resp BP SpO2 Weight Who   07/20/17 1140 -- 64 -- 154/87 98 % -- NAR   07/20/17 1033 98.1 ????F (36.7 ????C) 70 12 163/60 100 % 117 lb (53.1 kg) SF          Allergies (Review Complete on: 07/20/17)     Agent Severity Comments    Bee Venom            Medical History     Past Medical History      Date Comments   Hypertension [I10]     Hyperlipidemia [E78.5]     Vitamin D deficiency [E55.9]            Surgical History       Obstetric History    No data available     `  ED Provider Notes     Patrcia Dolly, PA-C 07/20/2017 12:38 PM                      [facility] EMERGENCY DEPARTMENT  eMERGENCY dEPARTMENT eNCOUnter        Pt Name: Stephanie Suarez  MRN: [id number]  Birthdate [birth year]  Date of evaluation: 07/20/2017  Provider: Patrcia Dolly, PA-C  PCP: Tressia Miners Dorisann Schwanke    This patient was seen and evaluated by the attending physician Dr. Cherlynn Polo      CHIEF COMPLAINT       Chief  Complaint   Patient presents with   ??????? Flank Pain     pt diagnosed with kidney stone, kidney infeciton and bladder infection earlier in the week. followed up with urology and her PCP. her PCP stopped the pain medication and nausea medication and nausea/pain has returned. patient requesting something for   symptom relief.        HISTORY OF PRESENT ILLNESS  (Location/Symptom, Timing/Onset, Context/Setting, Quality, Duration, Modifying Factors, Severity) Note limiting factors.     Stephanie Suarez is a 84 y.o. female who presents complaining of left-sided flank pain with radiation to left upper abdomen for over one week.  She associates this with nausea.  On 07/11/17, she was seen in the emergency department for the same exact   presentation.  She was diagnosed with kidney stone and infection of kidney.  She was sent home with pain medicine, anti-emetic, and flomax.  She finished the course of these medications.  She did follow up with Dr. Paschal Dopp, urologist and her primary   care doctor.  She returns today with the same presentation.  She is laying comfortably in bed, currently rating her pain to be a 4 out of 10 on pain scale.  Denies chest pain, shortness of breath, cough, congestion, palpitations, hemoptysis, fever or   chills.  Denies hematuria, urinary frequency, urgency, diarrhea.  She reports some constipation but this is chronic.  Denies obstipation. Her PCP called her a few days ago after she gave her a urine sample. She was told that she had mild infection and   was placed on antibiotics which she has been taking for 3 or 4 days.     EXAMINATION:   CT OF THE ABDOMEN AND PELVIS WITH CONTRAST 07/11/2017 4:23 am   ????   TECHNIQUE:   CT of the abdomen and pelvis was performed with the administration of   intravenous contrast. Multiplanar reformatted images are provided for review.   Dose modulation, iterative reconstruction, and/or weight based adjustment of   the mA/kV was utilized to reduce the radiation dose to as  low as reasonably   achievable.   ????   COMPARISON:   None.   ????   HISTORY:   ORDERING SYSTEM PROVIDED HISTORY: n/v/flank pain   TECHNOLOGIST PROVIDED HISTORY:   IV contrast only. Thank you.   Ordering Physician Provided Reason for Exam: abdominal pain   Acuity: Unknown   Type of Exam: Unknown   Additional signs and symptoms: emesis   Relevant Medical/Surgical History: (left side flank pain that started 30   minutes ago. +nausea, +urinary frequency)   ????   FINDINGS:   Thorax base: ????Normal heart size with no pericardial effusion. ????Coronary and   descending thoracic aortic vascular calcifications. ????The lung bases   demonstrate mild atelectasis with no lobar consolidation or pleural effusion.   ????   Abdomen: ????Abdominal aortic atherosclerosis and mild ectasia with no aneurysm   formation. ????Within the liver there are several scattered subcentimeter cysts,   no follow-up imaging is recommended. ????The hepatic veins are patent. ????The   portal vasculature is patent. ????Mild intrahepatic biliary dilatation. ????The   gallbladder appears unremarkable. ????The common bile duct and pancreas are   normal. ????The spleen and adrenals are normal. ????The right kidney demonstrates   several subcentimeter cyst, no follow-up imaging is recommended. ????There are   multiple left renal cysts ranging from 0.8 through 5.3 cm, no follow-up   imaging is needed. ????There is mild right hydronephrosis with nonobstructing   calculi ranging from 2-3 mm. ????Mild perinephric stranding. ????The left ureter   demonstrates mild distention the mid to distal ureter is difficult to see due   to adjacent structures. ????In the pelvis there are multiple phleboliths. ????There   is a 2 mm calculus adjacent to the left UVJ.   ????   The stomach, small bowel, and appendix are normal. ????The colon is unremarkable.   ????   No ascites or free air. ????No abscess.   ????   Pelvis: ????The bladder demonstrates mild distention with no wall thickening or   intraluminal gas foci. ????The  uterus is normal. ????No significant ascites.   ????   Musculoskeletal structures: ????No diffuse body wall edema. ????No significant   inguinal lymphadenopathy. ????Normal lumbar spine and pelvic alignment with no   acute osseous abnormality.   ????   ????  Impression   1. The left kidney demonstrates nonobstructing nephrolithiasis and mild   hydronephrosis. ????There is a questionable 2-3 mm obstructing distal   ureterolith versus adjacent phlebolith.   2. No evidence of bowel obstruction, perforation, or abscess. ????The appendix   is normal.         Nursing Notes were all reviewed and agreed with or any disagreements were addressed  in the HPI.    REVIEW OF SYSTEMS   (2-9 systems for level 4, 10 or more for level 5)     Review of Systems   Constitutional: Negative for chills and fever.   HENT: Negative.    Eyes: Negative for visual disturbance.   Respiratory: Negative for cough, shortness of breath, wheezing and stridor.    Cardiovascular: Negative for chest pain, palpitations and leg swelling.   Gastrointestinal: Positive for constipation and nausea. Negative for abdominal distention, abdominal pain, anal bleeding, blood in stool, diarrhea, rectal pain and vomiting.   Endocrine: Negative.    Genitourinary: Positive for flank pain. Negative for difficulty urinating, dysuria, frequency, hematuria, pelvic pain, urgency, vaginal bleeding and vaginal discharge.   Musculoskeletal: Negative for back pain, neck pain and neck stiffness.   Skin: Negative for color change, pallor, rash and wound.   Allergic/Immunologic: Negative.    Neurological: Negative for dizziness, tremors, seizures, syncope, facial asymmetry, speech difficulty, weakness, light-headedness, numbness and headaches.   Hematological: Negative.    Psychiatric/Behavioral: Negative for confusion.   All other systems reviewed and are negative.      Positives and Pertinent negatives as per HPI.  Except as noted abovein the ROS, all other systems were reviewed and negative.        PAST MEDICAL HISTORY     Past Medical History:   Diagnosis Date   ??????? Hyperlipidemia    ??????? Hypertension    ??????? Vitamin D deficiency          SURGICAL HISTORY   History reviewed. No pertinent surgical history.      CURRENTMEDICATIONS       Previous Medications    ASCORBIC ACID (VITAMIN C) 250 MG TABLET    Take 250 mg by mouth daily    ATORVASTATIN (LIPITOR) 20 MG TABLET    Take 1 tablet by mouth daily    CALCIUM ASCORBATE 500 MG TABS    Take 500 mg by mouth    CALCIUM CARBONATE (OSCAL) 500 MG TABS TABLET    Take 500 mg by mouth daily    FERROUS SULFATE 325 (65 FE) MG TABLET    Take 325 mg by mouth daily (with breakfast)    LISINOPRIL (PRINIVIL;ZESTRIL) 20 MG TABLET    Take 1 tablet by mouth daily    NAPROXEN (NAPROSYN) 250 MG TABLET    Take 1 tablet by mouth 2 times daily (with meals) for 10 days    VITAMIN B-1 (THIAMINE) 100 MG TABLET    Take 100 mg by mouth daily    VITAMIN D (CHOLECALCIFEROL) 50000 UNIT CAPS    Take 1 capsule by mouth once a week    VITAMIN E 1000 UNITS CAPSULE    Take 1,000 Units by mouth daily         ALLERGIES     Bee venom    FAMILYHISTORY     History reviewed. No pertinent family history.       SOCIAL HISTORY       Social History     Socioeconomic History   ??????? Marital status: Divorced  Spouse name: None   ??????? Number of children: None   ??????? Years of education: None   ??????? Highest education level: None   Occupational History   ??????? None   Social Needs   ??????? Financial resource strain: None   ??????? Food insecurity:     Worry: None     Inability: None   ??????? Transportation needs:     Medical: None     Non-medical: None   Tobacco Use   ??????? Smoking status: Never Smoker   ??????? Smokeless tobacco: Never Used   Substance and Sexual Activity   ??????? Alcohol use: No   ??????? Drug use: Never   ??????? Sexual activity: None   Lifestyle   ??????? Physical activity:     Days per week: None     Minutes per session: None   ??????? Stress: None   Relationships   ??????? Social connections:     Talks on phone: None     Gets  together: None     Attends religious service: None     Active member of club or organization: None     Attends meetings of clubs or organizations: None     Relationship status: None   ??????? Intimate partner violence:     Fear of current or ex partner: None     Emotionally abused: None     Physically abused: None     Forced sexual activity: None   Other Topics Concern   ??????? None   Social History Narrative   ??????? None       SCREENINGS             PHYSICAL EXAM   (up to 7 for level 4, 8 or more for level 5)     ED Triage Vitals [07/20/17 1033]   BP Temp Temp Source Pulse Resp SpO2 Height Weight   (!) 163/60 98.1 ????F (36.7 ????C) Infrared 70 12 100 % 5' 1 (1.549 m) 117 lb (53.1 kg)       Physical Exam   Constitutional: She is oriented to person, place, and time. She appears well-developed and well-nourished. No distress.   HENT:   Head: Normocephalic and atraumatic.   Right Ear: External ear normal.   Left Ear: External ear normal.   Nose: Nose normal.   Mouth/Throat: Oropharynx is clear and moist.   Eyes: Pupils are equal, round, and reactive to light. Conjunctivae and EOM are normal. Right eye exhibits no discharge. Left eye exhibits no discharge. No scleral icterus.   Neck: Normal range of motion. No JVD present. No Brudzinski's sign and no Kernig's sign noted.   Cardiovascular: Normal rate.   Pulmonary/Chest: Effort normal and breath sounds normal.   Abdominal: Soft. Bowel sounds are normal. She exhibits no abdominal bruit and no pulsatile midline mass. There is tenderness (minimal to deep palpation) in the left upper quadrant. There is no rigidity, no rebound, no guarding, no CVA tenderness (left   minimally), no tenderness at McBurney's point and negative Murphy's sign.   Musculoskeletal: Normal range of motion.        Cervical back: Normal.        Thoracic back: She exhibits tenderness (left sided musculature worse with movement). She exhibits no bony tenderness, no swelling, no edema, no deformity and no laceration.         Lumbar back: Normal.   No midline vertebral tenderness or step-off deformity.   Negative straight leg raise.   No saddle paresthesia or  foot drop.   Lymphadenopathy:     She has no cervical adenopathy.   Neurological: She is alert and oriented to person, place, and time. She displays normal reflexes. No sensory deficit.   Skin: Skin is warm and dry. Capillary refill takes less than 2 seconds. No rash noted. She is not diaphoretic. No erythema. No pallor.   Psychiatric: She has a normal mood and affect. Her behavior is normal.   Nursing note and vitals reviewed.      DIAGNOSTIC RESULTS   LABS:    Labs Reviewed   CBC WITH AUTO DIFFERENTIAL - Abnormal; Notable for the following components:       Result Value    MCV 100.8 (*)     Lymphocytes # 0.9 (*)     All other components within normal limits    Narrative:     Performed at: [facility] [address]  [place], [place] [address]   Phone [phone]   COMPREHENSIVE METABOLIC PANEL - Abnormal; Notable for the following components:    Glucose 111 (*)     GFR Non-African American 59 (*)     Albumin/Globulin Ratio 1.0 (*)     All other components within normal limits    Narrative:     Performed at: [facility] [address]  [place], [place] [address]   Phone [phone]   URINE RT REFLEX TO CULTURE - Abnormal; Notable for the following components:    Ketones, Urine 15 (*)     Protein, UA TRACE (*)     Leukocyte Esterase, Urine MODERATE (*)     All other components within normal limits    Narrative:     Performed at: [facility] [address],  [place], [place] [address]   Phone [phone]   MICROSCOPIC URINALYSIS - Abnormal; Notable for the following components:    Bacteria, UA 1+ (*)     WBC, UA 37 (*)     All other components within normal limits    Narrative:     Performed at: [facility] [address]  [place], [place] [address]   Phone [phone]   URINE CULTURE   LIPASE    Narrative:     Performed at: [facility] [address]  [place], [place] [address]   Phone [phone]       All other labs were within normal range or not returned as of this dictation.    EKG: All EKG's are interpreted by the Emergency Department Physician who either signs orCo-signs this chart in the absence of a cardiologist.  Please see their note for interpretation of EKG.      RADIOLOGY:   Non-plain film images such as CT, Ultrasound and MRI are read by the radiologist. Plain radiographic images are visualized andpreliminarily interpreted by the  ED Provider with the below findings:        Interpretation perthe Radiologist below, if available at the time of this note:    CT ABDOMEN PELVIS WO CONTRAST Additional Contrast? None   Final Result   Mild left hydroureteronephrosis secondary to a 4 mm distal left ureteral   calculus.                 PROCEDURES   Unless otherwise noted below, none     Procedures    CRITICAL CARE TIME   N/A  CONSULTS:  IP CONSULT TO UROLOGY  I spoke with Dr> Alonna Buckler at 1230. He says that if patient has only been on the antibiotics for a few days, to go culture the urine and have  Her remain on this medicine. If her pain is well controlled, then she can go home and be sent home with medicine   for her discomfort and nausea and followup on Monday or Tuesday morning with a urologist in their group.     EMERGENCY DEPARTMENT COURSE and DIFFERENTIALDIAGNOSIS/MDM:   Vitals:    Vitals:    07/20/17 1033 07/20/17 1140   BP: (!) 163/60 (!) 154/87   Pulse: 70 64   Resp: 12    Temp: 98.1 ????F (36.7 ????C)    TempSrc: Infrared    SpO2: 100% 98%   Weight: 117 lb (53.1 kg)    Height: 5' 1 (1.549 m)        Patient was given thefollowing medications:  Medications - No data to display    This patient resents complaining of left flank pain and nausea.  She has no emesis throughout stay here.  Renal function preserved.  No white count or  fever at this time.  Urinalysis suggests mild infection but patient is currently on antibiotics and per   urologist has been instructed to remain on this medicine.  I will add Flomax, Zofran and pain medicine again to treat a distal left ureteral calculus with mild hydroureteronephrosis.  Given her mild discomfort, urologist feels that she can be sent home   with close follow-up in their office on Monday or Tuesday.  Patient and family understand and agree with plan.My suspicion is low for acute surgical abdomen, obstruction, perforation, abscess, mesenteric ischemia, AAA, dissection, cholecystitis,   cholangitis, pancreatitis, appendicitis, C. diff colitis, diverticulitis, volvulus, incarcerated hernia, necrotizing fasciitis, TOA, ovarian torsion, PID, ectopic pregnancy, fitz hugh Curtis syndrome,  Severe pyelonephritis, perinephric abscess,    urosepsis, fistula or other concerning pathology. We have addressed concerns and expectations.         FINAL IMPRESSION      1. Left ureteral calculus-with infection          DISPOSITION/PLAN   DISPOSITION Decision To Discharge 07/20/2017 12:29:35 PM      PATIENT REFERREDTO:  Ranelle Oyster  [address]  [facility]  [place] [address]  [phone]    In 3 days      Remo Lipps, MD  [address]  [address]  [place] [address]  [phone]      for urology follow up on monday      DISCHARGE MEDICATIONS:  New Prescriptions    HYDROCODONE-ACETAMINOPHEN (NORCO) 5-325 MG PER TABLET    Take 1 tablet by mouth every 6 hours as needed for Pain for up to 3 days.    ONDANSETRON (ZOFRAN) 4 MG TABLET    Take 1 tablet by mouth every 8 hours as needed for Nausea    TAMSULOSIN (FLOMAX) 0.4 MG CAPSULE    Take 1 capsule by mouth daily for 5 doses       DISCONTINUED MEDICATIONS:  Discontinued Medications    ONDANSETRON (ZOFRAN) 4 MG TABLET    Take 1 tablet by mouth 3 times daily as needed for Nausea or Vomiting              (Please note that portions ofthis note  were completed with a voice recognition program.  Efforts were made to edit the  dictations but occasionally words are mis-transcribed.)    Patrcia Dolly, PA-C (electronically signed)           Patrcia Dolly, PA-C  07/20/17 1238         Cosigned by Rueben Bash, DO at 07/20/2017  7:12 PM        Rueben Bash, DO 07/20/2017 12:06 PM           I independently evaluated and obtained a history and physical on Stephanie Suarez.    All diagnostic, treatment, and disposition assistants were made to myself in conjunction the advanced practice provider.    For further details of this patient's emergency department encounter, please see the advanced practice provider's documentation.    History: 84 year old female presents to the emergency room with complaint of continuing left flank pain.  Patient was seen on April 18 and noted to have a left-sided stone.  Thought to be 3 mm however CT was with contrast it difficult to fully evaluate.    She was discharged home with Zofran, hydrocodone and naproxen and told to follow up with urology.  She saw her primary care doctor a few days later.  She obtained a urinalysis.  She then went to see urology who evaluated her and discussed continuing to   monitor the symptoms.  She reports her primary care doctor called her back to the office and stated that she had possible urinary tract infection.  She was started on an unknown antibiotic and was told to stop taking all of the other medication she was   given in the emergency room.  She has only been taking Tylenol and antibiotic per her and the family member here.    Physician Exam: Constitutional: No acute distress, heart: Regular rate rhythm, no murmur, respiratory: Clear breath sounds bilaterally, abdomen: Mild tenderness to palpation in the left lower quadrant, no rebound, no guarding, no rigidity, negative   Eulah Pont, negative McBurney    MDM:  84 year old female presents with persistent left lower quadrant pain.  Vital signs  stable.  Physical exam as above.  She has no peritoneal signs.  She does have point tenderness in the left lower quadrant.  She appears well otherwise healthy.  She   appears nontoxic and her pain seems to be under control.  A CT scan of her abdomen and pelvis was performed that shows a distal 4 mm stone with mild hydronephrosis.  Also shows worsening WBCs in the urine.  We'll discuss with urology.        Rueben Bash, DO  07/20/17 1206             ED Medication Orders (From admission, onward)    None      Lab Results           CBC Auto Differential (Final result)  Result time 07/20/17 11:10:52    Collection Time Result Time WBC RBC HGB HCT MCV MCH MCHC RDW PLT MPV   07/20/17 11:03:00 07/20/17 11:10:00 7.8 4.03 13.3 40.7 100.8 33.0 32.7 12.6 181 9.2     Previous Results     07/11/17 03:40:00 07/11/17 03:59:00 7.3 3.93 13.2 40.4 102.9 33.7 32.7 13.2 124 9.6   06/20/16 05:24:00 06/20/16 05:31:00 4.4 3.72 12.4 37.8 101.4 33.3 32.9 13.3 131 9.2   06/18/13 15:40:00 06/19/13 02:51:00 5.1 3.77 12.8 38.8 102.9 33.9 32.9 13.4 97   13.0   06/02/13 11:24:00 06/02/13 11:45:00 6.2 3.93 13.4 40.2 102.3 34.0 33.3 13.4 157 9.3  09/06/12 13:05:00 09/06/12 14:22:00 4.7 3.62 12.0 36.1 99.8 33.3 33.3 12.6 143 8.8       Collection Time Result Time NEUTRO PCT LYMPHO PCT MONO PCT EOS BASOS PCT   NEUTRO ABS LYMPHS ABS MONOS ABS EOS ABS BASOS ABS   07/20/17 11:03:00 07/20/17 11:10:00 78.5 11.9 8.8 0.4 0.4 6.1 0.9 0.7 0.0 0.0     Previous Results   07/11/17 03:40:00 07/11/17 03:59:00 74.6 17.7 6.4 0.9 0.4 5.4 1.3 0.5 0.1 0.0   06/20/16 05:24:00   06/20/16 05:31:00             06/18/13 15:40:00 06/19/13 02:51:00 62.9 26.9 8.2 1.8 0.2 3.2 1.4 0.4 0.1 0.0   06/02/13 11:24:00 06/02/13 11:45:00             09/06/12 13:05:00 09/06/12 14:22:00 68.0 23.7 7.1 0.8 0.4 3.2 1.1 0.3 0.0 0.0       Collection   Time Result Time PLATELET SLIDE REVIEW   07/20/17 11:03:00 07/20/17 11:10:00      Previous Results   07/11/17 03:40:00 07/11/17 03:59:00     06/20/16 05:24:00 06/20/16 05:31:00    06/18/13 15:40:00 06/19/13 02:51:00 Decreased   06/02/13 11:24:00 06/02/13   11:45:00    09/06/12 13:05:00 09/06/12 14:22:00            Final result            Narrative:   Performed at: [facility] [address],  [place], [place] [address]   Phone [phone]                        Comprehensive Metabolic Panel (Final result)  Result time 07/20/17 11:28:53    Collection Time Result Time NA K CL CO2 ANION GAP GLUCOSE BUN CREATININE GFR Non-African American GFR African American   07/20/17 11:03:00 07/20/17 11:28:00 140 4.4 Specimen   hemolysis has exceeded the interference as defined by Roche. Value may be falsely increased. Suggest recollection if clinically indicated.  100 27 13 111 16 0.9 59 >60 mL/min/1.62m2 EGFR, calc. for ages 38 and older using the MDRD formula (not corrected   for weight), is valid for stable renal function.  >60 Chronic Kidney Disease: less than 60 ml/min/1.73 sq.m.         Kidney Failure: less than 15 ml/min/1.73 sq.m. Results valid for patients 18 years and older.      Previous Results   07/11/17 03:40:00   07/11/17 04:05:00 141 4.3 105 26 10 137 17 0.9 59>60 mL/min/1.28m2 EGFR, calc. for ages 24 and older using the MDRD formula (not corrected for weight), is valid for stable renal function.  >60Chronic Kidney Disease: less than 60 ml/min/1.73 sq.m.           Kidney Failure: less than 15 ml/min/1.73 sq.m. Results valid for patients 18 years and older.    06/18/13 15:40:00 06/19/13 00:31:00 143 4.4 105 25 13 91 16 1.0 53>60 mL/min/1.20m2 EGFR, calc. for ages 75 and older using the MDRD formula (not corrected   for weight), is valid for stable renal function.  >60Chronic Kidney Disease: less than 60 ml/min/1.73 sq.m.         Kidney Failure: less than 15 ml/min/1.73 sq.m. Results valid for patients 18 years and older.    06/02/13 11:24:00 06/02/13 12:02:00 143   4.2 103 27 13 94 15 0.9 60>60 mL/min/1.48m2 EGFR, calc. for  ages 14 and older using the MDRD formula (not corrected for weight), is  valid for stable renal function.  >60Chronic Kidney Disease: less than 60 ml/min/1.73 sq.m.         Kidney Failure: less   than 15 ml/min/1.73 sq.m. Results valid for patients 18 years and older.    09/06/12 13:05:00 09/06/12 14:38:00 141 3.8 105 30  94 16 0.9 >60>60 mL/min/1.63m2 EGFR, calc. for ages 5 and older using the MDRD formula (not corrected for weight), is valid   for stable renal function. >60Chronic Kidney Disease: less than 60 ml/min/1.73 sq.m.         Kidney Failure: less than 15 ml/min/1.73 sq.m. Results valid for patients 18 years and older.       Collection Time Result Time CALCIUM PROTEIN Alb   Albumin/Globulin Ratio BILI TOTAL ALK PHOS ALT AST Globulin   07/20/17 11:03:00 07/20/17 11:28:00 9.6 7.5 3.8 1.0 0.6 61 17 Specimen hemolysis has exceeded the interference as defined by Roche. Result may be affected. Suggest recollection if clinically   indicated.  29 Specimen hemolysis has exceeded the interference as defined by Roche. Value may be falsely increased. Suggest recollection if clinically indicated.  3.7     Previous Results   07/11/17 03:40:00 07/11/17 04:05:00 9.4 7.5 4.1 1.2 0.6 61 24   25 3.4   06/18/13 15:40:00 06/19/13 00:31:00 9.2 6.7 3.9 1.4 0.3 53 10 18Specimen hemolysis has exceeded the interference as defined by Roche. Value may be falsely increased. Suggest recollection if clinically indicated.  2.8   06/02/13 11:24:00 06/02/13   12:02:00 9.6 7.4 4.0 1.2 0.3 60 10 15 3.4   09/06/12 13:05:00 09/06/12 14:38:00 9.4                   Final result            Narrative:   Performed at: [facility] [address],  [place], [place] [address]   Phone [phone]                        Urinalysis Reflex to Culture (Final result)  Result time 07/20/17 11:16:52    Collection Time Result Time COLOR U CLARITY U GLUCOSE U BILI UR KETONES U SPEC GRAV BLOOD U PH, UR Protein, UA Urobilinogen,  Urine   07/20/17 11:03:00 07/20/17 11:16:00   YELLOW Clear Negative Negative 15 1.017 Negative 6.5 TRACE 1.0     Previous Results   07/11/17 04:05:00 07/11/17 04:17:00 RED CLOUDY Negative Negative Negative 1.016 LARGE 6.5 30 1.0   06/02/13 12:14:00 06/02/13 12:22:00 Yellow Clear Negative Negative   Negative 1.020 Negative 7.0 Negative 0.2   08/23/08 06:20:00 08/23/08 07:28:00 YELLOW CLEAR NEGATIVE NEGATIVE NEGATIVE >=1.030 NEGATIVE 5.5 NEGATIVE 0.2       Collection Time Result Time NITRITE LEUKOCYTES, UR Microscopic Examination Urine Reflex to   Culture Urine Type   07/20/17 11:03:00 07/20/17 11:16:00 Negative MODERATE YES Yes Not Specified     Previous Results   07/11/17 04:05:00 07/11/17 04:17:00 Negative SMALL YES Yes Not Specified   06/02/13 12:14:00 06/02/13 12:22:00 Negative Negative Not   Indicated  Not Specified   08/23/08 06:20:00 08/23/08 07:28:00 NEGATIVE TRACE              Final result            Narrative:   Performed at: [facility] [address]  [place], [place] [address]   Phone [phone]  Lipase (Final result)  Result time 07/20/17 11:28:53    Collection Time Result Time LIPASE   07/20/17 11:03:00 07/20/17 11:28:00 39.0     Previous Results   07/11/17 03:40:00 07/11/17 04:05:00 54.0   06/02/13 11:24:00 06/02/13 12:02:00 53.0             Final result            Narrative:   Performed at: [facility] [address]  [place], [place] [address]   Phone [phone]                        Microscopic Urinalysis (Final result)  Result time 07/20/17 12:07:40    Collection Time Result Time RBC  UA BACTERIA Hyaline Casts, UA WBC UA Epi Cells   07/20/17 11:03:00 07/20/17 12:06:00 0-2 1+ 7 37 2 Urinalysis microscopic performed using the   automated methodology (AUWI analyzer).      Previous Results   07/11/17 04:05:00 07/11/17 04:21:00 >900  2 4 1Urinalysis microscopic performed using the automated methodology (AUWI  analyzer).    08/23/08 06:20:00 08/23/08 07:28:00 Rare Rare  6-10 3-5             Final result            Narrative:   Performed at: [facility] [address]  [place], [place] [address]   Phone [phone]                    Imaging Results          CT ABDOMEN PELVIS WO CONTRAST Additional Contrast? None (Final result)  Result time 07/20/17 11:22:53   Final result by Sanjuana Kava, MD (07/20/17 11:22:53)            Impression:   Mild left hydroureteronephrosis secondary to a 4 mm distal left   ureteral calculus.          Narrative:   EXAMINATION: CT OF THE ABDOMEN AND PELVIS WITHOUT CONTRAST 07/20/2017 11:09 am  TECHNIQUE: CT of the abdomen and pelvis was performed without the administration of intravenous contrast. Multiplanar reformatted   images are provided for review. Dose modulation, iterative reconstruction, and/or weight based adjustment of the mA/kV was utilized to reduce the radiation dose to as low as reasonably achievable.  COMPARISON: 07/11/2017.  HISTORY: ORDERING SYSTEM   PROVIDED HISTORY: left flank pain/abdominal pain TECHNOLOGIST PROVIDED HISTORY: Additional Contrast?->None Ordering Physician Provided Reason for Exam: Flank Pain Acuity: Unknown Type of Exam: Unknown  FINDINGS: Lower Chest: There is no consolidation or   effusion.  Organs: Multiple hepatic and left renal cysts are again demonstrated with the largest in the left kidney measuring 5.8 cm.  No follow-up imaging is recommended.  There is mild left hydroureteronephrosis down to the level of the urinary bladder   were there is a 4 mm calcification lodged within the ureteral vesicular junction.  The remainder of the solid abdominal organs are unremarkable.Marland Kitchen  GI/Bowel: There is no bowel dilatation, wall thickening or obstruction  Pelvis: The pelvic organs and   urinary bladder are unremarkable.  Peritoneum/Retroperitoneum: There is no free air, free fluid or intraperitoneal inflammatory change.  There is no  adenopathy.  Bones/Soft Tissues: There is no fracture or aggressive osseous lesion.                     ED Administered Medications from 07/20/2017 1026 to 07/20/2017  [email or web address]     None      ED Treatment Team     Provider Role From To Phone Pager    Rueben Bash, DO Attending Provider 07/20/17 1050 07/20/17 1245 [phone]     Patrcia Dolly, PA-C Physician Assistant 07/20/17 [phone] -- [phone]         Code Onset/Outcome    None     Code Interventions/Drips/Airways    None       Diagnosis     Diagnosis Comment    Left ureteral calculus-with infection           ED Prescriptions     Medication Sig Dispense Start Date End Date Auth. Provider    ondansetron (ZOFRAN) 4 MG tablet Take 1 tablet by mouth every 8 hours as needed for Nausea 20 tablet 07/20/2017  Patrcia Dolly, PA-C    HYDROcodone-acetaminophen (NORCO) 5-325 MG per tablet Take 1 tablet by mouth every 6 hours as needed for Pain for up to 3 days. 10 tablet 07/20/2017 07/23/2017 Patrcia Dolly, PA-C    tamsulosin [facility]) 0.4 MG capsule Take 1 capsule by mouth daily for 5 doses 5 capsule 07/20/2017 07/25/2017 Patrcia Dolly, PA-C          Follow-up Information     Follow up With Specialties Details Why Contact Info    [facility] In 3 days  [address] [facility] [place] [address] [phone]     Remo Lipps, MD Urology  for urology follow up on monday [address] [place] [address] [phone]           Discharge Instructions    None

## 2017-07-20 NOTE — Unmapped (Signed)
Your patient was seen at a [facility]. Please go to [email or web address] to view information filed to your patient's chart in Epic.  If you need to view your patient's results prior to gaining access to   Epic CareLink, please contact the [facility] where your patient was seen.             Stephanie, Suarez #[id number] ([id number]) (84 y.o. F) PCP: Tobie Poet [phone])   21           ED Arrival Information     Expected Arrival Acuity Means of Arrival Escorted By Service Admission Type    - 07/20/2017 10:26 AM 3-Urgent Walk In Self Emergency Medicine Emergency      Arrival Complaint    ABD PAIN          Chief Complaint     Complaint Comment    Flank Pain pt diagnosed with kidney stone, kidney infeciton and bladder infection earlier in the week. followed up with urology and her PCP. her PCP stopped the pain medication and nausea medication and nausea/pain has returned. patient requesting   something for symptom relief.         ED Vitals    Date/Time Temp Pulse Resp BP SpO2 Weight Who   07/20/17 1140 -- 64 -- 154/87 98 % -- NAR   07/20/17 1033 98.1 ????F (36.7 ????C) 70 12 163/60 100 % 117 lb (53.1 kg) SF          Allergies (Review Complete on: 07/20/17)     Agent Severity Comments    Bee Venom            Medical History     Past Medical History      Date Comments   Hypertension [I10]     Hyperlipidemia [E78.5]     Vitamin D deficiency [E55.9]            Surgical History       Obstetric History    No data available     `  ED Provider Notes     Rueben Bash, DO 07/20/2017 12:06 PM           I independently evaluated and obtained a history and physical on Stephanie Suarez.    All diagnostic, treatment, and disposition assistants were made to myself in conjunction the advanced practice provider.    For further details of this patient's emergency department encounter, please see the advanced practice provider's documentation.    History: 84 year old female presents to the  emergency room with complaint of continuing left flank pain.  Patient was seen on April 18 and noted to have a left-sided stone.  Thought to be 3 mm however CT was with contrast it difficult to fully evaluate.    She was discharged home with Zofran, hydrocodone and naproxen and told to follow up with urology.  She saw her primary care doctor a few days later.  She obtained a urinalysis.  She then went to see urology who evaluated her and discussed continuing to   monitor the symptoms.  She reports her primary care doctor called her back to the office and stated that she had possible urinary tract infection.  She was started on an unknown antibiotic and was told to stop taking all of the other medication she was   given in the emergency room.  She has only been taking Tylenol and antibiotic per her and the family member here.    Physician Exam: Constitutional:  No acute distress, heart: Regular rate rhythm, no murmur, respiratory: Clear breath sounds bilaterally, abdomen: Mild tenderness to palpation in the left lower quadrant, no rebound, no guarding, no rigidity, negative   Eulah Pont, negative McBurney    MDM:  84 year old female presents with persistent left lower quadrant pain.  Vital signs stable.  Physical exam as above.  She has no peritoneal signs.  She does have point tenderness in the left lower quadrant.  She appears well otherwise healthy.  She   appears nontoxic and her pain seems to be under control.  A CT scan of her abdomen and pelvis was performed that shows a distal 4 mm stone with mild hydronephrosis.  Also shows worsening WBCs in the urine.  We'll discuss with urology.        Rueben Bash, DO  07/20/17 1206             ED Medication Orders (From admission, onward)    None      Lab Results           CBC Auto Differential (Final result)  Result time 07/20/17 11:10:52    Collection Time Result Time WBC RBC HGB HCT MCV MCH MCHC RDW PLT MPV   07/20/17 11:03:00 07/20/17 11:10:00 7.8 4.03 13.3 40.7 100.8  33.0 32.7 12.6 181 9.2     Previous Results     07/11/17 03:40:00 07/11/17 03:59:00 7.3 3.93 13.2 40.4 102.9 33.7 32.7 13.2 124 9.6   06/20/16 05:24:00 06/20/16 05:31:00 4.4 3.72 12.4 37.8 101.4 33.3 32.9 13.3 131 9.2   06/18/13 15:40:00 06/19/13 02:51:00 5.1 3.77 12.8 38.8 102.9 33.9 32.9 13.4 97   13.0   06/02/13 11:24:00 06/02/13 11:45:00 6.2 3.93 13.4 40.2 102.3 34.0 33.3 13.4 157 9.3   09/06/12 13:05:00 09/06/12 14:22:00 4.7 3.62 12.0 36.1 99.8 33.3 33.3 12.6 143 8.8       Collection Time Result Time NEUTRO PCT LYMPHO PCT MONO PCT EOS BASOS PCT   NEUTRO ABS LYMPHS ABS MONOS ABS EOS ABS BASOS ABS   07/20/17 11:03:00 07/20/17 11:10:00 78.5 11.9 8.8 0.4 0.4 6.1 0.9 0.7 0.0 0.0     Previous Results   07/11/17 03:40:00 07/11/17 03:59:00 74.6 17.7 6.4 0.9 0.4 5.4 1.3 0.5 0.1 0.0   06/20/16 05:24:00   06/20/16 05:31:00             06/18/13 15:40:00 06/19/13 02:51:00 62.9 26.9 8.2 1.8 0.2 3.2 1.4 0.4 0.1 0.0   06/02/13 11:24:00 06/02/13 11:45:00             09/06/12 13:05:00 09/06/12 14:22:00 68.0 23.7 7.1 0.8 0.4 3.2 1.1 0.3 0.0 0.0       Collection   Time Result Time PLATELET SLIDE REVIEW   07/20/17 11:03:00 07/20/17 11:10:00      Previous Results   07/11/17 03:40:00 07/11/17 03:59:00    06/20/16 05:24:00 06/20/16 05:31:00    06/18/13 15:40:00 06/19/13 02:51:00 Decreased   06/02/13 11:24:00 06/02/13   11:45:00    09/06/12 13:05:00 09/06/12 14:22:00            Final result            Narrative:   Performed at: [facility] [address]  [place], [place] [address]   Phone [phone]                        Comprehensive Metabolic Panel (Final result)  Result time 07/20/17 11:28:53    Collection  Time Result Time NA K CL CO2 ANION GAP GLUCOSE BUN CREATININE GFR Non-African American GFR African American   07/20/17 11:03:00 07/20/17 11:28:00 140 4.4 Specimen   hemolysis has exceeded the interference as defined by Roche. Value may be falsely increased. Suggest recollection if clinically  indicated.  100 27 13 111 16 0.9 59 >60 mL/min/1.73m2 EGFR, calc. for ages 42 and older using the MDRD formula (not corrected   for weight), is valid for stable renal function.  >60 Chronic Kidney Disease: less than 60 ml/min/1.73 sq.m.         Kidney Failure: less than 15 ml/min/1.73 sq.m. Results valid for patients 18 years and older.      Previous Results   07/11/17 03:40:00   07/11/17 04:05:00 141 4.3 105 26 10 137 17 0.9 59>60 mL/min/1.85m2 EGFR, calc. for ages 32 and older using the MDRD formula (not corrected for weight), is valid for stable renal function.  >60Chronic Kidney Disease: less than 60 ml/min/1.73 sq.m.           Kidney Failure: less than 15 ml/min/1.73 sq.m. Results valid for patients 18 years and older.    06/18/13 15:40:00 06/19/13 00:31:00 143 4.4 105 25 13 91 16 1.0 53>60 mL/min/1.2m2 EGFR, calc. for ages 28 and older using the MDRD formula (not corrected   for weight), is valid for stable renal function.  >60Chronic Kidney Disease: less than 60 ml/min/1.73 sq.m.         Kidney Failure: less than 15 ml/min/1.73 sq.m. Results valid for patients 18 years and older.    06/02/13 11:24:00 06/02/13 12:02:00 143   4.2 103 27 13 94 15 0.9 60>60 mL/min/1.65m2 EGFR, calc. for ages 13 and older using the MDRD formula (not corrected for weight), is valid for stable renal function.  >60Chronic Kidney Disease: less than 60 ml/min/1.73 sq.m.         Kidney Failure: less   than 15 ml/min/1.73 sq.m. Results valid for patients 18 years and older.    09/06/12 13:05:00 09/06/12 14:38:00 141 3.8 105 30  94 16 0.9 >60>60 mL/min/1.39m2 EGFR, calc. for ages 63 and older using the MDRD formula (not corrected for weight), is valid   for stable renal function. >60Chronic Kidney Disease: less than 60 ml/min/1.73 sq.m.         Kidney Failure: less than 15 ml/min/1.73 sq.m. Results valid for patients 18 years and older.       Collection Time Result Time CALCIUM PROTEIN Alb   Albumin/Globulin Ratio BILI TOTAL ALK PHOS ALT  AST Globulin   07/20/17 11:03:00 07/20/17 11:28:00 9.6 7.5 3.8 1.0 0.6 61 17 Specimen hemolysis has exceeded the interference as defined by Roche. Result may be affected. Suggest recollection if clinically   indicated.  29 Specimen hemolysis has exceeded the interference as defined by Roche. Value may be falsely increased. Suggest recollection if clinically indicated.  3.7     Previous Results   07/11/17 03:40:00 07/11/17 04:05:00 9.4 7.5 4.1 1.2 0.6 61 24   25 3.4   06/18/13 15:40:00 06/19/13 00:31:00 9.2 6.7 3.9 1.4 0.3 53 10 18Specimen hemolysis has exceeded the interference as defined by Roche. Value may be falsely increased. Suggest recollection if clinically indicated.  2.8   06/02/13 11:24:00 06/02/13   12:02:00 9.6 7.4 4.0 1.2 0.3 60 10 15 3.4   09/06/12 13:05:00 09/06/12 14:38:00 9.4                   Final result  Narrative:   Performed at: [facility] [address],  [place], [place] [address]   Phone [phone]                        Urinalysis Reflex to Culture (Final result)  Result time 07/20/17 11:16:52    Collection Time Result Time COLOR U CLARITY U GLUCOSE U BILI UR KETONES U SPEC GRAV BLOOD U PH, UR Protein, UA Urobilinogen, Urine   07/20/17 11:03:00 07/20/17 11:16:00   YELLOW Clear Negative Negative 15 1.017 Negative 6.5 TRACE 1.0     Previous Results   07/11/17 04:05:00 07/11/17 04:17:00 RED CLOUDY Negative Negative Negative 1.016 LARGE 6.5 30 1.0   06/02/13 12:14:00 06/02/13 12:22:00 Yellow Clear Negative Negative   Negative 1.020 Negative 7.0 Negative 0.2   08/23/08 06:20:00 08/23/08 07:28:00 YELLOW CLEAR NEGATIVE NEGATIVE NEGATIVE >=1.030 NEGATIVE 5.5 NEGATIVE 0.2       Collection Time Result Time NITRITE LEUKOCYTES, UR Microscopic Examination Urine Reflex to   Culture Urine Type   07/20/17 11:03:00 07/20/17 11:16:00 Negative MODERATE YES Yes Not Specified     Previous Results   07/11/17 04:05:00 07/11/17 04:17:00 Negative SMALL YES Yes Not  Specified   06/02/13 12:14:00 06/02/13 12:22:00 Negative Negative Not   Indicated  Not Specified   08/23/08 06:20:00 08/23/08 07:28:00 NEGATIVE TRACE              Final result            Narrative:   Performed at: [facility] [address]  [place], [place] [address]   Phone [phone]                         Lipase (Final result)  Result time 07/20/17 11:28:53    Collection Time Result Time LIPASE   07/20/17 11:03:00 07/20/17 11:28:00 39.0     Previous Results   07/11/17 03:40:00 07/11/17 04:05:00 54.0   06/02/13 11:24:00 06/02/13 12:02:00 53.0             Final result            Narrative:   Performed at: [facility] [address]  [place], [place] [address]   Phone [phone]                        Microscopic Urinalysis (Preliminary result)  Result time 07/20/17 11:55:52    Collection Time Result Time Hyaline Casts, UA WBC UA Epi Cells   07/20/17 11:03:00 07/20/17 11:20:00 7 37 2 Urinalysis microscopic performed using the automated methodology   (AUWI analyzer).      Previous Results   07/11/17 04:05:00 07/11/17 04:21:00 2 4 1Urinalysis microscopic performed using the automated methodology (AUWI analyzer).    08/23/08 06:20:00 08/23/08 07:28:00  6-10 3-5           Preliminary result              Narrative:   Performed at: [facility] [address],  [place], [place] [address]   Phone [phone]                    Imaging Results          CT ABDOMEN PELVIS WO CONTRAST Additional Contrast? None (Final result)  Result time 07/20/17 11:22:53   Final result by Lorin Picket  Asencion Islam, MD (07/20/17 11:22:53)            Impression:   Mild left hydroureteronephrosis secondary to a 4 mm distal left   ureteral calculus.          Narrative:   EXAMINATION: CT OF THE ABDOMEN AND PELVIS WITHOUT CONTRAST 07/20/2017 11:09 am  TECHNIQUE: CT of the abdomen and pelvis was performed without the administration of intravenous contrast. Multiplanar  reformatted   images are provided for review. Dose modulation, iterative reconstruction, and/or weight based adjustment of the mA/kV was utilized to reduce the radiation dose to as low as reasonably achievable.  COMPARISON: 07/11/2017.  HISTORY: ORDERING SYSTEM   PROVIDED HISTORY: left flank pain/abdominal pain TECHNOLOGIST PROVIDED HISTORY: Additional Contrast?->None Ordering Physician Provided Reason for Exam: Flank Pain Acuity: Unknown Type of Exam: Unknown  FINDINGS: Lower Chest: There is no consolidation or   effusion.  Organs: Multiple hepatic and left renal cysts are again demonstrated with the largest in the left kidney measuring 5.8 cm.  No follow-up imaging is recommended.  There is mild left hydroureteronephrosis down to the level of the urinary bladder   were there is a 4 mm calcification lodged within the ureteral vesicular junction.  The remainder of the solid abdominal organs are unremarkable.Marland Kitchen  GI/Bowel: There is no bowel dilatation, wall thickening or obstruction  Pelvis: The pelvic organs and   urinary bladder are unremarkable.  Peritoneum/Retroperitoneum: There is no free air, free fluid or intraperitoneal inflammatory change.  There is no adenopathy.  Bones/Soft Tissues: There is no fracture or aggressive osseous lesion.                     ED Administered Medications from 07/20/2017 1026 to 07/20/2017 1206     None      ED Treatment Team     Provider Role From To Phone Pager    Rueben Bash, DO Attending Provider 07/20/17 1050 -- [phone]     Patrcia Dolly, PA-C Physician Assistant 07/20/17 [phone] -- [phone]         Code Onset/Outcome    None     Code Interventions/Drips/Airways    None       Diagnosis     Diagnosis Comment    Left ureteral calculus-with infection           ED Prescriptions     None        Follow-up Information    None       Discharge Instructions    None

## 2017-07-21 LAB — CULTURE, URINE: Urine Culture, Routine: <10000

## 2017-08-15 ENCOUNTER — Emergency Department: Admit: 2017-08-15 | Payer: MEDICARE | Primary: Nurse Practitioner

## 2017-08-15 ENCOUNTER — Inpatient Hospital Stay
Admit: 2017-08-15 | Discharge: 2017-08-15 | Disposition: A | Discharge status: Home / Self Care | Payer: MEDICARE | Attending: Emergency Medicine

## 2017-08-15 DIAGNOSIS — R51 Headache: Secondary | ICD-10-CM

## 2017-08-15 LAB — ANTIBODY SCREEN: Antibody Screen: NEGATIVE

## 2017-08-15 LAB — URINALYSIS
Bilirubin Urine: NEGATIVE
Blood, Urine: NEGATIVE
Glucose, Ur: NEGATIVE mg/dL
Ketones, Urine: NEGATIVE mg/dL
Nitrite, Urine: NEGATIVE
Protein, UA: NEGATIVE mg/dL
Specific Gravity, UA: 1.023 (ref 1.005–1.030)
Urobilinogen, Urine: 0.2 E.U./dL (ref <2.0)
pH, UA: 6 (ref 5.0–8.0)

## 2017-08-15 LAB — CBC WITH AUTO DIFFERENTIAL
Basophils %: 0.5 %
Basophils Absolute: 0 10*3/uL (ref 0.0–0.2)
Eosinophils %: 1 %
Eosinophils Absolute: 0.1 10*3/uL (ref 0.0–0.6)
Hematocrit: 38 % (ref 36.0–48.0)
Hemoglobin: 12.7 g/dL (ref 12.0–16.0)
Lymphocytes %: 13.7 %
Lymphocytes Absolute: 0.9 10*3/uL — ABNORMAL LOW (ref 1.0–5.1)
MCH: 34.1 pg — ABNORMAL HIGH (ref 26.0–34.0)
MCHC: 33.3 g/dL (ref 31.0–36.0)
MCV: 102.2 fL — ABNORMAL HIGH (ref 80.0–100.0)
MPV: 10.2 fL (ref 5.0–10.5)
Monocytes %: 5.5 %
Monocytes Absolute: 0.4 10*3/uL (ref 0.0–1.3)
Neutrophils %: 79.3 %
Neutrophils Absolute: 5.5 10*3/uL (ref 1.7–7.7)
Platelets: 125 10*3/uL — ABNORMAL LOW (ref 135–450)
RBC: 3.71 M/uL — ABNORMAL LOW (ref 4.00–5.20)
RDW: 13.4 % (ref 12.4–15.4)
WBC: 6.9 10*3/uL (ref 4.0–11.0)

## 2017-08-15 LAB — TYPE AND SCREEN: ABO/Rh: B NEG

## 2017-08-15 LAB — BASIC METABOLIC PANEL
Anion Gap: 11 (ref 3–16)
BUN: 13 mg/dL (ref 7–20)
CO2: 27 mmol/L (ref 21–32)
Calcium: 10 mg/dL (ref 8.3–10.6)
Chloride: 104 mmol/L (ref 99–110)
Creatinine: 0.9 mg/dL (ref 0.6–1.2)
GFR African American: >60 (ref >60)
GFR Non-African American: 59 — AB (ref >60)
Glucose: 124 mg/dL — ABNORMAL HIGH (ref 70–99)
Potassium: 5.1 mmol/L (ref 3.5–5.1)
Sodium: 142 mmol/L (ref 136–145)

## 2017-08-15 LAB — MICROSCOPIC URINALYSIS
Epithelial Cells, UA: 1 /HPF (ref 0–5)
Hyaline Casts, UA: 2 /LPF (ref 0–8)
RBC, UA: 3 /HPF (ref 0–4)
WBC, UA: 5 /HPF (ref 0–5)

## 2017-08-15 MED ORDER — ACETAMINOPHEN 500 MG PO TABS
500 MG | Freq: Once | ORAL | Status: AC
Start: 2017-08-15 — End: 2017-08-15
  Administered 2017-08-15: 19:00:00 1000 mg via ORAL

## 2017-08-15 MED ORDER — MECLIZINE HCL 25 MG PO TABS
25 | ORAL_TABLET | Freq: Three times a day (TID) | ORAL | 0 refills | Status: AC | PRN
Start: 2017-08-15 — End: 2017-08-25

## 2017-08-15 MED ORDER — MECLIZINE HCL 12.5 MG PO TABS
12.5 MG | Freq: Once | ORAL | Status: AC
Start: 2017-08-15 — End: 2017-08-15
  Administered 2017-08-15: 21:00:00 25 mg via ORAL

## 2017-08-15 MED FILL — ACETAMINOPHEN EXTRA STRENGTH 500 MG PO TABS: 500 mg | ORAL | Qty: 2

## 2017-08-15 MED FILL — MECLIZINE HCL 12.5 MG PO TABS: 12.5 mg | ORAL | Qty: 2

## 2017-08-15 NOTE — ED Provider Notes (Signed)
I independently performed a history and physical on Amber Palmer.   All diagnostic, treatment, and disposition decisions were made by myself in conjunction with the advanced practice provider.     Briefly, this is a 84 y.o. female here for fatigue, dizziness she describes a sense of spinning, and a very mild frontal headache. This has been present since Tuesday. She feels very tired and is not sure what is causing this.  She denies chest pain, shortness breath, fever, chills, and other symptoms. She has not had lightheadedness or falling.     On exam, Alert and oriented to person, place, time, and situation. CN II-XII intact as tested. Strength 5/5 in upper and lower extremities bilaterally. Normal reflexes. No pronator drift. Normal sensation to light touch. Normal finger-nose bilaterally. Normal heel-shin bilaterally. Gait steady and without difficulty. No speech difficulties or slurring. With movement or standing, patient has horizontal nystagmus that fatigues with repeated movement. Heart is RRR. Lungs are CTAB. No visual field deficits as tested by confrontation in each eye individually.    MDM  The patient's symptoms may be secondary to peripheral vertigo.  I was able to get her nystagmus to fatigue with repeated movements.  Her neurologic exam is otherwise completely normal with no focal abnormalities, so there is low risk of stroke or vertebrobasilar insufficiency. Her head CT is normal, without evidence of hemorrhage or mass lesion. After receiving meclizine, the patient's symptoms completely resolved. She was able to stand and walk steadily without recurrence of symptoms.    I estimate there is LOW risk for INTRACRANIAL HEMORRHAGE,SUBARACHNOID HEMORRHAGE, SUBDURAL HEMATOMA, STROKE, TIA, MENINGITIS, ENCEPHALITIS, SPINAL CORD INJURY/COMPRESSION/LESION, VERTEBROBASILAR INSUFFICIENCY, thus I consider the discharge disposition reasonable. Amber Palmer and I have discussed the diagnosis and risks, and we agree with  discharging home to follow-up with their primary doctor. We also discussed returning to the Emergency Department immediately if new or worsening symptoms occur. We have discussed the symptoms which are most concerning (e.g., numbness, tingling, weakness, facial droop, speech changes) that necessitate immediate return.       Patient Referrals:  [facility] Emergency Department  [address]  [place] [address]  [phone]    As needed, If symptoms worsen or new symptoms develop    Debarah Crape, APRN - NP  [address]  [place] [address]  [phone]    In 3 days  for re-evaluation      Discharge Medications:  New Prescriptions    MECLIZINE (ANTIVERT) 25 MG TABLET    Take 1 tablet by mouth 3 times daily as needed for Dizziness       FINAL IMPRESSION  1. Vertigo    2. Acute nonintractable headache, unspecified headache type    3. Dizziness    4. Fatigue, unspecified type        Blood pressure (!) 145/55, pulse 69, temperature 98.1 ??F (36.7 ??C), temperature source Oral, resp. rate 15, height 5' (1.524 m), weight 116 lb (52.6 kg), SpO2 97 %.     For further details of [facility] emergency department encounter, please see documentation by advanced practice provider, Orvilla Fus, CNP.        Herminio Commons, MD  08/15/17 1725

## 2017-08-15 NOTE — ED Provider Notes (Signed)
[facility] EMERGENCY DEPARTMENT  EMERGENCY DEPARTMENT ENCOUNTER        Pt Name: Amber Palmer  MRN: [id number]  Birthdate Apr 21, 1929  Date of evaluation: 08/15/2017  Provider: Cardell Peach, APRN - CNP  PCP: Debarah Crape, NP, APRN - NP      CHIEF COMPLAINT       Chief Complaint   Patient presents with   ??? Fatigue     Pt reports feeling fatigued, onset yesterday, pt is concerned about anemia.         HISTORY OF PRESENT ILLNESS   (Location/Symptom, Timing/Onset,Context/Setting, Quality, Duration, Modifying Factors, Severity)  Note limiting factors.     Amber Palmer is a 84 y.o. female who presents to ER with concern for generalized weakness present since Tuesday.  She also has a slight headache at her forehead that is 2/10.  She hasn't tried any remedies for symptoms.  She reports that she feels tired and is concerned she may be anemic.  She denies fever, rash, dizziness, chest pain, shortness of breath, cough, congestion, abdominal pain, nausea, vomiting, diarrhea, constipation, blood in the stool, or painful urination.  One friend/family member at bedside.       Nursing Notes triage note reviewed and agreed with or any disagreements were addressed  in the HPI.    REVIEW OF SYSTEMS    (2-9 systems for level 4, 10 or more for level 5)     Review of Systems   Constitutional: Positive for fatigue. Negative for chills and fever.   HENT: Negative for postnasal drip, rhinorrhea and sore throat.    Eyes: Negative for visual disturbance.   Respiratory: Negative for shortness of breath.    Cardiovascular: Negative for chest pain.   Gastrointestinal: Negative for abdominal pain, blood in stool, constipation, diarrhea, nausea and vomiting.   Genitourinary: Negative for dysuria, flank pain and hematuria.   Skin: Negative for rash.   Neurological: Negative for weakness and headaches.   All other systems reviewed and are negative.      Positives and Pertinent negatives as per HPI.  Except as noted above in  the ROS, all other systems were reviewed and negative.       PAST MEDICAL HISTORY     Past Medical History:   Diagnosis Date   ??? Hyperlipidemia    ??? Hypertension    ??? Vitamin D deficiency          SURGICAL HISTORY     History reviewed. No pertinent surgical history.      CURRENT MEDICATIONS       Previous Medications    ASCORBIC ACID (VITAMIN C) 250 MG TABLET    Take 250 mg by mouth daily    ATORVASTATIN (LIPITOR) 20 MG TABLET    Take 1 tablet by mouth daily    CALCIUM ASCORBATE 500 MG TABS    Take 500 mg by mouth    CALCIUM CARBONATE (OSCAL) 500 MG TABS TABLET    Take 500 mg by mouth daily    LISINOPRIL (PRINIVIL;ZESTRIL) 20 MG TABLET    Take 1 tablet by mouth daily    NAPROXEN (NAPROSYN) 250 MG TABLET    Take 1 tablet by mouth 2 times daily (with meals) for 10 days    TAMSULOSIN (FLOMAX) 0.4 MG CAPSULE    Take 1 capsule by mouth daily for 5 doses    VITAMIN B-1 (THIAMINE) 100 MG TABLET    Take 100 mg by mouth daily    VITAMIN  D (CHOLECALCIFEROL) 50000 UNIT CAPS    Take 1 capsule by mouth once a week    VITAMIN E 1000 UNITS CAPSULE    Take 1,000 Units by mouth daily         ALLERGIES     Bee venom    FAMILY HISTORY     History reviewed. No pertinent family history.       SOCIAL HISTORY       Social History     Socioeconomic History   ??? Marital status: Divorced     Spouse name: None   ??? Number of children: None   ??? Years of education: None   ??? Highest education level: None   Occupational History   ??? None   Social Needs   ??? Financial resource strain: None   ??? Food insecurity:     Worry: None     Inability: None   ??? Transportation needs:     Medical: None     Non-medical: None   Tobacco Use   ??? Smoking status: Never Smoker   ??? Smokeless tobacco: Never Used   Substance and Sexual Activity   ??? Alcohol use: No   ??? Drug use: Never   ??? Sexual activity: None   Lifestyle   ??? Physical activity:     Days per week: None     Minutes per session: None   ??? Stress: None   Relationships   ??? Social connections:     Talks on phone:  None     Gets together: None     Attends religious service: None     Active member of club or organization: None     Attends meetings of clubs or organizations: None     Relationship status: None   ??? Intimate partner violence:     Fear of current or ex partner: None     Emotionally abused: None     Physically abused: None     Forced sexual activity: None   Other Topics Concern   ??? None   Social History Narrative   ??? None       SCREENINGS             PHYSICAL EXAM  (up to 7 for level 4, 8 or more for level 5)     ED Triage Vitals [08/15/17 1326]   BP Temp Temp Source Pulse Resp SpO2 Height Weight   127/85 98.1 ??F (36.7 ??C) Oral 70 15 98 % 5' (1.524 m) 116 lb (52.6 kg)       Physical Exam   Constitutional: She is oriented to person, place, and time. She appears well-developed and well-nourished. No distress.   HENT:   Head: Normocephalic and atraumatic.   Eyes: Pupils are equal, round, and reactive to light. EOM are normal. Right eye exhibits no discharge. Left eye exhibits no discharge. No scleral icterus. Right eye exhibits normal extraocular motion and no nystagmus. Left eye exhibits normal extraocular motion and no nystagmus. Right pupil is round and reactive. Left pupil is round and reactive. Pupils are equal.   Neck: Normal range of motion and full passive range of motion without pain. Neck supple. No neck rigidity. Normal range of motion present. No Brudzinski's sign and no Kernig's sign noted.   Cardiovascular: Normal rate, regular rhythm, normal heart sounds and intact distal pulses. Exam reveals no gallop and no friction rub.   No murmur heard.  Pulmonary/Chest: Effort normal and breath sounds normal. No stridor. No  respiratory distress. She has no wheezes. She has no rales. She exhibits no tenderness.   Abdominal: Soft. Bowel sounds are normal. She exhibits no distension and no mass. There is no tenderness. There is no rebound and no guarding.   Musculoskeletal: Normal range of motion. She exhibits no edema  or tenderness.        Right hand: Normal sensation noted. Normal strength noted.        Left hand: Normal sensation noted. Normal strength noted.   Neurological: She is alert and oriented to person, place, and time. She has normal strength and normal reflexes. She is not disoriented. She displays normal reflexes. No cranial nerve deficit or sensory deficit. She exhibits normal muscle tone. Coordination normal. GCS eye subscore is 4. GCS verbal subscore is 5. GCS motor subscore is 6.   Reflex Scores:       Patellar reflexes are 2+ on the right side and 2+ on the left side.       Achilles reflexes are 2+ on the right side and 2+ on the left side.  Skin: Skin is warm and dry. She is not diaphoretic. No pallor.   Psychiatric: She has a normal mood and affect. Her behavior is normal.   Nursing note and vitals reviewed.      DIAGNOSTIC RESULTS   LABS:    Labs Reviewed   CBC WITH AUTO DIFFERENTIAL - Abnormal; Notable for the following components:       Result Value    RBC 3.71 (*)     MCV 102.2 (*)     MCH 34.1 (*)     Platelets 125 (*)     Lymphocytes # 0.9 (*)     All other components within normal limits    Narrative:     Performed at:  [facility]  [address]  [place], [place] [address]   Phone [phone]   BASIC METABOLIC PANEL - Abnormal; Notable for the following components:    Glucose 124 (*)     GFR Non-African American 59 (*)     All other components within normal limits    Narrative:     Performed at:  [facility]  [address]  [place], [place] [address]   Phone [phone]   URINALYSIS - Abnormal; Notable for the following components:    Leukocyte Esterase, Urine SMALL (*)     All other components within normal limits    Narrative:     Performed at:  [facility]  [address]  [place], [place] [address]   Phone [phone]   URINE CULTURE   MICROSCOPIC URINALYSIS    Narrative:     Performed at:  [facility]  [address],  [place], [place] [address]   Phone [phone]   TYPE AND SCREEN    Narrative:     Performed at:  [facility]  [address]  [place], [place] [address]   Phone [phone]   ANTIBODY SCREEN       All other labs werewithin normal range or not returned as of this dictation.    EKG: All EKG's are interpreted by the Emergency Department Physician who either signs or Co-signs this chart in the absence of acardiologist.  Please see their note for interpretation of EKG.      RADIOLOGY:   Interpretation per the Radiologist below, if  available at the time of this note:    CT Head WO Contrast    (Results Pending)     No results found.      PROCEDURES   Unless otherwise noted below, none     Procedures    CRITICAL CARE TIME     n/a    CONSULTS:  None      EMERGENCY DEPARTMENT COURSE and DIFFERENTIAL DIAGNOSIS/MDM:   Vitals:    Vitals:    08/15/17 1326 08/15/17 1408 08/15/17 1430 08/15/17 1500   BP: 127/85 (!) 159/65 139/62 136/60   Pulse: 70 68  68   Resp: 15      Temp: 98.1 ??F (36.7 ??C)      TempSrc: Oral      SpO2: 98% 99% 100% 98%   Weight: 116 lb (52.6 kg)      Height: 5' (1.524 m)          [name] was given the following medications:  Medications   acetaminophen (TYLENOL) tablet 1,000 mg (1,000 mg Oral Given 08/15/17 1440)       [name] was evaluated in the emergency department with concern for headache.  Treated with Tylenol.  Laboratory evaluation performed.  No anemia present.  CT results are pending.    As it is the end of my shift, my attending will follow up on these results and disposition the patient.  Please see attending note for further MDM, consults, and disposition.    The patient tolerated their visit well.  They were seen and evaluated by the attending physician, Herminio Commons, MD , who agreed with the assessment and plan.  The patient and / or the family were informed of the results of any tests, a time was given to answer  questions, a plan was proposed and they agreed with plan.        FINAL IMPRESSION      1. Acute nonintractable headache, unspecified headache type          DISPOSITION/PLAN   DISPOSITION          (Please note that portions of this note were completed with a voice recognition program.  Efforts were made to edit the dictations but occasionally words are mis-transcribed.)    Cardell Peach, APRN - CNP (electronically signed)        Cardell Peach, APRN - CNP  08/15/17 1514

## 2017-08-16 LAB — CULTURE, URINE: Urine Culture, Routine: NO GROWTH

## 2018-10-04 IMAGING — DX DG CHEST 2V
2 series · 2 of 2 positions shown · non-contrast
Comparison: None.

CLINICAL DATA: Chest pain and hypertension

EXAM:
CHEST  2 VIEW

[chest pa]
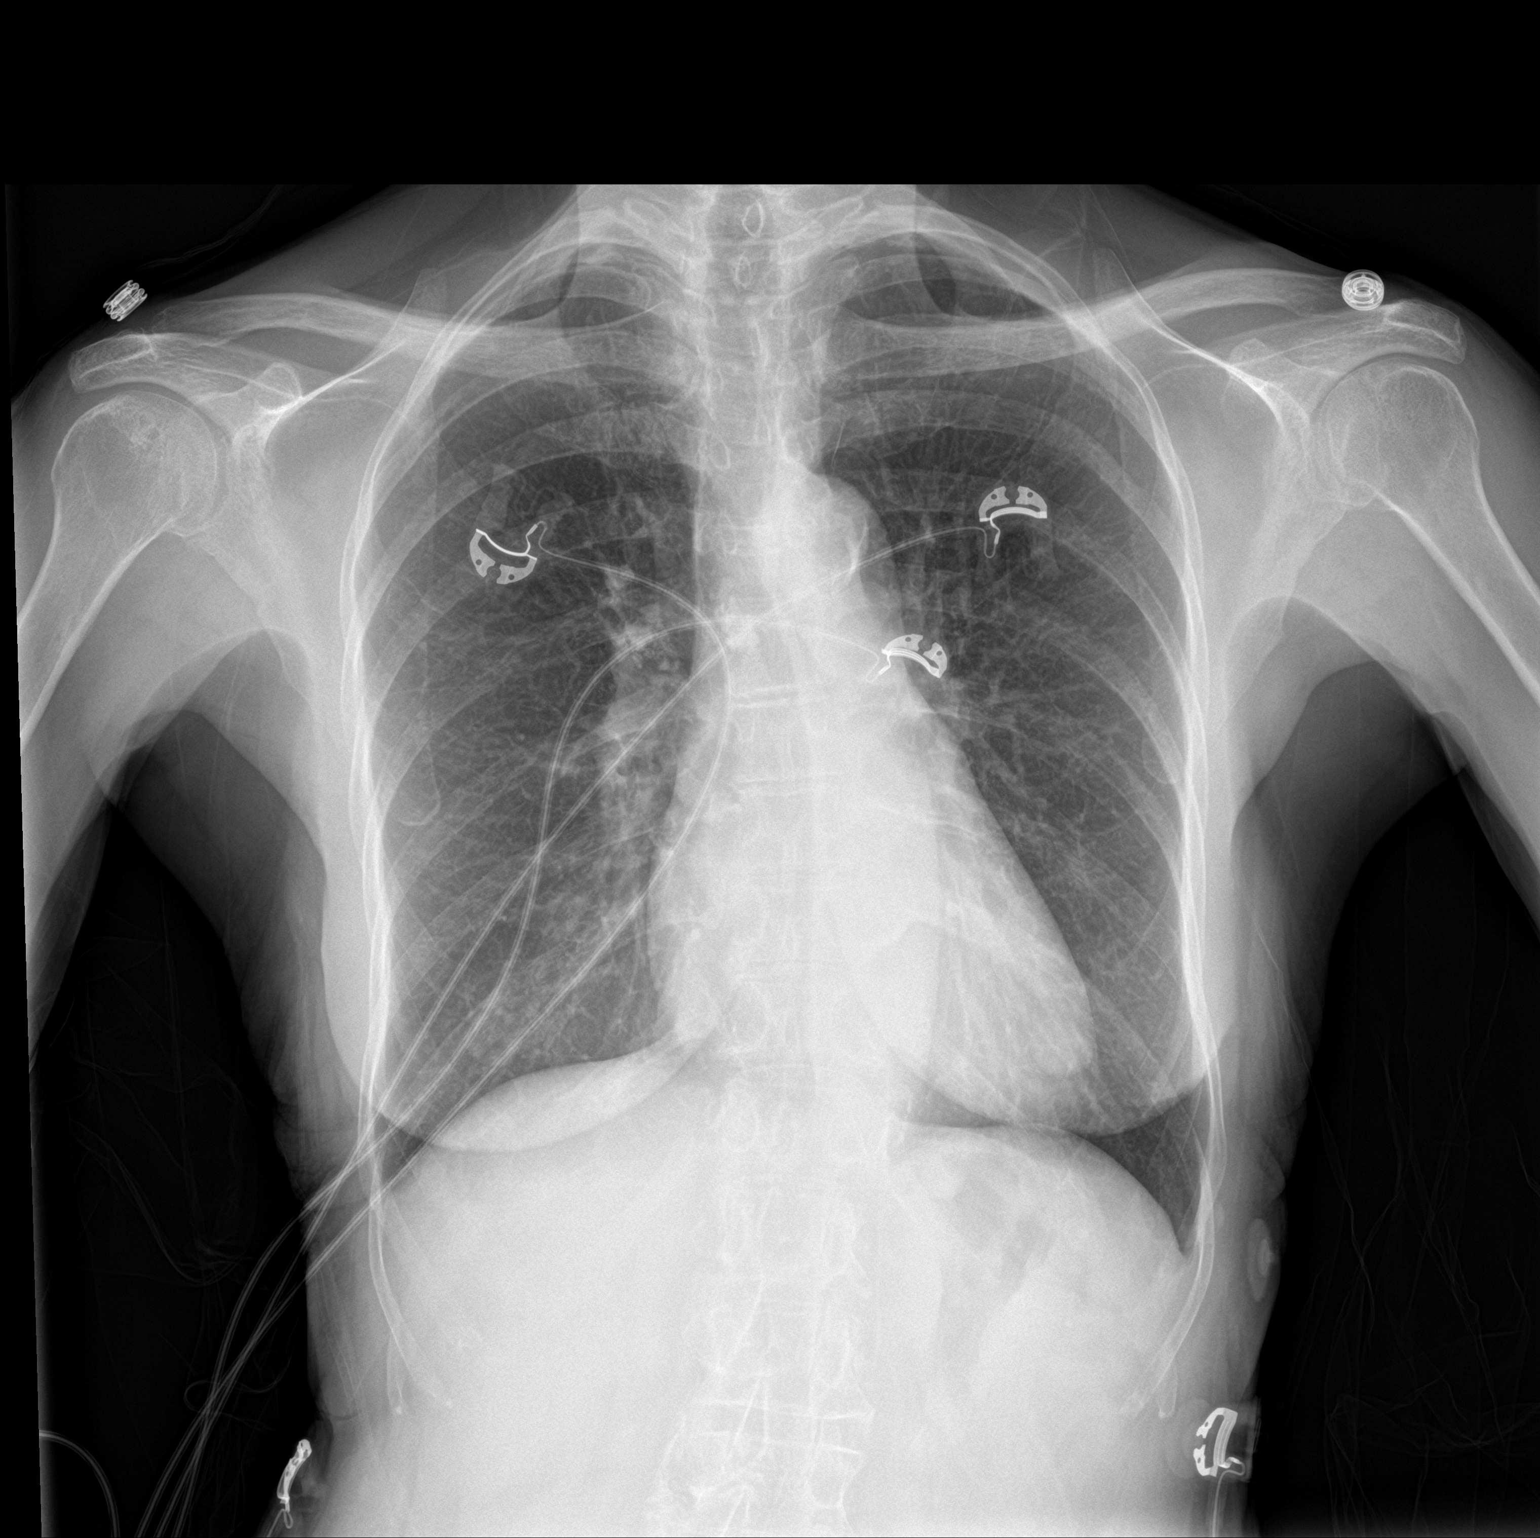

[chest lat]
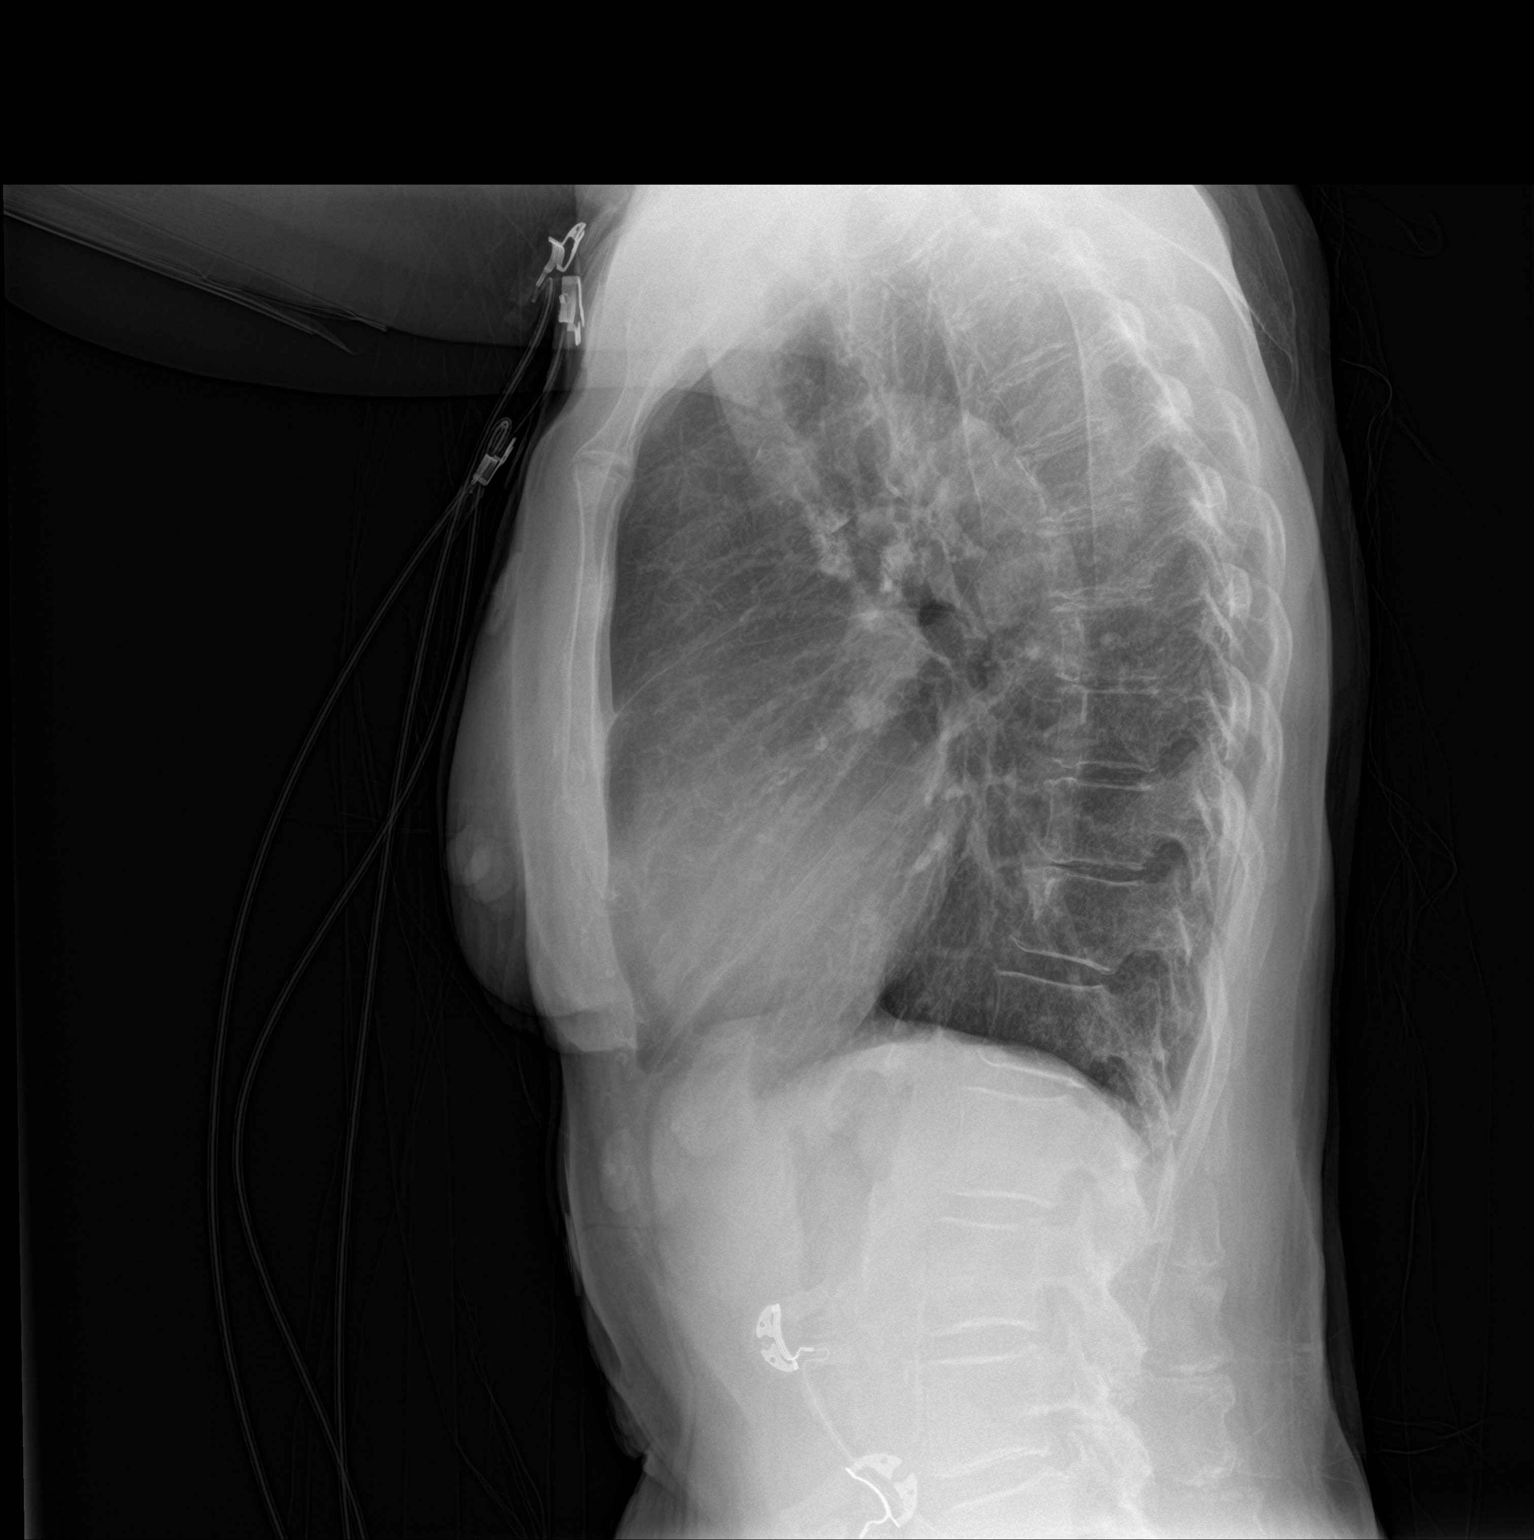

[2 of 2 positions shown; findings below may reference images not displayed]

FINDINGS: There are nipple shadows bilaterally. There is no edema or
consolidation. Heart size and pulmonary vascularity are normal.
There is a calcified sub- carinal lymph node. No adenopathy. There
is aortic atherosclerosis. There is mild degenerative change in the
thoracic spine.
IMPRESSION: Evidence prior granulomatous disease with calcified sub- carinal
lymph node. No adenopathy. No edema or consolidation. There is
aortic atherosclerosis.

## 2018-10-13 IMAGING — NM NM MISC PROCEDURE
9 series · 54 of 54 positions shown · non-contrast
Comparison: none

[Series 1: wbr_r-proj_st wbr rest · 6.40mm/px · 6 of 64 frames shown]
[frame 6/64]
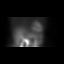
[frame 16/64]
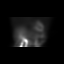
[frame 27/64]
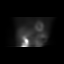
[frame 38/64]
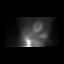
[frame 48/64]
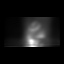
[frame 59/64]
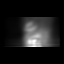

[Series 1: wbr rest · 6.40mm/px · 6 of 64 frames shown]
[frame 6/64]
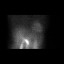
[frame 16/64]
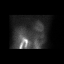
[frame 27/64]
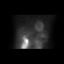
[frame 38/64]
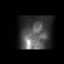
[frame 48/64]
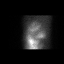
[frame 59/64]
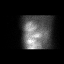

[Series 1: rest sax · 6.4mm · 6.40mm/px · 6 of 20 frames shown]
[frame 2/20]
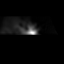
[frame 5/20]
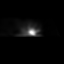
[frame 9/20]
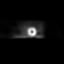
[frame 12/20]
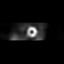
[frame 15/20]
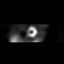
[frame 19/20]
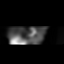

[Series 2: wbr stress-gsp · 6.40mm/px · 6 of 512 frames shown]
[frame 43/512]
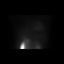
[frame 128/512]
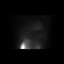
[frame 214/512]
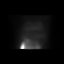
[frame 299/512]
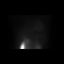
[frame 384/512]
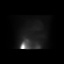
[frame 470/512]
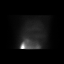

[Series 2: stress sax · 6.4mm · 6.40mm/px · 6 of 20 frames shown]
[frame 2/20]
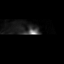
[frame 5/20]
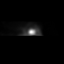
[frame 9/20]
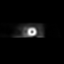
[frame 12/20]
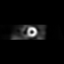
[frame 15/20]
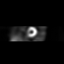
[frame 19/20]
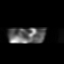

[Series 2: stress sax gs · 6.4mm · 6.40mm/px · 6 of 160 frames shown]
[frame 14/160]
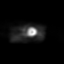
[frame 40/160]
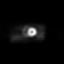
[frame 67/160]
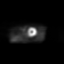
[frame 94/160]
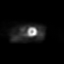
[frame 120/160]
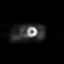
[frame 147/160]
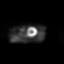

[Series 2: wbr_s-proj_st wbr stress-gsp · 6.40mm/px · 6 of 512 frames shown]
[frame 43/512]
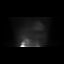
[frame 128/512]
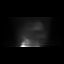
[frame 214/512]
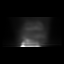
[frame 299/512]
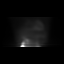
[frame 384/512]
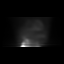
[frame 470/512]
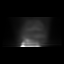

[Series 3: wbr stress-sum-em · 6.40mm/px · 6 of 64 frames shown]
[frame 6/64]
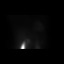
[frame 16/64]
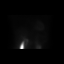
[frame 27/64]
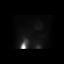
[frame 38/64]
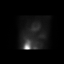
[frame 48/64]
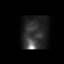
[frame 59/64]
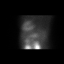

[Series 3: wbr_s-proj_st wbr stress-sum-em · 6.40mm/px · 6 of 64 frames shown]
[frame 6/64]
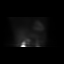
[frame 16/64]
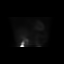
[frame 27/64]
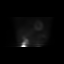
[frame 38/64]
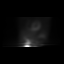
[frame 48/64]
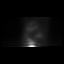
[frame 59/64]
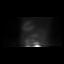

[54 of 54 positions shown; findings below may reference images not displayed]

Canned report from images found in remote index.

Refer to host system for actual result text.

## 2019-09-30 ENCOUNTER — Inpatient Hospital Stay
Admit: 2019-09-30 | Discharge: 2019-09-30 | Disposition: A | Discharge status: Home / Self Care | Payer: MEDICARE | Attending: Emergency Medicine

## 2019-09-30 ENCOUNTER — Emergency Department: Admit: 2019-09-30 | Payer: MEDICARE | Primary: Nurse Practitioner

## 2019-09-30 DIAGNOSIS — M7981 Nontraumatic hematoma of soft tissue: Secondary | ICD-10-CM

## 2019-09-30 MED ORDER — ACETAMINOPHEN 325 MG PO TABS
325 MG | Freq: Once | ORAL | Status: DC
Start: 2019-09-30 — End: 2019-09-30

## 2019-09-30 MED ORDER — IBUPROFEN 400 MG PO TABS
400 MG | Freq: Once | ORAL | Status: DC
Start: 2019-09-30 — End: 2019-09-30

## 2019-09-30 MED ORDER — METHOCARBAMOL 500 MG PO TABS
500 | ORAL_TABLET | Freq: Four times a day (QID) | ORAL | 0 refills | Status: AC
Start: 2019-09-30 — End: 2019-10-10

## 2019-09-30 NOTE — ED Provider Notes (Signed)
eMERGENCY dEPARTMENT eNCOUnter        [place]    Chief Complaint   Patient presents with   ??? Foot Pain     Pt CO R foot pain/hip pain, states on Monday she dropped a frozen chicken on it. Foot has swelling and redness noted, decreased mobility       HPI    Amber Palmer is a 86 y.o. female who presents to the ER for evaluation of right hip pain right foot pain status post blunt trauma, approximately 3 days prior.  No motor or sensory deficit exacerbation with range of motion, aching cramping pain right hip.  No pulse deficit.  No history of thrombosis.    REVIEW OF SYSTEMS    See HPI for further details. Review of systems otherwise negative.     PAST MEDICAL HISTORY    Past Medical History:   Diagnosis Date   ??? Hyperlipidemia    ??? Hypertension    ??? Vitamin D deficiency        SURGICAL HISTORY    History reviewed. No pertinent surgical history.    CURRENT MEDICATIONS    Current Outpatient Rx   Medication Sig Dispense Refill   ??? Ascorbic Acid (VITAMIN C) 250 MG tablet Take 250 mg by mouth daily     ??? tamsulosin (FLOMAX) 0.4 MG capsule Take 1 capsule by mouth daily for 5 doses 5 capsule 0   ??? naproxen (NAPROSYN) 250 MG tablet Take 1 tablet by mouth 2 times daily (with meals) for 10 days 20 tablet 0   ??? Calcium Ascorbate 500 MG TABS Take 500 mg by mouth     ??? vitamin B-1 (THIAMINE) 100 MG tablet Take 100 mg by mouth daily     ??? vitamin E 1000 units capsule Take 1,000 Units by mouth daily     ??? calcium carbonate (OSCAL) 500 MG TABS tablet Take 500 mg by mouth daily     ??? lisinopril (PRINIVIL;ZESTRIL) 20 MG tablet Take 1 tablet by mouth daily 30 tablet 3   ??? atorvastatin (LIPITOR) 20 MG tablet Take 1 tablet by mouth daily 30 tablet 3   ??? vitamin D (CHOLECALCIFEROL) 50000 UNIT CAPS Take 1 capsule by mouth once a week 4 capsule 2       ALLERGIES    Allergies   Allergen Reactions   ??? Bee Venom        FAMILY HISTORY    History reviewed. No pertinent family history.    SOCIAL HISTORY    Social History     Socioeconomic  History   ??? Marital status: Divorced     Spouse name: None   ??? Number of children: None   ??? Years of education: None   ??? Highest education level: None   Occupational History   ??? None   Tobacco Use   ??? Smoking status: Never Smoker   ??? Smokeless tobacco: Never Used   Substance and Sexual Activity   ??? Alcohol use: No   ??? Drug use: Never   ??? Sexual activity: None   Other Topics Concern   ??? None   Social History Narrative   ??? None     Social Determinants of Health     Financial Resource Strain:    ??? Difficulty of Paying Living Expenses:    Food Insecurity:    ??? Worried About Charity fundraiser in the Last Year:    ??? Ran Out of Food in the Last Year:  Transportation Needs:    ??? Film/video editor (Medical):    ??? Lack of Transportation (Non-Medical):    Physical Activity:    ??? Days of Exercise per Week:    ??? Minutes of Exercise per Session:    Stress:    ??? Feeling of Stress :    Social Connections:    ??? Frequency of Communication with Friends and Family:    ??? Frequency of Social Gatherings with Friends and Family:    ??? Attends Religious Services:    ??? Marine scientist or Organizations:    ??? Attends Music therapist:    ??? Marital Status:    Intimate Production manager Violence:    ??? Fear of Current or Ex-Partner:    ??? Emotionally Abused:    ??? Physically Abused:    ??? Sexually Abused:        PHYSICAL EXAM    VITAL SIGNS: BP (!) 117/105    Pulse 74    Temp 98.6 ??F (37 ??C) (Oral)    Resp 16    SpO2 100%   Constitutional:  Well developed, well nourished, no acute distress, non-toxic appearance   HENT:  Atraumatic, external ears normal, nose normal, oropharynx moist.  Neck- normal range of motion, no tenderness, supple   Respiratory:  No respiratory distress, normal breath sounds.   Cardiovascular:  Normal rate, normal rhythm, no murmurs, no gallops, no rubs   GI:   no distention  Musculoskeletal: Mild tenderness of the dorsal foot, no marked medial thigh or knee tenderness full range of motion of the right  hip,  Integument:  Well hydrated, no rash   Neurologic: No sensory or motor deficit    RADIOLOGY/PROCEDURES    Right foot and hip: No fracture or dislocation    ED COURSE & MEDICAL DECISION MAKING    Pertinent Labs & Imaging studies reviewed. (See chart for details)  Patient presents to the ER for evaluation mechanical blunt trauma 2 days ago, complaining of hip and foot pain with mild muscle skeletal spasm without evidence of fracture or dislocation.  Stable for outpatient management muscle relaxants as necessary.    FINAL IMPRESSION    1.  Right hip pain  2.  Right foot contusion        Marcella Dubs, MD  95/18/84 [phone]

## 2019-09-30 NOTE — ED Notes (Signed)
Pt discharged home, verbalized discharge instructions.  Ambulated with assistance to exit without difficulty.         Benjaman Kindler, RN  09/30/19 [phone]

## 2021-02-03 NOTE — Telephone Encounter (Signed)
Pt arrived in the office with her son stating she was scheduled for an apt. No referral in chart or appt hx. Pt son stated they see Dr. Humphrey Rolls stated he's a [address]. Please contact and obtain referral per Dr. Alessandra Bevels.

## 2021-04-26 NOTE — Telephone Encounter (Signed)
Pt came into [facility] office for an app.   Pt has be directed to correct office.       Pt should be following up with Urology not Nephrology.             Tonette Lederer  Patient Care Coordinator for   [facility]  Dr. Garry Heater, Dr. Wyline Copas and Dr. Alessandra Bevels

## 2021-07-24 ENCOUNTER — Inpatient Hospital Stay: Admit: 2021-07-24 | Payer: MEDICARE | Primary: Nurse Practitioner

## 2021-07-24 ENCOUNTER — Encounter

## 2021-07-24 ENCOUNTER — Inpatient Hospital Stay: Payer: MEDICARE | Primary: Nurse Practitioner

## 2021-07-24 DIAGNOSIS — N201 Calculus of ureter: Secondary | ICD-10-CM

## 2021-08-18 ENCOUNTER — Emergency Department: Admit: 2021-08-18 | Payer: MEDICARE | Primary: Nurse Practitioner

## 2021-08-18 ENCOUNTER — Inpatient Hospital Stay
Admission: EM | Admit: 2021-08-18 | Discharge: 2021-08-25 | Disposition: A | Discharge status: Home / Self Care | Payer: MEDICARE | Admitting: Internal Medicine

## 2021-08-18 DIAGNOSIS — C259 Malignant neoplasm of pancreas, unspecified: Secondary | ICD-10-CM

## 2021-08-18 DIAGNOSIS — R531 Weakness: Secondary | ICD-10-CM

## 2021-08-18 LAB — URINALYSIS WITH REFLEX TO CULTURE
Bilirubin Urine: NEGATIVE
Glucose, Ur: NEGATIVE mg/dL
Ketones, Urine: 80 mg/dL — AB
Nitrite, Urine: NEGATIVE
Protein, UA: 30 mg/dL — AB
Specific Gravity, UA: 1.025 (ref 1.005–1.030)
Urobilinogen, Urine: 1 E.U./dL (ref <2.0)
pH, UA: 5.5 (ref 5.0–8.0)

## 2021-08-18 LAB — CBC WITH AUTO DIFFERENTIAL
Basophils %: 0.8 %
Basophils Absolute: 0 10*3/uL (ref 0.0–0.2)
Eosinophils %: 0.7 %
Eosinophils Absolute: 0 10*3/uL (ref 0.0–0.6)
Hematocrit: 46.2 % (ref 36.0–48.0)
Hemoglobin: 14.9 g/dL (ref 12.0–16.0)
Lymphocytes %: 23.1 %
Lymphocytes Absolute: 1.3 10*3/uL (ref 1.0–5.1)
MCH: 32.9 pg (ref 26.0–34.0)
MCHC: 32.3 g/dL (ref 31.0–36.0)
MCV: 101.8 fL — ABNORMAL HIGH (ref 80.0–100.0)
MPV: 10.1 fL (ref 5.0–10.5)
Monocytes %: 10.4 %
Monocytes Absolute: 0.6 10*3/uL (ref 0.0–1.3)
Neutrophils %: 65 %
Neutrophils Absolute: 3.7 10*3/uL (ref 1.7–7.7)
Platelets: 106 10*3/uL — ABNORMAL LOW (ref 135–450)
RBC: 4.54 M/uL (ref 4.00–5.20)
RDW: 13.5 % (ref 12.4–15.4)
WBC: 5.8 10*3/uL (ref 4.0–11.0)

## 2021-08-18 LAB — COMPREHENSIVE METABOLIC PANEL
ALT: 25 U/L (ref 10–40)
AST: 28 U/L (ref 15–37)
Albumin/Globulin Ratio: 1.2 (ref 1.1–2.2)
Albumin: 4.4 g/dL (ref 3.4–5.0)
Alkaline Phosphatase: 72 U/L (ref 40–129)
Anion Gap: 14 (ref 3–16)
BUN: 34 mg/dL — ABNORMAL HIGH (ref 7–20)
CO2: 22 mmol/L (ref 21–32)
Calcium: 10.7 mg/dL — ABNORMAL HIGH (ref 8.3–10.6)
Chloride: 105 mmol/L (ref 99–110)
Creatinine: 1.1 mg/dL (ref 0.6–1.2)
Est, Glom Filt Rate: 48 — AB (ref >60)
Glucose: 120 mg/dL — ABNORMAL HIGH (ref 70–99)
Potassium: 5.1 mmol/L (ref 3.5–5.1)
Sodium: 141 mmol/L (ref 136–145)
Total Bilirubin: 0.8 mg/dL (ref 0.0–1.0)
Total Protein: 8.1 g/dL (ref 6.4–8.2)

## 2021-08-18 LAB — MAGNESIUM: Magnesium: 2.7 mg/dL — ABNORMAL HIGH (ref 1.80–2.40)

## 2021-08-18 LAB — MICROSCOPIC URINALYSIS: RBC, UA: >100 /HPF — AB (ref 0–4)

## 2021-08-18 LAB — LIPASE: Lipase: 487 U/L — ABNORMAL HIGH (ref 13.0–60.0)

## 2021-08-18 LAB — TROPONIN: Troponin, High Sensitivity: 11 ng/L (ref 0–14)

## 2021-08-18 LAB — AMYLASE: Amylase: 256 U/L — ABNORMAL HIGH (ref 25–115)

## 2021-08-18 MED ORDER — CEFTRIAXONE SODIUM 1 G IJ SOLR
1 g | Freq: Once | INTRAMUSCULAR | Status: DC
Start: 2021-08-18 — End: 2021-08-18

## 2021-08-18 MED ORDER — SODIUM CHLORIDE 0.9 % IV BOLUS
0.9 % | Freq: Once | INTRAVENOUS | Status: AC
Start: 2021-08-18 — End: 2021-08-18
  Administered 2021-08-19: 500 mL via INTRAVENOUS

## 2021-08-18 MED ORDER — IOPAMIDOL 76 % IV SOLN
76 % | Freq: Once | INTRAVENOUS | Status: AC | PRN
Start: 2021-08-18 — End: 2021-08-18
  Administered 2021-08-18: 75 mL via INTRAVENOUS

## 2021-08-18 MED FILL — CEFTRIAXONE SODIUM 1 G IJ SOLR: 1 g | INTRAMUSCULAR | Qty: 1000

## 2021-08-18 NOTE — H&P (Signed)
V2.0  History and Physical      Name:  Amber Palmer DOB/Age/Sex: 1929-07-19  (88 y.o. female)   MRN & CSN:  [id number] & [id number] Encounter Date/Time: 08/18/2021 11:47 PM EDT   Location:  [id number] PCP: Valli Glance, APRN - NP       Date of Admission: 08/18/2021     Date of Service: Pt seen/examined on .08/18/21. and Admitted to Inpatient with expected LOS greater than two midnights due to medical therapy.     Hospital Day: 1    Assessment and Plan:   Amber Palmer is a 88 y.o. female with a past medical history of HTN who presents with Pancreatic cancer [facility])    Hospital problem list  Hospital Problems             Last Modified POA    * (Principal) Pancreatic cancer ([place]) 08/18/2021 Yes    Hypertension 08/18/2021 Yes    Hyperlipidemia 08/18/2021 Yes    Vitamin D deficiency 08/18/2021 Yes    Failure to thrive in adult 08/18/2021 Yes    Severe protein-calorie malnutrition ([place]) 08/18/2021 Yes    Weight loss 08/18/2021 Yes       Assessment :     Pancreatic cancer, new diagnosis   Failure to thrive in adult  Severe protein-calorie malnutrition  Weight loss  Hypertension  Hyperlipidemia  Vitamin D deficiency    Plan:    Admit to [facility]   May need MRI with pancreatic mass protocol, GI consult for biopsy via ERCP or IR consult for CT guided biopsy   Continue gentle hydration with IV normal saline at 75 mL/hr   IV ceftriaxone 1 g daily for UTI pending culture data   PT/OT and nutrition consult for supportive care  Decrease lisinopril from 20 mg daily to 5 mg daily due to recent weight loss. She may not even need BP meds      Disposition:   Current Living situation: Home  Expected Disposition: Home  Estimated D/C: 2-3 days  PT/OT Eval Status:     Diet Diet NPO Exceptions are: Sips of Water with Meds   DVT Prophylaxis '[x]'$  Lovenox, '[]'$   Heparin, '[]'$  SCDs, '[]'$  Ambulation,  '[]'$  Eliquis, '[]'$  Xarelto, '[]'$  Coumadin   Code Status Full Code   Surrogate Decision Maker/ POA Documented in the chart       Personally reviewed Lab Studies and Imaging     Discussed management of the case with ER provider who recommended admission     EKG interpreted personally and results sinus rhythm     Imaging that was interpreted personally includes CT abd/pelvis and results pancreatic head     Drugs that require monitoring for toxicity include IV antibiotics and the method of monitoring was daily labs    History from:       History was obtained from the patient and her 2 sons at the bedside     Chief Complaint:     Chief Complaint   Patient presents with    Fatigue     Pt to ED with reports of generalized weakness and decreased appetite x2-3 weeks. Pt dx with UTI and under treatment. Has had multiple abx.        History of Present Illness:       Amber Palmer is 88 y.o. female with history of HTN and HLP  She now presents to the ER with complaint of increasing fatigue and weakness.   She lost 20 pound weight  loss over the last 4 weeks.   Today her wight is 102 and BMI 18  She also lost her appetite for the past few weeks  She lives with her son and ambulate without a cane or walker.   She has mild dementia but appears fairly well functioning  The patient has been being treated for a urinary tract infection outpatient and not improving.   Work up in the ED shows UTI and CT abd/pelvis concerning for pancreatic cancer         Review of Systems:        Pertinent positives and negatives discussed in HPI     Objective:     Intake/Output Summary (Last 24 hours) at 08/18/2021 2350  Last data filed at 08/18/2021 2117  Gross per 24 hour   Intake 2150 ml   Output --   Net 2150 ml      Vitals:   Vitals:    08/18/21 2100 08/18/21 2115 08/18/21 2130 08/18/21 2145   BP: 115/71 110/61 110/63 118/68   Pulse: 90 88 87 92   Resp: '16 16 16 13   '$ Temp:       TempSrc:       SpO2:       Weight:    102 lb 9.6 oz (46.5 kg)   Height:           Medications Prior to Admission     Prior to Admission medications    Medication Sig Start Date End Date Taking?  Authorizing Provider   Ascorbic Acid (VITAMIN C) 250 MG tablet Take 1 tablet by mouth daily    Historical Provider, MD   tamsulosin (FLOMAX) 0.4 MG capsule Take 1 capsule by mouth daily for 5 doses 07/20/17 07/25/17  Eleanora Neighbor, PA-C   naproxen (NAPROSYN) 250 MG tablet Take 1 tablet by mouth 2 times daily (with meals) for 10 days 07/11/17 07/21/17  Earley Abide, MD   Calcium Ascorbate 500 MG TABS Take 500 mg by mouth 02/11/09   Historical Provider, MD   vitamin B-1 (THIAMINE) 100 MG tablet Take 1 tablet by mouth daily    Historical Provider, MD   vitamin E 1000 units capsule Take 1 capsule by mouth daily    Historical Provider, MD   calcium carbonate (OSCAL) 500 MG TABS tablet Take 1 tablet by mouth daily    Historical Provider, MD   lisinopril (PRINIVIL;ZESTRIL) 20 MG tablet Take 1 tablet by mouth daily 12/10/13   Ladona Horns, MD   atorvastatin (LIPITOR) 20 MG tablet Take 1 tablet by mouth daily 12/10/13   Ladona Horns, MD   vitamin D (CHOLECALCIFEROL) 50000 UNIT CAPS Take 1 capsule by mouth once a week  Patient taking differently: Take 1 capsule by mouth once a week Last taken on 5/26 12/10/13   Ladona Horns, MD       Physical Exam:    Physical Exam     General: NAD  Eyes: EOMI  ENT: neck supple  Cardiovascular: Regular rate.  Respiratory: Clear to auscultation  Gastrointestinal: Soft, non tender  Genitourinary: no suprapubic tenderness  Musculoskeletal: No edema  Skin: warm, dry  Neuro: Alert.  Psych: Mood appropriate.       Past Medical History:   PMHx   Past Medical History:   Diagnosis Date    Hyperlipidemia     Hypertension     Pancreatic cancer ([place]) 08/18/2021    Vitamin D deficiency  PSHX:  has no past surgical history on file.  Allergies:   Allergies   Allergen Reactions    Bee Venom      Fam HX: She denies family history of cancer  Reviewed and negative in regards to presenting illness/complaint.  Soc HX:   Social History     Socioeconomic History    Marital status: Divorced     Spouse  name: None    Number of children: None    Years of education: None    Highest education level: None   Tobacco Use    Smoking status: Never    Smokeless tobacco: Never   Substance and Sexual Activity    Alcohol use: No    Drug use: Never       Medications:   Medications:    sodium chloride flush  5-40 mL IntraVENous 2 times per day    [START ON 08/19/2021] enoxaparin  30 mg SubCUTAneous Daily    [START ON 08/19/2021] atorvastatin  20 mg Oral Daily    [START ON 08/19/2021] lisinopril  20 mg Oral Daily      Infusions:    sodium chloride      sodium chloride 75 mL/hr at 08/18/21 2231     PRN Meds: sodium chloride flush, 5-40 mL, PRN  sodium chloride, , PRN  ondansetron, 4 mg, Q8H PRN   Or  ondansetron, 4 mg, Q6H PRN  polyethylene glycol, 17 g, Daily PRN  acetaminophen, 650 mg, Q6H PRN   Or  acetaminophen, 650 mg, Q6H PRN        Labs      CBC:   Recent Labs     08/18/21  1638   WBC 5.8   HGB 14.9   PLT 106*     BMP:    Recent Labs     08/18/21  1638   NA 141   K 5.1   CL 105   CO2 22   BUN 34*   CREATININE 1.1   GLUCOSE 120*     Hepatic:   Recent Labs     08/18/21  1638   AST 28   ALT 25   BILITOT 0.8   ALKPHOS 72     Lipids:   Lab Results   Component Value Date/Time    CHOL 235 06/18/2013 03:40 PM    HDL 72 06/18/2013 03:40 PM    TRIG 58 06/18/2013 03:40 PM     Hemoglobin A1C: No results found for: LABA1C  TSH:   Lab Results   Component Value Date/Time    TSH 1.90 06/18/2013 03:40 PM     Troponin: No results found for: TROPONINT  Lactic Acid: No results for input(s): LACTA in the last 72 hours.  BNP: No results for input(s): PROBNP in the last 72 hours.  UA:  Lab Results   Component Value Date/Time    NITRU Negative 08/18/2021 04:38 PM    COLORU DARK YELLOW 08/18/2021 04:38 PM    PHUR 5.5 08/18/2021 04:38 PM    WBCUA 21-50 08/18/2021 04:38 PM    RBCUA >100 08/18/2021 04:38 PM    MUCUS 1+ 08/23/2008 06:20 AM    BACTERIA 2+ 08/18/2021 04:38 PM    CLARITYU CLOUDY 08/18/2021 04:38 PM    SPECGRAV 1.025 08/18/2021 04:38 PM     LEUKOCYTESUR MODERATE 08/18/2021 04:38 PM    UROBILINOGEN 1.0 08/18/2021 04:38 PM    BILIRUBINUR Negative 08/18/2021 04:38 PM    BILIRUBINUR NEGATIVE 08/23/2008 06:20 AM  BLOODU LARGE 08/18/2021 04:38 PM    GLUCOSEU Negative 08/18/2021 04:38 PM    GLUCOSEU NEGATIVE 08/23/2008 06:20 AM    KETUA 80 08/18/2021 04:38 PM     Urine Cultures:   Lab Results   Component Value Date/Time    LABURIN No growth at 18-36 hours 08/15/2017 02:06 PM     Blood Cultures: No results found for: BC  No results found for: BLOODCULT2  Organism: No results found for: ORG    Imaging/Diagnostics Last 24 Hours   CT ABDOMEN PELVIS W IV CONTRAST Additional Contrast? None    Result Date: 08/18/2021  EXAMINATION: CT OF THE ABDOMEN AND PELVIS WITH CONTRAST 08/18/2021 6:46 pm TECHNIQUE: CT of the abdomen and pelvis was performed with the administration of intravenous contrast. Multiplanar reformatted images are provided for review. Automated exposure control, iterative reconstruction, and/or weight based adjustment of the mA/kV was utilized to reduce the radiation dose to as low as reasonably achievable. COMPARISON: 07/20/2017 and prior HISTORY: ORDERING SYSTEM PROVIDED HISTORY: abd pain - elevated lipase TECHNOLOGIST PROVIDED HISTORY: If patient is on cardiac monitor and/or pulse ox, they may be taken off cardiac monitor and pulse ox, left on O2 if currently on. All monitors reattached when patient returns to room. Additional Contrast?->None Reason for exam:->abd pain - elevated lipase Reason for Exam: abd pain - elevated lipase FINDINGS: Lower Chest: Mild dependent subsegmental atelectasis. Organs: A few subcentimeter hypodensities in the liver and kidneys that are too small to characterize but most likely represent simple cysts that do not require follow up imaging..  Normal gallbladder. No evidence of biliary ductal dilatation. Spleen is normal in size. Increased heterogeneous hypodense lesion near the pancreatic head measuring 2 x 1 cm,  previously 1.4 x 1.2..  No evidence of ductal dilatation. Normal adrenal glands. A simple cyst in each kidney that requires no further follow-up imaging.  A 4 mm calculus in the lower pole of the left kidney.  No hydronephrosis. Pelvis: Normal bladder. Retroverted uterus. GI/Bowel: Normal stomach. Scattered wall thickening of the small and large bowel.  Appendix is normal. Peritoneum/Retroperitoneum:No free fluid, free air, organized fluid collection or lymphadenopathy. Moderate calcific atherosclerosis. Soft tissues: Normal. Bones: Diffuse demineralization with moderate multilevel degenerative disc disease.     Scattered wall thickening of the small and large bowel could be secondary to under distension versus enterocolitis. Increased heterogeneous hypodense lesion near the pancreatic head could represent an intraductal papillary mucinous neoplasm.  If clinically warranted, further characterization could be obtained with an MRI pancreatic mass protocol. Nonobstructive left nephrolithiasis.     XR CHEST PORTABLE    Result Date: 08/18/2021  EXAMINATION: ONE XRAY VIEW OF THE CHEST 08/18/2021 4:53 pm COMPARISON: June 02, 2013 HISTORY: ORDERING SYSTEM PROVIDED HISTORY: Weakness TECHNOLOGIST PROVIDED HISTORY: Reason for exam:->Weakness Reason for Exam: Weakness FINDINGS: The lungs are without acute focal process.  There is no effusion or pneumothorax. The cardiomediastinal silhouette is without acute process. The osseous structures are without acute process.     No acute process.         Electronically signed by Sheryn Bison, MD on 08/18/2021 at 11:50 PM

## 2021-08-18 NOTE — ED Notes (Signed)
Report given to [facility]     Toney Sang, RN  08/18/21 2156

## 2021-08-18 NOTE — ED Provider Notes (Signed)
This patient was seen by the Mid-Level Provider. I have seen and examined the patient, agree with the workup, evaluation, management and diagnosis. Care plan has been discussed. My assessment reveals a 88 year old female who presents with some generalized weakness.  This is a 88 year old female currently being treated for urinary tract infection on outpatient basis who presents with increasing weakness.  The patient has also lost 20 pounds over the last 2 weeks.  The family states she is not eating as much and she is just getting worse.  She denies any abdominal pain.  She denies any fevers or chills.          Radiology results:    CT ABDOMEN PELVIS W IV CONTRAST Additional Contrast? None   Final Result   Scattered wall thickening of the small and large bowel could be secondary to   under distension versus enterocolitis.      Increased heterogeneous hypodense lesion near the pancreatic head could   represent an intraductal papillary mucinous neoplasm.  If clinically   warranted, further characterization could be obtained with an MRI pancreatic   mass protocol.      Nonobstructive left nephrolithiasis.         XR CHEST PORTABLE   Final Result   No acute process.               LABS:    Labs Reviewed   CBC WITH AUTO DIFFERENTIAL - Abnormal; Notable for the following components:       Result Value    MCV 101.8 (*)     Platelets 106 (*)     All other components within normal limits   COMPREHENSIVE METABOLIC PANEL - Abnormal; Notable for the following components:    Glucose 120 (*)     BUN 34 (*)     Est, Glom Filt Rate 48 (*)     Calcium 10.7 (*)     All other components within normal limits   LIPASE - Abnormal; Notable for the following components:    Lipase 487.0 (*)     All other components within normal limits   URINALYSIS WITH REFLEX TO CULTURE - Abnormal; Notable for the following components:    Color, UA DARK YELLOW (*)     Clarity, UA CLOUDY (*)     Ketones, Urine 80 (*)     Blood, Urine LARGE (*)     Protein,  UA 30 (*)     Leukocyte Esterase, Urine MODERATE (*)     All other components within normal limits   MAGNESIUM - Abnormal; Notable for the following components:    Magnesium 2.70 (*)     All other components within normal limits   AMYLASE - Abnormal; Notable for the following components:    Amylase 256 (*)     All other components within normal limits   MICROSCOPIC URINALYSIS - Abnormal; Notable for the following components:    Hyaline Casts, UA 3-5 (*)     WBC, UA 21-50 (*)     RBC, UA >100 (*)     Bacteria, UA 2+ (*)     All other components within normal limits   CULTURE, URINE   TROPONIN           EKG:    Sinus tachycardia at a rate of 111 beats a minute with no acute ST elevations or depressions or pathologic Q waves.    Exam:    Thin pleasant female in no acute distress.  Her mucous  remains appear to be dry.  Her abdomen is completely soft and benign with no guarding or rebound.      Medical decision making:    Very pleasant 88 year old female who presents with increasing fatigue and weight loss over the last several weeks.  The patient's work-up today is consistent with a recurrent urinary tract infection that is failed outpatient therapy.  In addition, her work-up showed an elevated lipase and a CT was obtained that shows a possible lesion at the head of her pancreas that likely further worked up.  I will recommend admission for further care and IV antibiotics.          FINAL IMPRESSION:    1. Generalized weakness    2. Elevated lipase    3. Hypermagnesemia    4. Elevated BUN    5. Acute cystitis with hematuria    6. Pancreatic mass           Domingo Sep, MD  08/18/21 2046

## 2021-08-18 NOTE — ED Provider Notes (Signed)
Amber Palmer        Pt Name: Amber Palmer  MRN: [id number]  Amber Palmer September 24, 1929  Date of evaluation: 08/18/2021  Provider: Bess Harvest, PA-C  PCP: Valli Glance, APRN - NP  Note Started: 5:07 PM EDT 08/18/21       I have seen and evaluated this patient with my supervising physician Domingo Sep, MD.      [place]       Chief Complaint   Patient presents with    Fatigue     Pt to ED with reports of generalized weakness and decreased appetite x2-3 weeks. Pt dx with UTI and under treatment. Has had multiple abx.        HISTORY OF PRESENT ILLNESS: 1 or more Elements     History from : Patient    Limitations to history : None    Amber Palmer is a 88 y.o. female with a history of hypertension and hyperlipidemia who presents to the emergency department today complaining of 2 to 3 weeks of generalized weakness and fatigue, decreased appetite, and little oral intake.  Patient states she has been treated several times recently for urinary tract infections.  Patient denies headache, lightheadedness or dizziness.  She denies chest pain or shortness of breath.  She has no GI complaints.  She denies having any dysuria or hematuria at this time.  Patient's blood pressure is 111/60 on arrival and she is slightly tachycardic at 106.    Nursing Notes were all reviewed and agreed with or any disagreements were addressed in the HPI.    REVIEW OF SYSTEMS :      Review of Systems   Constitutional:  Positive for appetite change and fatigue. Negative for chills and fever.   Respiratory:  Negative for cough, chest tightness and shortness of breath.    Cardiovascular:  Negative for chest pain and palpitations.   Gastrointestinal:  Negative for abdominal pain, diarrhea, nausea and vomiting.   Genitourinary:  Negative for difficulty urinating, dysuria, flank pain, frequency, hematuria and urgency.   Neurological:  Positive for weakness. Negative for dizziness,  light-headedness, numbness and headaches.   All other systems reviewed and are negative.    Positives and Pertinent negatives as per HPI.     SURGICAL HISTORY   History reviewed. No pertinent surgical history.    CURRENTMEDICATIONS       Previous Medications    ASCORBIC ACID (VITAMIN C) 250 MG TABLET    Take 250 mg by mouth daily    ATORVASTATIN (LIPITOR) 20 MG TABLET    Take 1 tablet by mouth daily    CALCIUM ASCORBATE 500 MG TABS    Take 500 mg by mouth    CALCIUM CARBONATE (OSCAL) 500 MG TABS TABLET    Take 500 mg by mouth daily    LISINOPRIL (PRINIVIL;ZESTRIL) 20 MG TABLET    Take 1 tablet by mouth daily    NAPROXEN (NAPROSYN) 250 MG TABLET    Take 1 tablet by mouth 2 times daily (with meals) for 10 days    TAMSULOSIN (FLOMAX) 0.4 MG CAPSULE    Take 1 capsule by mouth daily for 5 doses    VITAMIN B-1 (THIAMINE) 100 MG TABLET    Take 100 mg by mouth daily    VITAMIN D (CHOLECALCIFEROL) 50000 UNIT CAPS    Take 1 capsule by mouth once a week    VITAMIN E 1000 UNITS CAPSULE  Take 1,000 Units by mouth daily       ALLERGIES     Bee venom    FAMILYHISTORY     History reviewed. No pertinent family history.     SOCIAL HISTORY       Social History     Tobacco Use    Smoking status: Never    Smokeless tobacco: Never   Substance Use Topics    Alcohol use: No    Drug use: Never       SCREENINGS        Glasgow Coma Scale  Eye Opening: Spontaneous  Best Verbal Response: Oriented  Best Motor Response: Obeys commands  Glasgow Coma Scale Score: 15                CIWA Assessment  BP: 111/60  Pulse: (!) 106           PHYSICAL EXAM  1 or more Elements     ED Triage Vitals [08/18/21 1601]   BP Temp Temp Source Pulse Respirations SpO2 Height Weight - Scale   111/60 98.9 F (37.2 C) Oral (!) 106 16 100 % '5\' 3"'$  (1.6 m) 126 lb (57.2 kg)       Physical Exam  Vitals and nursing note reviewed.   Constitutional:       Appearance: She is well-developed. She is not diaphoretic.   HENT:      Head: Normocephalic and atraumatic.       Mouth/Throat:      Mouth: Mucous membranes are moist.   Eyes:      General:         Right eye: No discharge.         Left eye: No discharge.      Extraocular Movements: Extraocular movements intact.      Conjunctiva/sclera: Conjunctivae normal.      Pupils: Pupils are equal, round, and reactive to light.   Cardiovascular:      Rate and Rhythm: Regular rhythm. Tachycardia present.      Pulses: Normal pulses.      Heart sounds: Normal heart sounds.   Pulmonary:      Effort: Pulmonary effort is normal. No respiratory distress.      Breath sounds: Normal breath sounds.   Abdominal:      General: Abdomen is flat. Bowel sounds are normal. There is no distension.      Palpations: Abdomen is soft.      Tenderness: There is no abdominal tenderness. There is no guarding.   Musculoskeletal:         General: Normal range of motion.      Cervical back: Normal range of motion and neck supple.   Skin:     General: Skin is warm and dry.      Capillary Refill: Capillary refill takes less than 2 seconds.      Coloration: Skin is not pale.   Neurological:      Mental Status: She is alert and oriented to person, place, and time.   Psychiatric:         Behavior: Behavior normal.           DIAGNOSTIC RESULTS   LABS:    Labs Reviewed   CBC WITH AUTO DIFFERENTIAL - Abnormal; Notable for the following components:       Result Value    MCV 101.8 (*)     Platelets 106 (*)     All other components within normal limits   COMPREHENSIVE  METABOLIC PANEL - Abnormal; Notable for the following components:    Glucose 120 (*)     BUN 34 (*)     Est, Glom Filt Rate 48 (*)     Calcium 10.7 (*)     All other components within normal limits   LIPASE - Abnormal; Notable for the following components:    Lipase 487.0 (*)     All other components within normal limits   URINALYSIS WITH REFLEX TO CULTURE - Abnormal; Notable for the following components:    Color, UA DARK YELLOW (*)     Clarity, UA CLOUDY (*)     Ketones, Urine 80 (*)     Blood, Urine LARGE (*)      Protein, UA 30 (*)     Leukocyte Esterase, Urine MODERATE (*)     All other components within normal limits   MAGNESIUM - Abnormal; Notable for the following components:    Magnesium 2.70 (*)     All other components within normal limits   TROPONIN   AMYLASE   MICROSCOPIC URINALYSIS       When ordered only abnormal lab results are displayed. All other labs were within normal range or not returned as of this dictation.    EKG: When ordered, EKG's are interpreted by the Emergency Department Physician in the absence of a cardiologist.  Please see their note for interpretation of EKG.    RADIOLOGY:   Non-plain film images such as CT, Ultrasound and MRI are read by the radiologist. Plain radiographic images are visualized and preliminarily interpreted by the ED Provider with the below findings:        Interpretation per the Radiologist below, if available at the time of this note:    XR CHEST PORTABLE   Final Result   No acute process.         CT ABDOMEN PELVIS W IV CONTRAST Additional Contrast? None    (Results Pending)         PROCEDURES   Unless otherwise noted below, none     Procedures    CRITICAL CARE TIME (.cctime)       PAST MEDICAL HISTORY      has a past medical history of Hyperlipidemia, Hypertension, and Vitamin D deficiency.     Chronic Conditions affecting Care: None    EMERGENCY DEPARTMENT COURSE and DIFFERENTIAL DIAGNOSIS/MDM:   Vitals:    Vitals:    08/18/21 1601   BP: 111/60   Pulse: (!) 106   Resp: 16   Temp: 98.9 F (37.2 C)   TempSrc: Oral   SpO2: 100%   Weight: 126 lb (57.2 kg)   Height: '5\' 3"'$  (1.6 m)       Patient was given the following medications:  Medications   0.9 % sodium chloride bolus (has no administration in time range)   iopamidol (ISOVUE-370) 76 % injection 75 mL (has no administration in time range)         CC/HPI Summary, DDx, ED Course, and Reassessment: Patient presented to the emergency department today complaining of 2 to 3 weeks of generalized weakness and fatigue as well as  decreased appetite with very little oral intake.  Patient's blood pressure is 111/60 on arrival.  She is mildly tachycardic at 106.  She denies abdominal pain, nausea, vomiting or diarrhea.  She has no urinary complaints.  She denies chest pain or shortness of breath.  She denies headache, lightheadedness or dizziness.    EKG completed shows no  signs of acute ischemia or infarction.  Troponin is normal at 11.  Chest x-ray shows no acute cardiopulmonary disease.  CBC is without leukocytosis or anemia.  BMP with a slightly elevated BUN of 34.  LFTs are normal and lipase is elevated at 487.    I have ordered 500 mL of normal saline.    Urine analysis is pending as well as a CT scan of patient's abdomen and pelvis.        As this is the end of my shift the attending physician will continue care of this patient. Kiira Brach was signed out in stable condition. Please see Domingo Sep, MD note for further details, including diagnosis and disposition.        FINAL IMPRESSION      1. Generalized weakness    2. Elevated lipase    3. Hypermagnesemia    4. Elevated BUN          DISPOSITION/PLAN     DISPOSITION        PATIENT REFERRED TO:  No follow-up provider specified.    DISCHARGE MEDICATIONS:  New Prescriptions    No medications on file       DISCONTINUED MEDICATIONS:  Discontinued Medications    No medications on file              (Please note that portions of this note were completed with a voice recognition program.  Efforts were made to edit the dictations but occasionally words are mis-transcribed.)    Bess Harvest, PA-C (electronically signed)           Bess Harvest, PA-C  08/18/21 1751

## 2021-08-18 NOTE — Progress Notes (Signed)
Patient arrived to unit in stable condition. VSS. O2 sats 100% on room air. Respirations easy and unlabored. Bed locked and in low position. Call light within reach. No other needs expressed at this time.

## 2021-08-19 ENCOUNTER — Inpatient Hospital Stay: Admit: 2021-08-19 | Payer: MEDICARE | Primary: Nurse Practitioner

## 2021-08-19 LAB — LIPASE: Lipase: 407 U/L — ABNORMAL HIGH (ref 13.0–60.0)

## 2021-08-19 LAB — BASIC METABOLIC PANEL
Anion Gap: 11 (ref 3–16)
BUN: 24 mg/dL — ABNORMAL HIGH (ref 7–20)
CO2: 22 mmol/L (ref 21–32)
Calcium: 9.4 mg/dL (ref 8.3–10.6)
Chloride: 110 mmol/L (ref 99–110)
Creatinine: 0.9 mg/dL (ref 0.6–1.2)
Est, Glom Filt Rate: >60 (ref >60)
Glucose: 95 mg/dL (ref 70–99)
Potassium: 4.6 mmol/L (ref 3.5–5.1)
Sodium: 143 mmol/L (ref 136–145)

## 2021-08-19 LAB — CBC
Hematocrit: 37.6 % (ref 36.0–48.0)
Hemoglobin: 12.3 g/dL (ref 12.0–16.0)
MCH: 32.9 pg (ref 26.0–34.0)
MCHC: 32.8 g/dL (ref 31.0–36.0)
MCV: 100.5 fL — ABNORMAL HIGH (ref 80.0–100.0)
MPV: 10.2 fL (ref 5.0–10.5)
PLATELET SLIDE REVIEW: DECREASED
Platelets: 97 10*3/uL — ABNORMAL LOW (ref 135–450)
RBC: 3.74 M/uL — ABNORMAL LOW (ref 4.00–5.20)
RDW: 13.2 % (ref 12.4–15.4)
WBC: 4.7 10*3/uL (ref 4.0–11.0)

## 2021-08-19 LAB — EKG 12-LEAD
Atrial Rate: 111 {beats}/min
P Axis: -15 degrees
P-R Interval: 138 ms
Q-T Interval: 320 ms
QRS Duration: 64 ms
QTc Calculation (Bazett): 435 ms
R Axis: -9 degrees
T Axis: 7 degrees
Ventricular Rate: 111 {beats}/min

## 2021-08-19 LAB — HEPATIC FUNCTION PANEL
ALT: 18 U/L (ref 10–40)
AST: 19 U/L (ref 15–37)
Albumin: 3.7 g/dL (ref 3.4–5.0)
Alkaline Phosphatase: 56 U/L (ref 40–129)
Bilirubin, Direct: <0.2 mg/dL (ref 0.0–0.3)
Total Bilirubin: 0.5 mg/dL (ref 0.0–1.0)
Total Protein: 6.3 g/dL — ABNORMAL LOW (ref 6.4–8.2)

## 2021-08-19 MED ORDER — LISINOPRIL 5 MG PO TABS
5 | Freq: Every day | ORAL | Status: DC
Start: 2021-08-19 — End: 2021-08-25
  Administered 2021-08-19 – 2021-08-25 (×5): 5 mg via ORAL

## 2021-08-19 MED ORDER — ONDANSETRON HCL 4 MG/2ML IJ SOLN
4 | Freq: Four times a day (QID) | INTRAMUSCULAR | Status: DC | PRN
Start: 2021-08-19 — End: 2021-08-25

## 2021-08-19 MED ORDER — GADOBENATE DIMEGLUMINE 529 MG/ML IV SOLN
529 | INTRAVENOUS | Status: AC
Start: 2021-08-19 — End: 2021-08-19

## 2021-08-19 MED ORDER — NORMAL SALINE FLUSH 0.9 % IV SOLN
0.9 | INTRAVENOUS | Status: DC | PRN
Start: 2021-08-19 — End: 2021-08-25

## 2021-08-19 MED ORDER — LISINOPRIL 20 MG PO TABS
20 | Freq: Every day | ORAL | Status: DC
Start: 2021-08-19 — End: 2021-08-19

## 2021-08-19 MED ORDER — ATORVASTATIN CALCIUM 20 MG PO TABS
20 | Freq: Every day | ORAL | Status: DC
Start: 2021-08-19 — End: 2021-08-25
  Administered 2021-08-20 – 2021-08-25 (×5): 20 mg via ORAL

## 2021-08-19 MED ORDER — ONDANSETRON 4 MG PO TBDP
4 | Freq: Three times a day (TID) | ORAL | Status: DC | PRN
Start: 2021-08-19 — End: 2021-08-25

## 2021-08-19 MED ORDER — POLYETHYLENE GLYCOL 3350 17 G PO PACK
17 | Freq: Every day | ORAL | Status: DC | PRN
Start: 2021-08-19 — End: 2021-08-25
  Administered 2021-08-20: 23:00:00 17 g via ORAL

## 2021-08-19 MED ORDER — ACETAMINOPHEN 650 MG RE SUPP
650 | Freq: Four times a day (QID) | RECTAL | Status: DC | PRN
Start: 2021-08-19 — End: 2021-08-25

## 2021-08-19 MED ORDER — SODIUM CHLORIDE 0.9 % IV SOLN
0.9 % | INTRAVENOUS | Status: AC
Start: 2021-08-19 — End: 2021-08-19
  Administered 2021-08-19: 03:00:00 via INTRAVENOUS

## 2021-08-19 MED ORDER — SODIUM CHLORIDE 0.9 % IV SOLN
0.9 | INTRAVENOUS | Status: AC
Start: 2021-08-19 — End: 2021-08-19

## 2021-08-19 MED ORDER — GADOBENATE DIMEGLUMINE 529 MG/ML IV SOLN
529 MG/ML | Freq: Once | INTRAVENOUS | Status: AC | PRN
Start: 2021-08-19 — End: 2021-08-19
  Administered 2021-08-19: 17:00:00 9 mL via INTRAVENOUS

## 2021-08-19 MED ORDER — SODIUM CHLORIDE 0.9 % IV SOLN
0.9 | INTRAVENOUS | Status: DC | PRN
Start: 2021-08-19 — End: 2021-08-25
  Administered 2021-08-21 – 2021-08-22 (×2): via INTRAVENOUS

## 2021-08-19 MED ORDER — SODIUM CHLORIDE 0.9 % IV SOLN (MINI-BAG)
0.9 % | Freq: Once | INTRAVENOUS | Status: AC
Start: 2021-08-19 — End: 2021-08-18
  Administered 2021-08-19: 1000 mg via INTRAVENOUS

## 2021-08-19 MED ORDER — ENOXAPARIN SODIUM 30 MG/0.3ML IJ SOSY
300.3 MG/0.3ML | Freq: Every day | INTRAMUSCULAR | Status: DC
Start: 2021-08-19 — End: 2021-08-22
  Administered 2021-08-19 – 2021-08-21 (×3): 30 mg via SUBCUTANEOUS

## 2021-08-19 MED ORDER — NORMAL SALINE FLUSH 0.9 % IV SOLN
0.9 | Freq: Two times a day (BID) | INTRAVENOUS | Status: DC
Start: 2021-08-19 — End: 2021-08-25
  Administered 2021-08-19 – 2021-08-25 (×13): 10 mL via INTRAVENOUS

## 2021-08-19 MED ORDER — ACETAMINOPHEN 325 MG PO TABS
325 | Freq: Four times a day (QID) | ORAL | Status: DC | PRN
Start: 2021-08-19 — End: 2021-08-25
  Administered 2021-08-23: 04:00:00 650 mg via ORAL

## 2021-08-19 MED ORDER — SODIUM CHLORIDE 0.9 % IV SOLN (MINI-BAG)
0.9 | INTRAVENOUS | Status: DC
Start: 2021-08-19 — End: 2021-08-20
  Administered 2021-08-19 – 2021-08-20 (×2): 1000 mg via INTRAVENOUS

## 2021-08-19 MED ORDER — SODIUM CHLORIDE 0.9 % IV SOLN
0.9 % | Freq: Once | INTRAVENOUS | Status: AC
Start: 2021-08-19 — End: 2021-08-19
  Administered 2021-08-19: 17:00:00 via INTRAVENOUS

## 2021-08-19 MED FILL — CEFTRIAXONE SODIUM 1 G IJ SOLR: 1 g | INTRAMUSCULAR | Qty: 1000

## 2021-08-19 MED FILL — SODIUM CHLORIDE 0.9 % IV SOLN: 0.9 % | INTRAVENOUS | Qty: 100

## 2021-08-19 MED FILL — MULTIHANCE 529 MG/ML IV SOLN: 529 mg/mL | INTRAVENOUS | Qty: 15

## 2021-08-19 MED FILL — LISINOPRIL 5 MG PO TABS: 5 mg | ORAL | Qty: 1

## 2021-08-19 MED FILL — ATORVASTATIN CALCIUM 20 MG PO TABS: 20 mg | ORAL | Qty: 1

## 2021-08-19 MED FILL — LOVENOX 30 MG/0.3ML IJ SOSY: 30 MG/0.3ML | INTRAMUSCULAR | Qty: 0.3

## 2021-08-19 NOTE — Progress Notes (Signed)
Hospitalist Progress Note - [facility]      08/19/2021 7:51 AM  Subjective and Objective:   Admit Date: 08/18/2021  PCP: Valli Glance, APRN - NP    Chief Complaint   Patient presents with    Fatigue     Pt to ED with reports of generalized weakness and decreased appetite x2-3 weeks. Pt dx with UTI and under treatment. Has had multiple abx.         Diet NPO Exceptions are: Sips of Water with Meds     I/O last 3 completed shifts:  In: 2150 [IV [id number]]  Out: -      Patient Vitals for the past 96 hrs (Last 3 readings):   Weight   08/18/21 2145 102 lb 9.6 oz (46.5 kg)   08/18/21 1601 126 lb (57.2 kg)     Medications:      sodium chloride      sodium chloride 75 mL/hr at 08/18/21 2231      cefTRIAXone (ROCEPHIN) IV  1,000 mg IntraVENous Q24H    lisinopril  5 mg Oral Daily    sodium chloride flush  5-40 mL IntraVENous 2 times per day    enoxaparin  30 mg SubCUTAneous Daily    atorvastatin  20 mg Oral Daily       Recent Labs     08/18/21  1638 08/19/21  0629   WBC 5.8 4.7   HGB 14.9 12.3   PLT 106* 97*     Recent Labs     08/18/21  1638 08/19/21  0629   NA 141 143   K 5.1 4.6   CL 105 110   CO2 22 22   BUN 34* 24*   CREATININE 1.1 0.9   GLUCOSE 120* 95     Recent Labs     08/18/21  1638 08/19/21  0629   AST 28 19   ALT 25 18   BILITOT 0.8 0.5   ALKPHOS 72 56     Lab Results   Component Value Date/Time    TRIG 58 06/18/2013 03:40 PM    HDL 72 06/18/2013 03:40 PM    LDLCALC 151 06/18/2013 03:40 PM    CHOL 235 06/18/2013 03:40 PM     No results found for: PHART, PO2ART, PCO2ART  No results for input(s): INR in the last 72 hours.  No results for input(s): CKTOTAL, CKMB, TROPONINI in the last 72 hours.  No results for input(s): DDIMER in the last 72 hours.  No components found for: HGBA1C  Lab Results   Component Value Date/Time    TSH 1.90 06/18/2013 03:40 PM     Urine Culture:    Results for orders placed or performed during the hospital encounter of 08/15/17   Urine culture     Specimen: Urine, clean catch   Result Value Ref Range    Urine Culture, Routine No growth at 18-36 hours      Objective:   Vitals: BP 133/66   Pulse 74   Temp 98 F (36.7 C) (Oral)   Resp 18   Ht '5\' 3"'$  (1.6 m)   Wt 102 lb 9.6 oz (46.5 kg)   SpO2 99%   BMI 18.17 kg/m   Pulse Ox: SpO2  Avg: 97.4 %  Min: 90 %  Max: 100 %  Supplemental O2:      Internal Course: seen at bedside with 2 sons present, no abd pain  GENERAL:  appears comfortable and stated age, frail  CARDIOVASCULAR:  regular  RESPIRATORY:  clear without rhonchi  ABDOMEN:  non distended  EXTREMITIES:  moving appropriately     CHRONIC ANATOMY/TUBES/LINES: N/A    Running Summary, Assessment, and Plan     Amber Palmer is a 88 year old female with HTN and HLD who presented with Chief Complaint of increased weakness and weight loss.  ED HR initially 106.  Cr 1.1, Lipase 487, T bili 0.8, UA suggestive of UTI.  Urine culture obtain and she was started on Rocephin.  CXR negative for acute pathology.  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     #Suspected Pancreatis CA  -Hem/Onc and GI on consult  -Check MRCP, may suggest best location for biopsy if indicated  -T bili and LFTs WNL, likely no acute surgical intervention required   -Defer further imaging to Hem/Onc, possible CT Chest for complete workup  -Palliative Care consulted for Amber Palmer  -IV Saline at 75  -Advance diet as tolerated pending MRCP    #HTN  -Continue home Lisinopril    #HLD  -Continue home Statin     #DVT prophylaxis  -Lovenox     #Disposition:  TBD    #Barriers to discharge:  TBD    Advance Directive: Full Code     Atha Starks, MD  Rounding Hospitalist    Time Spent:  39 minutes    Has at least of one of these; Number and  Complexity of Problems Addressed at the Encounter:  -1 or more chronic illnesses with exacerbation, progression, or side effects of treatment  -2 or more stable, chronic illnesses  -1 acute illness with systemic symptoms  -1 acute, complicated injury    Risk of Complications and/or Morbidity or Mortality of Patient Management:  -Prescription drug management

## 2021-08-19 NOTE — Progress Notes (Signed)
Assessment complete. VSS. Patient resting in bed. Respirations even and easy. NPO for MRI. MRI form completed with patient and sons, who are at bedside. Call light in reach. Fall precautions in place. No needs expressed at this time.

## 2021-08-19 NOTE — Consults (Signed)
Gastroenterology Consult Note                          Patient: Amber Palmer  DOB: [birth year]  CSN#:      Date:  08/19/2021    Subjective:       History of Present Illness  Patient is a 88 y.o. African American female admitted with Hypermagnesemia [E83.41]  Pancreatic cancer ([place]) [C25.9]  Elevated BUN [R79.9]  Generalized weakness [R53.1]  Pancreatic mass [K86.89]  Acute cystitis with hematuria [N30.01]  Elevated lipase [R74.8] who is seen in consult for pancreatic mass.  She presents with 2 to 3 weeks decline mainly consisting of generalized weakness and fatigue which has resulted in decreased appetite.  Whenever she tries to eat she feels she has a bad taste.  She has also noted weight loss over the same period of time.  She denies nausea vomiting abdominal pain chest pain or shortness of breath.  She has been treated for a urinary tract infection with multiple antibiotics recently.  She she presented to the ED with the above symptoms and was found on abdominal CT to have a mass in the head of the pancreas.  We are asked to consult regarding further evaluation and treatment.      Past Medical History:   Diagnosis Date    Hyperlipidemia     Hypertension     Pancreatic cancer ([place]) 08/18/2021    Vitamin D deficiency       History reviewed. No pertinent surgical history.   Past Endoscopic History: None in Epic.    Admission Meds  No current facility-administered medications on file prior to encounter.     Current Outpatient Medications on File Prior to Encounter   Medication Sig Dispense Refill    Ascorbic Acid (VITAMIN C) 250 MG tablet Take 1 tablet by mouth daily      tamsulosin (FLOMAX) 0.4 MG capsule Take 1 capsule by mouth daily for 5 doses 5 capsule 0    naproxen (NAPROSYN) 250 MG tablet Take 1 tablet by mouth 2 times daily (with meals) for 10 days 20 tablet 0    Calcium Ascorbate 500 MG TABS Take 500 mg by mouth      vitamin B-1 (THIAMINE) 100 MG tablet Take 1 tablet by mouth daily      vitamin E  1000 units capsule Take 1 capsule by mouth daily      calcium carbonate (OSCAL) 500 MG TABS tablet Take 1 tablet by mouth daily      lisinopril (PRINIVIL;ZESTRIL) 20 MG tablet Take 1 tablet by mouth daily 30 tablet 3    atorvastatin (LIPITOR) 20 MG tablet Take 1 tablet by mouth daily 30 tablet 3    vitamin D (CHOLECALCIFEROL) 50000 UNIT CAPS Take 1 capsule by mouth once a week (Patient taking differently: Take 1 capsule by mouth once a week Last taken on 5/26) 4 capsule 2         Allergies  Allergies   Allergen Reactions    Bee Venom       Social   Social History     Tobacco Use    Smoking status: Never    Smokeless tobacco: Never   Substance Use Topics    Alcohol use: No        History reviewed. No pertinent family history.   No family history of colon cancer, Crohn's disease, or ulcerative colitis.    Review of Systems  Pertinent items  are noted in HPI.       Physical Exam  Blood pressure 136/71, pulse 80, temperature 98 F (36.7 C), temperature source Oral, resp. rate 16, height '5\' 3"'$  (1.6 m), weight 102 lb 9.6 oz (46.5 kg), SpO2 99 %.    General appearance: Thin, cachetic, alert, cooperative, no distress, appears stated age  Eyes: Anicteric  Head: Normocephalic, without obvious abnormality  Lungs: clear to auscultation bilaterally, Normal Effort  Heart: regular rate and rhythm, normal S1 and S2, no murmurs or rubs  Abdomen: soft, non-tender. Bowel sounds normal. No masses,  no organomegaly.   Extremities: atraumatic, no cyanosis or edema  Skin: warm and dry, no jaundice  Neuro: Grossly intact, A&OX3      Data Review:    Recent Labs     08/18/21  1638 08/19/21  0629   WBC 5.8 4.7   HGB 14.9 12.3   HCT 46.2 37.6   MCV 101.8* 100.5*   PLT 106* 97*     Recent Labs     08/18/21  1638 08/19/21  0629   NA 141 143   K 5.1 4.6   CL 105 110   CO2 22 22   BUN 34* 24*   CREATININE 1.1 0.9     Recent Labs     08/18/21  1638 08/19/21  0629   AST 28 19   ALT 25 18   BILIDIR  --  <0.2   BILITOT 0.8 0.5   ALKPHOS 72 56      Recent Labs     08/18/21  1638 08/18/21  1739 08/19/21  0629   LIPASE 487.0*  --  407.0*   AMYLASE  --  256*  --      No results for input(s): PROTIME, INR in the last 72 hours.  No results for input(s): PTT in the last 72 hours.  No results for input(s): OCCULTBLD in the last 72 hours.    Imaging Studies:                            CT-scan of abdomen and pelvis 08/18/2021:  FINDINGS:  Lower Chest: Mild dependent subsegmental atelectasis.     Organs: A few subcentimeter hypodensities in the liver and kidneys that are  too small to characterize but most likely represent simple cysts that do not  require follow up imaging..  Normal gallbladder. No evidence of biliary  ductal dilatation. Spleen is normal in size. Increased heterogeneous  hypodense lesion near the pancreatic head measuring 2 x 1 cm, previously 1.4  x 1.2..  No evidence of ductal dilatation. Normal adrenal glands. A simple  cyst in each kidney that requires no further follow-up imaging.  A 4 mm  calculus in the lower pole of the left kidney.  No hydronephrosis.     Pelvis: Normal bladder. Retroverted uterus.     GI/Bowel: Normal stomach. Scattered wall thickening of the small and large  bowel.  Appendix is normal.     Peritoneum/Retroperitoneum:No free fluid, free air, organized fluid  collection or lymphadenopathy. Moderate calcific atherosclerosis.     Soft tissues: Normal.     Bones: Diffuse demineralization with moderate multilevel degenerative disc  disease.     IMPRESSION:  Scattered wall thickening of the small and large bowel could be secondary to  under distension versus enterocolitis.     Increased heterogeneous hypodense lesion near the pancreatic head could  represent an intraductal papillary mucinous neoplasm.  If clinically  warranted, further characterization could be obtained with an MRI pancreatic  mass protocol.     Nonobstructive left nephrolithiasis.                 Assessment:     Principal Problem:    Pancreatic cancer  ([place])  Active Problems:    Hypertension    Hyperlipidemia    Vitamin D deficiency    Failure to thrive in adult    Severe protein-calorie malnutrition (H[facility])    Weight loss    Elevated lipase    UTI (urinary tract infection)  Resolved Problems:    * No resolved hospital problems. *    Pancreatic head mass -presenting with weakness fatigue weight loss anorexia and failure to thrive, this most likely represents a neoplasm.  The elevated serum lipase level is asymptomatic but also may suggest chronic pancreatitis as a cause of the mass (inflammatory).  IPMNs (intraductal papillary mucinous neoplasm) usually present with significant pancreatic ductal dilation if they cause a mass of this size.  The other form, sidebranch IPMN, shows small cysts.  This appears to be a solid mass and as such I am more concerned about adenocarcinoma.    I discussed this with the patient at length but also with the sons on a separate visit.  My recommendation is to proceed with an endoscopic ultrasound to improve the diagnostic yield and to attempt a tissue diagnosis with fine-needle aspiration/biopsy.  I do not believe ERCP has any role in her evaluation.  The EUS can be performed as an outpatient.  I gave the patient my card to call and schedule this if it is decided to proceed in this manner.    Recommendations:   1.  Diet as tolerated.  I encouraged the patient to eat as that will be her take it for discharge.  2.  Suggest EUS of the pancreas with FNA of pancreatic head lesion as outpatient.  3.  IV fluid hydration and symptomatic care while inpatient.    This work-up can be performed as an outpatient.  It is okay to discharge her from the GI standpoint when she is able to eat.       Norwood Levo, [place]  08/19/2021

## 2021-08-19 NOTE — Progress Notes (Signed)
4 Eyes Skin Assessment     NAME:  Amber Palmer  DATE OF BIRTH:  [birth year]  MEDICAL RECORD NUMBER:  [id number]    The patient is being assess for  Admission    I agree that 2 RN's have performed a thorough Head to Toe Skin Assessment on the patient. ALL assessment sites listed below have been assessed.      Areas assessed by both nurses:    Head, Face, Ears, Shoulders, Back, Chest, Arms, Elbows, Hands, Sacrum. Buttock, Coccyx, Ischium, Legs. Feet and Heels, and Under Medical Devices         Does the Patient have a Wound? No noted wound(s)       [place] Prevention initiated:  No   Wound Care Orders initiated:  No    Pressure Injury (Stage 3,4, Unstageable, DTI, NWPT, and Complex wounds) if present place consult order under ORDER ENTRY:: No    New and Established Ostomies if present place consult order under ORDER ENTRY: No      Nurse 1 eSignature: Electronically signed by Sanjuana Mae, RN on 08/19/21 at 6:29 AM EDT    **SHARE this note so that the co-signing nurse is able to place an eSignature**    Nurse 2 eSignature: Electronically signed by Donella Stade, RN on 08/19/21 at 6:31 AM EDT

## 2021-08-19 NOTE — Plan of Care (Signed)
Problem: Pain  Goal: Verbalizes/displays adequate comfort level or baseline comfort level  08/19/2021 2213 by Sanjuana Mae, RN  Outcome: Progressing  08/19/2021 1807 by Ginette Pitman, RN  Outcome: Progressing     Problem: Skin/Tissue Integrity  Goal: Absence of new skin breakdown  Description: 1.  Monitor for areas of redness and/or skin breakdown  2.  Assess vascular access sites hourly  3.  Every 4-6 hours minimum:  Change oxygen saturation probe site  4.  Every 4-6 hours:  If on nasal continuous positive airway pressure, respiratory therapy assess nares and determine need for appliance change or resting period.  08/19/2021 2213 by Sanjuana Mae, RN  Outcome: Progressing  08/19/2021 1807 by Ginette Pitman, RN  Outcome: Progressing     Problem: Safety - Adult  Goal: Free from fall injury  08/19/2021 2213 by Sanjuana Mae, RN  Outcome: Progressing  08/19/2021 1807 by Ginette Pitman, RN  Outcome: Progressing     Problem: ABCDS Injury Assessment  Goal: Absence of physical injury  08/19/2021 2213 by Sanjuana Mae, RN  Outcome: Progressing  08/19/2021 1807 by Ginette Pitman, RN  Outcome: Progressing     Problem: Skin/Tissue Integrity - Adult  Goal: Skin integrity remains intact  08/19/2021 2213 by Sanjuana Mae, RN  Outcome: Progressing  08/19/2021 1807 by Ginette Pitman, RN  Outcome: Progressing     Problem: Gastrointestinal - Adult  Goal: Maintains adequate nutritional intake  08/19/2021 2213 by Sanjuana Mae, RN  Outcome: Progressing  08/19/2021 1807 by Ginette Pitman, RN  Outcome: Progressing     Problem: Infection - Adult  Goal: Absence of infection at discharge  08/19/2021 2213 by Sanjuana Mae, RN  Outcome: Progressing  08/19/2021 1807 by Ginette Pitman, RN  Outcome: Progressing     Problem: Musculoskeletal - Adult  Goal: Return mobility to safest level of function  08/19/2021 2213 by Sanjuana Mae, RN  Outcome: Progressing  08/19/2021 1807 by Ginette Pitman, RN  Outcome: Progressing

## 2021-08-19 NOTE — Plan of Care (Signed)
Problem: Pain  Goal: Verbalizes/displays adequate comfort level or baseline comfort level  Outcome: Progressing     Problem: Skin/Tissue Integrity  Goal: Absence of new skin breakdown  Description: 1.  Monitor for areas of redness and/or skin breakdown  2.  Assess vascular access sites hourly  3.  Every 4-6 hours minimum:  Change oxygen saturation probe site  4.  Every 4-6 hours:  If on nasal continuous positive airway pressure, respiratory therapy assess nares and determine need for appliance change or resting period.  Outcome: Progressing     Problem: Safety - Adult  Goal: Free from fall injury  Outcome: Progressing     Problem: ABCDS Injury Assessment  Goal: Absence of physical injury  Outcome: Progressing     Problem: Skin/Tissue Integrity - Adult  Goal: Skin integrity remains intact  Outcome: Progressing     Problem: Gastrointestinal - Adult  Goal: Maintains adequate nutritional intake  Outcome: Progressing     Problem: Infection - Adult  Goal: Absence of infection at discharge  Outcome: Progressing     Problem: Musculoskeletal - Adult  Goal: Return mobility to safest level of function  Outcome: Progressing

## 2021-08-19 NOTE — Consults (Signed)
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  Upon presenting to the hospital the patient had a CT scan of the abdomen and pelvis performed which showed a hypodense lesion near the pancreatic head suspicious for intraductal papillary mucinous neoplasm for which hematology/oncology was consulted       Past Medical History:     has a past medical history of Hyperlipidemia, Hypertension, Pancreatic cancer ([place]), and Vitamin D deficiency.         Past Surgical History:    History reviewed. No pertinent surgical history.         Current Medications:     cefTRIAXone (ROCEPHIN) IV  1,000 mg IntraVENous Q24H    lisinopril  5 mg Oral Daily    sodium chloride flush  5-40 mL IntraVENous 2 times per day    enoxaparin  30 mg SubCUTAneous Daily    atorvastatin  20 mg Oral Daily           Allergies:    Allergies   Allergen Reactions    Bee Venom         Social History:    reports that she has never smoked. She has never used smokeless tobacco. She reports that she does not drink alcohol and does not use drugs.         Family History:     family history is not on file.         ROS:  14 point review of systems performed negative except for as documented in the HPI        PHYSICAL EXAM:        Vitals:    08/19/21 1337   BP: 136/71   Pulse: 80   Resp: 16    Temp: 98 F (36.7 C)   SpO2: 99%          CONSTITUTIONAL: Thin frail    EYES: No Conjunctivitis, No icterus    ENT: Normocephalic, atraumatic, No external ear deformities    NECK: No palpable adenopathy    HEMATOLOGIC/LYMPHATIC: no cervical, supraclavicular or axillary lymphadenopathy     LUNGS:  clear to auscultation no added sounds    CARDIOVASCULAR: regular rate and rhythm, normal S1 and S2, no murmur noted    ABDOMEN: Soft, Non tender, Non distended, No hepatosplenomegaly    EXTREMITIES: no LE edema             DATA:  CBC:   Lab Results   Component Value Date/Time    WBC 4.7 08/19/2021 06:29 AM    RBC 3.74 08/19/2021 06:29 AM    HGB 12.3 08/19/2021 06:29 AM    HCT 37.6 08/19/2021 06:29 AM    MCV 100.5 08/19/2021 06:29 AM    MCH 32.9 08/19/2021 06:29 AM  MCHC 32.8 08/19/2021 06:29 AM    RDW 13.2 08/19/2021 06:29 AM    PLT 97 08/19/2021 06:29 AM    MPV 10.2 08/19/2021 06:29 AM     BMP:  Lab Results   Component Value Date/Time    NA 143 08/19/2021 06:29 AM    K 4.6 08/19/2021 06:29 AM    CL 110 08/19/2021 06:29 AM    CO2 22 08/19/2021 06:29 AM    BUN 24 08/19/2021 06:29 AM    CREATININE 0.9 08/19/2021 06:29 AM    CALCIUM 9.4 08/19/2021 06:29 AM    GFRAA >60 08/15/2017 01:42 PM    LABGLOM >60 08/19/2021 06:29 AM    GLUCOSE 95 08/19/2021 06:29 AM     Magnesium:   Lab Results   Component Value Date/Time    MG 2.70 08/18/2021 04:38 PM     LIVER PROFILE:   Recent Labs     08/18/21  1638 08/19/21  0629   AST 28 19   ALT 25 18   LIPASE 487.0* 407.0*   BILIDIR  --  <0.2   BILITOT 0.8 0.5   ALKPHOS 72 56     PT/INR:    Lab Results   Component Value Date/Time    PROTIME 11.2 06/20/2016 05:24 AM    PROTIME 11.0 09/06/2012 01:05 PM    INR 0.99 06/20/2016 05:24 AM    INR 0.98 09/06/2012 01:05 PM           IMPRESSION/RECOMMENDATIONS:      88 year old female who presents to the hospital for feeling unwell and generalized weakness upon presentation CT of the abdomen and pelvis showed a possible IPMN    #?  IPMN (intraductal  papillary mucinous neoplasm)  -In evaluation of ongoing weight loss, generalized weakness she underwent CAT scan of the abdomen pelvis which showed findings suspicious for an IPMN  -MRI pancreatic protocol has been done results of which are pending  -I explained to the patient and her 2 sons who are at bedside that after the MRI we will have better characterization of the pancreatic lesion  -For the most part IPMN's are potentially malignant lesions but not in a true sense invasive cancer and a lot of times they can be just observed via serial imaging  -Either way given her advanced age of 24 years and poor functional status she would not be a candidate for any cancer directed therapy if this truly turns out to be invasive pancreatic cancer  -Both her sons at bedside agree that we should potentially just observe her and give best supportive care.  They are undecided if they even wish to undergo an invasive diagnostic procedure like an ERCP.  I instructed them to talk to GI about technical details and risks associated with ERCP      Maudie Mercury MD  [facility]

## 2021-08-20 LAB — HEPATIC FUNCTION PANEL
ALT: 17 U/L (ref 10–40)
AST: 19 U/L (ref 15–37)
Albumin: 3.6 g/dL (ref 3.4–5.0)
Alkaline Phosphatase: 55 U/L (ref 40–129)
Bilirubin, Direct: <0.2 mg/dL (ref 0.0–0.3)
Total Bilirubin: 0.5 mg/dL (ref 0.0–1.0)
Total Protein: 6.1 g/dL — ABNORMAL LOW (ref 6.4–8.2)

## 2021-08-20 LAB — CBC
Hematocrit: 36.7 % (ref 36.0–48.0)
Hemoglobin: 12 g/dL (ref 12.0–16.0)
MCH: 32.6 pg (ref 26.0–34.0)
MCHC: 32.6 g/dL (ref 31.0–36.0)
MCV: 100 fL (ref 80.0–100.0)
MPV: 10.1 fL (ref 5.0–10.5)
Platelets: 113 10*3/uL — ABNORMAL LOW (ref 135–450)
RBC: 3.67 M/uL — ABNORMAL LOW (ref 4.00–5.20)
RDW: 12.9 % (ref 12.4–15.4)
WBC: 4.4 10*3/uL (ref 4.0–11.0)

## 2021-08-20 LAB — BASIC METABOLIC PANEL
Anion Gap: 8 (ref 3–16)
BUN: 17 mg/dL (ref 7–20)
CO2: 26 mmol/L (ref 21–32)
Calcium: 9.5 mg/dL (ref 8.3–10.6)
Chloride: 111 mmol/L — ABNORMAL HIGH (ref 99–110)
Creatinine: 0.8 mg/dL (ref 0.6–1.2)
Est, Glom Filt Rate: >60 (ref >60)
Glucose: 96 mg/dL (ref 70–99)
Potassium: 4.7 mmol/L (ref 3.5–5.1)
Sodium: 145 mmol/L (ref 136–145)

## 2021-08-20 LAB — LIPASE: Lipase: 588 U/L — ABNORMAL HIGH (ref 13.0–60.0)

## 2021-08-20 LAB — CULTURE, URINE
Urine Culture, Routine: NO GROWTH
Urine Culture, Routine: NO GROWTH

## 2021-08-20 MED ORDER — CEFTRIAXONE SODIUM 1 G IJ SOLR
1 | INTRAMUSCULAR | Status: AC
Start: 2021-08-20 — End: 2021-08-21
  Administered 2021-08-21: 15:00:00 1000 mg via INTRAVENOUS

## 2021-08-20 MED ORDER — DEXTROSE-SODIUM CHLORIDE 5-0.45 % IV SOLN
5-0.45 | INTRAVENOUS | Status: DC
Start: 2021-08-20 — End: 2021-08-21
  Administered 2021-08-20 – 2021-08-21 (×2): via INTRAVENOUS

## 2021-08-20 MED FILL — CEFTRIAXONE SODIUM 1 G IJ SOLR: 1 g | INTRAMUSCULAR | Qty: 1000

## 2021-08-20 MED FILL — ATORVASTATIN CALCIUM 20 MG PO TABS: 20 mg | ORAL | Qty: 1

## 2021-08-20 MED FILL — LISINOPRIL 5 MG PO TABS: 5 mg | ORAL | Qty: 1

## 2021-08-20 MED FILL — DEXTROSE-SODIUM CHLORIDE 5-0.45 % IV SOLN: 5-0.45 % | INTRAVENOUS | Qty: 1000

## 2021-08-20 MED FILL — LOVENOX 30 MG/0.3ML IJ SOSY: 30 MG/0.3ML | INTRAMUSCULAR | Qty: 0.3

## 2021-08-20 MED FILL — HEALTHYLAX 17 G PO PACK: 17 g | ORAL | Qty: 1

## 2021-08-20 NOTE — Plan of Care (Signed)
Problem: Pain  Goal: Verbalizes/displays adequate comfort level or baseline comfort level  Outcome: Progressing     Problem: Skin/Tissue Integrity  Goal: Absence of new skin breakdown  Description: 1.  Monitor for areas of redness and/or skin breakdown  2.  Assess vascular access sites hourly  3.  Every 4-6 hours minimum:  Change oxygen saturation probe site  4.  Every 4-6 hours:  If on nasal continuous positive airway pressure, respiratory therapy assess nares and determine need for appliance change or resting period.  Outcome: Progressing     Problem: Safety - Adult  Goal: Free from fall injury  Outcome: Progressing     Problem: ABCDS Injury Assessment  Goal: Absence of physical injury  Outcome: Progressing     Problem: Skin/Tissue Integrity - Adult  Goal: Skin integrity remains intact  Outcome: Progressing     Problem: Gastrointestinal - Adult  Goal: Maintains adequate nutritional intake  Outcome: Progressing     Problem: Infection - Adult  Goal: Absence of infection at discharge  Outcome: Progressing     Problem: Musculoskeletal - Adult  Goal: Return mobility to safest level of function  Outcome: Progressing     Problem: Neurosensory - Adult  Goal: Achieves maximal functionality and self care  Outcome: Progressing

## 2021-08-20 NOTE — Progress Notes (Addendum)
Hospitalist Progress Note - [facility] ([facility])      08/20/2021 8:10 AM  Subjective and Objective:   Admit Date: 08/18/2021  PCP: Valli Glance, APRN - NP    Chief Complaint   Patient presents with    Fatigue     Pt to ED with reports of generalized weakness and decreased appetite x2-3 weeks. Pt dx with UTI and under treatment. Has had multiple abx.         ADULT DIET; Regular; Low Fat (less than or equal to 50 gm/day)     I/O last 3 completed shifts:  In: 2150 [IV Piggyback:2150]  Out: 200 [Urine:200]     Patient Vitals for the past 96 hrs (Last 3 readings):   Weight   08/18/21 2145 102 lb 9.6 oz (46.5 kg)   08/18/21 1601 126 lb (57.2 kg)     Medications:      sodium chloride        cefTRIAXone (ROCEPHIN) IV  1,000 mg IntraVENous Q24H    lisinopril  5 mg Oral Daily    sodium chloride flush  5-40 mL IntraVENous 2 times per day    enoxaparin  30 mg SubCUTAneous Daily    atorvastatin  20 mg Oral Daily       Recent Labs     08/18/21  1638 08/19/21  0629 08/20/21  0445   WBC 5.8 4.7 4.4   HGB 14.9 12.3 12.0   PLT 106* 97* 113*     Recent Labs     08/18/21  1638 08/19/21  0629 08/20/21  0445   NA 141 143 145   K 5.1 4.6 4.7   CL 105 110 111*   CO2 '22 22 26   '$ BUN 34* 24* 17   CREATININE 1.1 0.9 0.8   GLUCOSE 120* 95 96     Recent Labs     08/18/21  1638 08/19/21  0629 08/20/21  0445   AST '28 19 19   '$ ALT '25 18 17   '$ BILITOT 0.8 0.5 0.5   ALKPHOS 72 56 55     Lab Results   Component Value Date/Time    TRIG 58 06/18/2013 03:40 PM    HDL 72 06/18/2013 03:40 PM    LDLCALC 151 06/18/2013 03:40 PM    CHOL 235 06/18/2013 03:40 PM     No results found for: PHART, PO2ART, PCO2ART  No results for input(s): INR in the last 72 hours.  No results for input(s): CKTOTAL, CKMB, TROPONINI in the last 72 hours.  No results for input(s): DDIMER in the last 72 hours.  No components found for: HGBA1C  Lab Results   Component Value Date/Time    TSH 1.90 06/18/2013 03:40 PM     Urine Culture:    Results for  orders placed or performed during the hospital encounter of 08/15/17   Urine culture    Specimen: Urine, clean catch   Result Value Ref Range    Urine Culture, Routine No growth at 18-36 hours      Objective:   Vitals: BP 116/64   Pulse 83   Temp 97.4 F (36.3 C) (Oral)   Resp 16   Ht '5\' 3"'$  (1.6 m)   Wt 102 lb 9.6 oz (46.5 kg)   SpO2 97%   BMI 18.17 kg/m   Pulse Ox: SpO2  Avg: 98.6 %  Min: 97 %  Max: 100 %  Supplemental O2:  Internal Course: seen at bedside with son Cecilie Lowers    GENERAL:  alert without acute distress, frail appearing  CARDIOVASCULAR:  no rubs, no palpitations   RESPIRATORY:  no crackles, not using accessory muscles  ABDOMEN:  soft, no acute abd pain  EXTREMITIES:  no edema     CHRONIC ANATOMY/TUBES/LINES: N/A    Running Summary, Assessment, and Plan     [place] is a 88 year old female with HTN and HLD who presented with Chief Complaint of increased weakness and weight loss.  ED HR initially 106.  Cr 1.1, Lipase 487, T bili 0.8, UA suggestive of UTI.  Urine culture obtain and she was started on Rocephin.  CXR negative for acute pathology.  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  MRCP showed pancreatic head mass.  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 OP.      Clarified with patient and son Cecilie Lowers in room, DNR CCA/DNI     #Pancreatic mass, possibly benign per GI  -Hem/Onc and GI on consult  -IV Fluids  -Clear to DC by GI standpoint if tolerating diet  -Not a candidate for any cancer directed therapy per Oncology  -Currently unable to tolerate oral intake  -Palliative Care consulted  -Considering a PEG for nutrition    #Hyperchoridemia  -Trend  -IV D5 1/2 NS at 75    #UTI  -Urine culture pending  -IV Rocephin Day  #2 of 3 (5/28)     #HTN  -Continue home Lisinopril     #HLD  -Continue home Statin      #DVT prophylaxis  -Lovenox      #Disposition:  PT/OT     #Barriers to discharge:  tolerate oral Diet     Advance Directive:  DNR CCA      Atha Starks, MD  Rounding Hospitalist     Time Spent:  40 minutes     Has at least of one of these; Number and Complexity of Problems Addressed at the Encounter:  -1 or more chronic illnesses with exacerbation, progression, or side effects of treatment  -2 or more stable, chronic illnesses  -1 acute illness with systemic symptoms  -1 acute, complicated injury     Risk of Complications and/or Morbidity or Mortality of Patient Management:  -Prescription drug management

## 2021-08-20 NOTE — Progress Notes (Signed)
Gastroenterology Progress Note            Amber Palmer is a 88 y.o. female patient.  1. Generalized weakness    2. Elevated lipase    3. Hypermagnesemia    4. Elevated BUN    5. Acute cystitis with hematuria    6. Pancreatic mass        SUBJECTIVE:  Feels OK.  Doesn't want to eat.  Cant get food past tongue due to no taste for it.  She can swallow water.    Physical    VITALS:  BP 131/66   Pulse 85   Temp 98.2 F (36.8 C) (Oral)   Resp 16   Ht '5\' 3"'$  (1.6 m)   Wt 102 lb 9.6 oz (46.5 kg)   SpO2 99%   BMI 18.17 kg/m   TEMPERATURE:  Current - Temp: 98.2 F (36.8 C); Max - Temp  Avg: 97.8 F (36.6 C)  Min: 97.4 F (36.3 C)  Max: 98.2 F (36.8 C)    Abdomen soft, ND, NT, no HSM, Bowel sounds normal     Data      Recent Labs     08/18/21  1638 08/19/21  0629 08/20/21  0445   WBC 5.8 4.7 4.4   HGB 14.9 12.3 12.0   HCT 46.2 37.6 36.7   MCV 101.8* 100.5* 100.0   PLT 106* 97* 113*     Recent Labs     08/18/21  1638 08/19/21  0629 08/20/21  0445   NA 141 143 145   K 5.1 4.6 4.7   CL 105 110 111*   CO2 '22 22 26   '$ BUN 34* 24* 17   CREATININE 1.1 0.9 0.8     Recent Labs     08/18/21  1638 08/19/21  0629 08/20/21  0445   AST '28 19 19   '$ ALT '25 18 17   '$ BILIDIR  --  <0.2 <0.2   BILITOT 0.8 0.5 0.5   ALKPHOS 72 56 55     Recent Labs     08/18/21  1638 08/18/21  1739 08/19/21  0629 08/20/21  0445   LIPASE 487.0*  --  407.0* 588.0*   AMYLASE  --  256*  --   --              ASSESSMENT :    Pancreatic head mass -presenting with weakness fatigue weight loss anorexia and failure to thrive, this most likely represents a neoplasm.  The elevated serum lipase level is asymptomatic but also may suggest chronic pancreatitis as a cause of the mass (inflammatory).  IPMNs (intraductal papillary mucinous neoplasm) usually present with significant pancreatic ductal dilation if they cause a mass of this size.  The other form, sidebranch IPMN, shows small cysts.  This appears to be a solid mass and as such I am more concerned  about adenocarcinoma.     MRCP reviewed -- panc head mass is microcystic and largely unchanged from prior imaging.  Further w/u would include EUS, not ERCP, but wonder if this is cause of symptoms or should we look elsewhere?    Anorexia/Weight loss - she "can't eat" due to taste.  Difficult to get history of dysphagia or dyspepsia.  I offered her EGD and/or PEG for feeding. She will consider.      PLAN   :  1.  Diet as tolerated.  I encouraged the patient to eat as that will be her take it for discharge.  2.  Consider EGD or PEG for nutrition if desired.  If not, she is candidate for Hospice.  3.  Suggest EUS of the pancreas with FNA of pancreatic head lesion as outpatient.  4.  IV fluid hydration and symptomatic care while inpatient.    Norwood Levo, [place]  08/20/2021

## 2021-08-20 NOTE — Progress Notes (Signed)
Assessment complete. VSS. Patient resting in bed. Respirations even and easy. Oriented x3, more forgetful today than yesterday. Call light in reach. Fall precautions in place. No needs expressed at this time.

## 2021-08-21 LAB — HEPATIC FUNCTION PANEL
ALT: 17 U/L (ref 10–40)
AST: 23 U/L (ref 15–37)
Albumin: 2.5 g/dL — ABNORMAL LOW (ref 3.4–5.0)
Alkaline Phosphatase: 49 U/L (ref 40–129)
Bilirubin, Direct: <0.2 mg/dL (ref 0.0–0.3)
Total Bilirubin: 0.5 mg/dL (ref 0.0–1.0)
Total Protein: 5.7 g/dL — ABNORMAL LOW (ref 6.4–8.2)

## 2021-08-21 LAB — BASIC METABOLIC PANEL
Anion Gap: 9 (ref 3–16)
BUN: 10 mg/dL (ref 7–20)
CO2: 19 mmol/L — ABNORMAL LOW (ref 21–32)
Calcium: 9 mg/dL (ref 8.3–10.6)
Chloride: 108 mmol/L (ref 99–110)
Creatinine: 0.6 mg/dL (ref 0.6–1.2)
Est, Glom Filt Rate: >60 (ref >60)
Glucose: 138 mg/dL — ABNORMAL HIGH (ref 70–99)
Potassium: 4 mmol/L (ref 3.5–5.1)
Sodium: 136 mmol/L (ref 136–145)

## 2021-08-21 LAB — CBC
Hematocrit: 34.9 % — ABNORMAL LOW (ref 36.0–48.0)
Hemoglobin: 11.6 g/dL — ABNORMAL LOW (ref 12.0–16.0)
MCH: 33.1 pg (ref 26.0–34.0)
MCHC: 33.1 g/dL (ref 31.0–36.0)
MCV: 100 fL (ref 80.0–100.0)
MPV: 10.2 fL (ref 5.0–10.5)
Platelets: 105 10*3/uL — ABNORMAL LOW (ref 135–450)
RBC: 3.49 M/uL — ABNORMAL LOW (ref 4.00–5.20)
RDW: 13 % (ref 12.4–15.4)
WBC: 4.8 10*3/uL (ref 4.0–11.0)

## 2021-08-21 MED ORDER — LACTATED RINGERS IV SOLN
INTRAVENOUS | Status: DC
Start: 2021-08-21 — End: 2021-08-25
  Administered 2021-08-21 – 2021-08-24 (×3): via INTRAVENOUS

## 2021-08-21 MED ORDER — NORMAL SALINE FLUSH 0.9 % IV SOLN
0.9 | Freq: Two times a day (BID) | INTRAVENOUS | Status: DC
Start: 2021-08-21 — End: 2021-08-22
  Administered 2021-08-22: 02:00:00 10 mL via INTRAVENOUS

## 2021-08-21 MED ORDER — SODIUM CHLORIDE 0.9 % IV SOLN
0.9 | INTRAVENOUS | Status: DC | PRN
Start: 2021-08-21 — End: 2021-08-22

## 2021-08-21 MED ORDER — NORMAL SALINE FLUSH 0.9 % IV SOLN
0.9 | INTRAVENOUS | Status: DC | PRN
Start: 2021-08-21 — End: 2021-08-22

## 2021-08-21 MED FILL — DEXTROSE-SODIUM CHLORIDE 5-0.45 % IV SOLN: 5-0.45 % | INTRAVENOUS | Qty: 1000

## 2021-08-21 MED FILL — LISINOPRIL 5 MG PO TABS: 5 mg | ORAL | Qty: 1

## 2021-08-21 MED FILL — LOVENOX 30 MG/0.3ML IJ SOSY: 30 MG/0.3ML | INTRAMUSCULAR | Qty: 0.3

## 2021-08-21 MED FILL — ATORVASTATIN CALCIUM 20 MG PO TABS: 20 mg | ORAL | Qty: 1

## 2021-08-21 MED FILL — CEFTRIAXONE SODIUM 1 G IJ SOLR: 1 g | INTRAMUSCULAR | Qty: 1000

## 2021-08-21 NOTE — Progress Notes (Signed)
Gastroenterology Progress Note            Amber Palmer is a 88 y.o. female patient.  1. Generalized weakness    2. Elevated lipase    3. Hypermagnesemia    4. Elevated BUN    5. Acute cystitis with hematuria    6. Pancreatic mass        SUBJECTIVE:  Feels the same.  Still not eating.    Physical    VITALS:  BP 114/60   Pulse 67   Temp 98 F (36.7 C) (Oral)   Resp 18   Ht '5\' 3"'$  (1.6 m)   Wt 102 lb 9.6 oz (46.5 kg)   SpO2 99%   BMI 18.17 kg/m   TEMPERATURE:  Current - Temp: 98 F (36.7 C); Max - Temp  Avg: 98.1 F (36.7 C)  Min: 97.8 F (36.6 C)  Max: 98.3 F (36.8 C)    Abdomen soft, ND, NT, no HSM, Bowel sounds normal     Data      Recent Labs     08/19/21  0629 08/20/21  0445 08/21/21  0629   WBC 4.7 4.4 4.8   HGB 12.3 12.0 11.6*   HCT 37.6 36.7 34.9*   MCV 100.5* 100.0 100.0   PLT 97* 113* 105*     Recent Labs     08/19/21  0629 08/20/21  0445 08/21/21  0629   NA 143 145 136   K 4.6 4.7 4.0   CL 110 111* 108   CO2 22 26 19*   BUN 24* 17 10   CREATININE 0.9 0.8 0.6     Recent Labs     08/19/21  0629 08/20/21  0445 08/21/21  0629   AST '19 19 23   '$ ALT '18 17 17   '$ BILIDIR <0.2 <0.2 <0.2   BILITOT 0.5 0.5 0.5   ALKPHOS 56 55 49     Recent Labs     08/18/21  1638 08/18/21  1739 08/19/21  0629 08/20/21  0445   LIPASE 487.0*  --  407.0* 588.0*   AMYLASE  --  256*  --   --        ASSESSMENT :     Pancreatic head mass -presenting with weakness fatigue weight loss anorexia and failure to thrive, this most likely represents a neoplasm.  The elevated serum lipase level is asymptomatic but also may suggest chronic pancreatitis as a cause of the mass (inflammatory).  IPMNs (intraductal papillary mucinous neoplasm) usually present with significant pancreatic ductal dilation if they cause a mass of this size.  The other form, sidebranch IPMN, shows small cysts.  This appears to be a solid mass and as such I am more concerned about adenocarcinoma.      MRCP reviewed -- panc head mass is microcystic and  largely unchanged from prior imaging.  Further w/u would include EUS, not ERCP, but wonder if this is cause of symptoms or should we look elsewhere?     Anorexia/Weight loss - she "can't eat" due to taste.  Difficult to get history of dysphagia or dyspepsia.  I offered her EGD and/or PEG for feeding. She is willing to proceed with EGD in AM and she will discuss PEG with family.        PLAN   :  1.  Diet as tolerated.  I encouraged the patient to eat as that will be her take it for discharge.  2.  Schedule EGD in AM.  Patient will decide today if we should proceed with PEG for nutrition at same time, if EGD is unrevealing.  If not, she is candidate for Hospice.  3.  Suggest EUS of the pancreas with FNA of pancreatic head lesion as outpatient.  4.  IV fluid hydration and symptomatic care while inpatient.    Norwood Levo, [place]  08/21/2021

## 2021-08-21 NOTE — Progress Notes (Signed)
Hospitalist Progress Note - [facility] ([facility])      08/21/2021 8:45 AM  Subjective and Objective:   Admit Date: 08/18/2021  PCP: Valli Glance, APRN - NP    Chief Complaint   Patient presents with    Fatigue     Pt to ED with reports of generalized weakness and decreased appetite x2-3 weeks. Pt dx with UTI and under treatment. Has had multiple abx.         ADULT DIET; Regular; Low Fat (less than or equal to 50 gm/day)  ADULT ORAL NUTRITION SUPPLEMENT; Breakfast, Lunch, HS Snack; Standard High Calorie/High Protein Oral Supplement     I/O last 3 completed shifts:  In: 2 [P.O.:50]  Out: 200 [Urine:200]     Patient Vitals for the past 96 hrs (Last 3 readings):   Weight   08/18/21 2145 102 lb 9.6 oz (46.5 kg)   08/18/21 1601 126 lb (57.2 kg)     Medications:      dextrose 5 % and 0.45 % NaCl 75 mL/hr at 08/20/21 2328    sodium chloride        cefTRIAXone (ROCEPHIN) IV  1,000 mg IntraVENous Q24H    lisinopril  5 mg Oral Daily    sodium chloride flush  5-40 mL IntraVENous 2 times per day    enoxaparin  30 mg SubCUTAneous Daily    atorvastatin  20 mg Oral Daily       Recent Labs     08/19/21  0629 08/20/21  0445 08/21/21  0629   WBC 4.7 4.4 4.8   HGB 12.3 12.0 11.6*   PLT 97* 113* 105*     Recent Labs     08/19/21  0629 08/20/21  0445 08/21/21  0629   NA 143 145 136   K 4.6 4.7 4.0   CL 110 111* 108   CO2 22 26 19*   BUN 24* 17 10   CREATININE 0.9 0.8 0.6   GLUCOSE 95 96 138*     Recent Labs     08/19/21  0629 08/20/21  0445 08/21/21  0629   AST '19 19 23   '$ ALT '18 17 17   '$ BILITOT 0.5 0.5 0.5   ALKPHOS 56 55 49     Lab Results   Component Value Date/Time    TRIG 58 06/18/2013 03:40 PM    HDL 72 06/18/2013 03:40 PM    LDLCALC 151 06/18/2013 03:40 PM    CHOL 235 06/18/2013 03:40 PM     No results found for: PHART, PO2ART, PCO2ART  No results for input(s): INR in the last 72 hours.  No results for input(s): CKTOTAL, CKMB, TROPONINI in the last 72 hours.  No results for input(s): DDIMER in the  last 72 hours.  No components found for: HGBA1C  Lab Results   Component Value Date/Time    TSH 1.90 06/18/2013 03:40 PM     Urine Culture:    Results for orders placed or performed during the hospital encounter of 08/18/21   Culture, Urine    Specimen: Urine, clean catch   Result Value Ref Range    Urine Culture, Routine No growth at 18 to 36 hours      Objective:   Vitals: BP 114/60   Pulse 67   Temp 98 F (36.7 C) (Oral)   Resp 18   Ht '5\' 3"'$  (1.6 m)   Wt 102 lb 9.6 oz (46.5 kg)  SpO2 99%   BMI 18.17 kg/m   Pulse Ox: SpO2  Avg: 98.4 %  Min: 97 %  Max: 99 %  Supplemental O2:      Internal Course: pt wanting PEG tomorrow, 2 sons at bedside with [employer] hats on    GENERAL:  comfortable without complaints  CARDIOVASCULAR:  RRR no murmurs  RESPIRATORY:  clear without wheezes  ABDOMEN:  non-distended, soft  EXTREMITIES:  moving all spontaneously     CHRONIC ANATOMY/TUBES/LINES: N/A    Running Summary, Assessment, and Plan     Amber Palmer is a 88 year old female with HTN and HLD who presented with Chief Complaint of increased weakness and weight loss.  ED HR initially 106.  Cr 1.1, Lipase 487, T bili 0.8, UA suggestive of UTI.  Urine culture obtain and she was started on Rocephin.  CXR negative for acute pathology.  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  MRCP showed pancreatic head mass.  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 OP.  Patient deciding on PEG vs. Hospice, appears she would like PEG.     Clarified with patient and son Cecilie Lowers in room, DNR CCA/DNI, form on chart     #Pancreatic mass, possibly benign per GI  -Hem/Onc and GI on consult  -IV LR at 76  -Clear to DC by GI standpoint if tolerating diet  -Not a candidate for any  cancer directed therapy per Oncology  -Currently unable to tolerate much oral intake  -Palliative Care consulted  -Considering a PEG for nutrition, will check INR tomorrow for possible PEG     #UTI  -Urine culture no growth  -Will completed UTI treatment today day #3 then stop      #HTN  -Continue home Lisinopril     #HLD  -Continue home Statin      #DVT prophylaxis  -Lovenox      #Disposition:  PT/OT     #Barriers to discharge:  tolerate oral Diet     Advance Directive:  DNR CCA      Atha Starks, MD  Rounding Hospitalist     Time Spent:  39 minutes     Has at least of one of these; Number and Complexity of Problems Addressed at the Encounter:  -1 or more chronic illnesses with exacerbation, progression, or side effects of treatment  -2 or more stable, chronic illnesses  -1 acute illness with systemic symptoms  -1 acute, complicated injury     Risk of Complications and/or Morbidity or Mortality of Patient Management:  -Prescription drug management

## 2021-08-21 NOTE — Progress Notes (Signed)
Nutrition Note    RECOMMENDATIONS  PO Diet: con't regular diet as tolerated  ONS: Ensure TID  Nutrition Support: monitor for goals of care; RD to provide TF rec's if decision for PEG placement.      NUTRITION ASSESSMENT   Pt triggered for nutrition intervention d/t report of not being able to tolerate oral diet.  Pt is refusing ONS as well.  Family at bedside & states this began just a week or two ago.  MD has addressed need for PEG placement vs hospice as a mass was noted on head of pancreas.  Wt hx indicates 24 lb wt loss in past 2 weeks; if accurate, this is significant per ASPEN guidelines.  Will monitor for goals of care & offer TF rec's if aggressive tx desired.     Nutrition Related Findings: No edema noted; LBM 5/22 per EMR; labs reviewed.  Wounds: None  Nutrition Education:  Education not indicated   Nutrition Goals: PO intake 50% or greater     MALNUTRITION ASSESSMENT   Acute Illness  Malnutrition Status: Severe malnutrition  Findings of the 6 clinical characteristics of malnutrition:  Energy Intake:  50% or less of estimated energy requirements for 5 or more days  Weight Loss:  Greater than 5% over 1 month (19% per EMR)     Body Fat Loss:  Moderate body fat loss Orbital, Buccal region   Muscle Mass Loss:  Moderate muscle mass loss Temples (temporalis), Clavicles (pectoralis & deltoids)  Fluid Accumulation:  No significant fluid accumulation    Grip Strength:  Not Performed      NUTRITION DIAGNOSIS   Severe malnutrition related to catabolic illness, inadequate protein-energy intake as evidenced by intake 0-25%, poor intake prior to admission, weight loss, Criteria as identified in malnutrition assessment      CURRENT NUTRITION THERAPIES  ADULT DIET; Regular; Low Fat (less than or equal to 50 gm/day)  ADULT ORAL NUTRITION SUPPLEMENT; Breakfast, Lunch, HS Snack; Standard High Calorie/High Protein Oral Supplement     PO Intake: 0%, 1-25%   PO Supplement Intake:0%    ANTHROPOMETRICS  Current Height: '5\' 3"'$  (160  cm)  Current Weight - Scale: 102 lb 9.6 oz (46.5 kg)    Ideal Body Weight (IBW): 115 lbs  (52 kg)        BMI: 18.1      COMPARATIVE STANDARDS  Energy (kcal):  1410- 1645     Protein (g):  71- 94g       Fluid (mL/day):  1 ml/kcal    The patient will be monitored per nutrition standards of care. Consult dietitian if additional nutrition interventions are needed prior to RD reassessment.     Vincenza Hews, RD, LD    Contact: [phone]

## 2021-08-21 NOTE — Progress Notes (Signed)
Occupational/Physical Therapy  Corinna Lines    OT/PT orders received. Per chart, hospice consult is pending. Will f/u with pt tomorrow pending decision on hospice and as pt is medically appropriate.    Thanks,  Bearl Mulberry, MOT, OTR/L [id number]  Valorie Roosevelt, PT, DPT, OMT-C

## 2021-08-21 NOTE — Progress Notes (Addendum)
Dr Dorene Ar paged regarding code status paperwork that needs completed. MD read message.

## 2021-08-22 ENCOUNTER — Inpatient Hospital Stay: Admit: 2021-08-22 | Primary: Nurse Practitioner

## 2021-08-22 LAB — CBC
Hematocrit: 35.6 % — ABNORMAL LOW (ref 36.0–48.0)
Hemoglobin: 11.7 g/dL — ABNORMAL LOW (ref 12.0–16.0)
MCH: 32.7 pg (ref 26.0–34.0)
MCHC: 33 g/dL (ref 31.0–36.0)
MCV: 99.1 fL (ref 80.0–100.0)
MPV: 9.8 fL (ref 5.0–10.5)
Platelets: 125 10*3/uL — ABNORMAL LOW (ref 135–450)
RBC: 3.59 M/uL — ABNORMAL LOW (ref 4.00–5.20)
RDW: 12.8 % (ref 12.4–15.4)
WBC: 5 10*3/uL (ref 4.0–11.0)

## 2021-08-22 LAB — COMPREHENSIVE METABOLIC PANEL
ALT: 16 U/L (ref 10–40)
AST: 19 U/L (ref 15–37)
Albumin/Globulin Ratio: 1 — ABNORMAL LOW (ref 1.1–2.2)
Albumin: 2.9 g/dL — ABNORMAL LOW (ref 3.4–5.0)
Alkaline Phosphatase: 47 U/L (ref 40–129)
Anion Gap: 6 (ref 3–16)
BUN: 7 mg/dL (ref 7–20)
CO2: 27 mmol/L (ref 21–32)
Calcium: 9.3 mg/dL (ref 8.3–10.6)
Chloride: 104 mmol/L (ref 99–110)
Creatinine: 0.7 mg/dL (ref 0.6–1.2)
Est, Glom Filt Rate: >60 (ref >60)
Glucose: 103 mg/dL — ABNORMAL HIGH (ref 70–99)
Potassium: 4 mmol/L (ref 3.5–5.1)
Sodium: 137 mmol/L (ref 136–145)
Total Bilirubin: 0.5 mg/dL (ref 0.0–1.0)
Total Protein: 5.7 g/dL — ABNORMAL LOW (ref 6.4–8.2)

## 2021-08-22 LAB — PROTIME-INR
INR: 1.05 (ref 0.84–1.16)
Protime: 13.7 s (ref 11.5–14.8)

## 2021-08-22 MED ORDER — LIDOCAINE HCL (PF) 2 % IJ SOLN
2 % | INTRAMUSCULAR | Status: DC | PRN
Start: 2021-08-22 — End: 2021-08-22
  Administered 2021-08-22: 19:00:00 70 via INTRAVENOUS

## 2021-08-22 MED ORDER — CEFAZOLIN SODIUM 1 G IJ SOLR
1 g | INTRAMUSCULAR | Status: DC | PRN
Start: 2021-08-22 — End: 2021-08-22
  Administered 2021-08-22: 19:00:00 2 via INTRAVENOUS

## 2021-08-22 MED ORDER — CEFAZOLIN SODIUM 2 G IJ SOLR
2 | INTRAMUSCULAR | Status: AC
Start: 2021-08-22 — End: 2021-08-22
  Administered 2021-08-22: 17:00:00 2000 mg via INTRAVENOUS

## 2021-08-22 MED ORDER — PROPOFOL 1000 MG/100ML IV EMUL
1000 MG/100ML | INTRAVENOUS | Status: DC | PRN
Start: 2021-08-22 — End: 2021-08-22
  Administered 2021-08-22: 19:00:00 150 via INTRAVENOUS

## 2021-08-22 MED ORDER — ENOXAPARIN SODIUM 30 MG/0.3ML IJ SOSY
30 | Freq: Every day | INTRAMUSCULAR | Status: DC
Start: 2021-08-22 — End: 2021-08-25
  Administered 2021-08-24 – 2021-08-25 (×2): 30 mg via SUBCUTANEOUS

## 2021-08-22 MED ORDER — PROPOFOL 200 MG/20ML IV EMUL
200 MG/20ML | INTRAVENOUS | Status: DC | PRN
Start: 2021-08-22 — End: 2021-08-22
  Administered 2021-08-22: 19:00:00 30 via INTRAVENOUS
  Administered 2021-08-22 (×2): 25 via INTRAVENOUS
  Administered 2021-08-22: 19:00:00 30 via INTRAVENOUS

## 2021-08-22 MED FILL — CEFAZOLIN SODIUM 2 G IJ SOLR: 2 g | INTRAMUSCULAR | Qty: 2000

## 2021-08-22 NOTE — Plan of Care (Signed)
Problem: Pain  Goal: Verbalizes/displays adequate comfort level or baseline comfort level  Outcome: Progressing     Problem: Skin/Tissue Integrity  Goal: Absence of new skin breakdown  Description: 1.  Monitor for areas of redness and/or skin breakdown  2.  Assess vascular access sites hourly  3.  Every 4-6 hours minimum:  Change oxygen saturation probe site  4.  Every 4-6 hours:  If on nasal continuous positive airway pressure, respiratory therapy assess nares and determine need for appliance change or resting period.  Outcome: Progressing     Problem: Safety - Adult  Goal: Free from fall injury  Outcome: Progressing     Problem: ABCDS Injury Assessment  Goal: Absence of physical injury  Outcome: Progressing     Problem: Skin/Tissue Integrity - Adult  Goal: Skin integrity remains intact  Outcome: Progressing     Problem: Gastrointestinal - Adult  Goal: Maintains adequate nutritional intake  Outcome: Progressing     Problem: Infection - Adult  Goal: Absence of infection at discharge  Outcome: Progressing     Problem: Musculoskeletal - Adult  Goal: Return mobility to safest level of function  Outcome: Progressing     Problem: Neurosensory - Adult  Goal: Achieves maximal functionality and self care  Outcome: Progressing     Problem: Nutrition Deficit:  Goal: Optimize nutritional status  Outcome: Progressing

## 2021-08-22 NOTE — Progress Notes (Signed)
Teaching / education initiated regarding perioperative experience, expectations, and pain management during stay. Patient verbalized understanding.  Electronically signed by Titus Dubin, RN on 08/22/2021 at 1:48 PM

## 2021-08-22 NOTE — Consults (Signed)
PALLIATIVE MEDICINE CONSULTATION     Patient name:Amber Palmer   MVH:[id number]    DOB:[birth year]  Room/Bed:5TN-5579/5579-01   LOS: 4 days         Date of consult:08/22/2021    Consult Information  Palliative Medicine Consult performed by: Wyn Forster, APRN - CNP  CNP    Inpatient consult to Palliative Care  Consult performed by: Wyn Forster, APRN - CNP  Consult ordered by: Elliot Gurney, MD  Reason for consult: GOC and code status           ASSESSMENT/RECOMMENDATIONS     88 y.o. female with Poor appetite and pancreatic mass      Symptom Management:  Pancreatic Mass- difficulty eating with poor appetite not planning to pursue cancer Tx per chart review.    Goals of Care- talked to pt and son [place], both agree that they want to pursue PEG tube as an option for nutrition with the goal being to prolong pts life if possible. Pt is DNRCCA at baseline. WE discussed briefly Hospice support at DC if plan if for no cancer Tx will continue to follow along and support pt and family if time allows.     Patient/Family Goals of Care :    talked to pt and son [place], both agree that they want to pursue PEG tube as an option for nutrition with the goal being to prolong pts life if possible. Pt is DNRCCA at baseline. WE discussed briefly Hospice support at DC if plan if for no cancer Tx will continue to follow along and support pt and family if time allows.     Disposition/Discharge Plan:   pendnig    Advance Directives:      The patient has the following advanced directives on file:  Advance Directives       Power of Attorney Living Will ACP-Advance Directive ACP-Power of Attorney    Not on File Not on File Not on File Not on File            The patient has appointed the following active healthcare agents:  Antonio-son    The Patient has the following current code status:    Code Status: DNR-CCA      Interactive exchange regarding medications,tests and procedures with: patient, floor RN, Dr Johny Blamer  Thank you for  allowing [place] to participate in the care of this patient.      HISTORY     CC: poor appetite  HPI: The patient is a 88 y.o. female with past medical history of HTN who presents with Pancreatic cancer with weight loss     Palliative Medicine SymptomScreening/ROS:    Review of Systems   Constitutional:  Positive for appetite change and fatigue.   Respiratory:  Positive for shortness of breath.    Gastrointestinal:  Positive for abdominal distention and abdominal pain.   Skin:  Positive for pallor.   Neurological:  Positive for weakness.   All other systems reviewed and are negative.          Palliative Performance Scale:     []  60%  Amb reduced; Sig dz. Can't do hobbies/housework; Intake normal or reduced, Occasional assist; LOC full/confusion   [x]  50%  Mainly sit/lie; Extensive disease. Mainly assist, Intake normal or reduced; Occasional assist; LOC full/confusion   []  40%  Mainly in bed; Extensive disease; Mainly assist; Intake normal or reduced; Occasional assist; LOC full/confusion   []  30%  Bed bound, Extensive disease; Total care; Intake reduced; LOC full/confusion   []   20%  Bed bound; Extensive disease; Total care; Intake minimal; Drowsy/coma   []  10%  Bed bound; Extensive disease; Total care; Mouth care only; Drowsy/coma   []   0%   Death       Home med list and hospital medications reviewed in chart as of 08/22/2021     EXAM     Vitals:    08/22/21 0506   BP: 137/68   Pulse: 73   Resp: 18   Temp: 98.3 F (36.8 C)   SpO2: 99%       Physical Exam  Constitutional:       Appearance: She is ill-appearing.   HENT:      Head: Normocephalic and atraumatic.      Nose: No congestion.      Mouth/Throat:      Mouth: Mucous membranes are dry.   Eyes:      Pupils: Pupils are equal, round, and reactive to light.   Cardiovascular:      Rate and Rhythm: Normal rate.      Heart sounds: No murmur heard.    No friction rub. No gallop.   Pulmonary:      Effort: No respiratory distress.   Abdominal:      Palpations: Abdomen is soft.    Musculoskeletal:      Cervical back: Normal range of motion.      Right lower leg: Edema present.      Left lower leg: Edema present.   Skin:     General: Skin is warm and dry.      Coloration: Skin is pale.   Neurological:      General: No focal deficit present.      Mental Status: She is alert. She is disoriented.      Comments: Disoriented to time    Psychiatric:         Mood and Affect: Mood normal.         Behavior: Behavior normal.              OBJECTIVE   BP 137/68   Pulse 73   Temp 98.3 F (36.8 C) (Oral)   Resp 18   Ht 5\' 3"  (1.6 m)   Wt 102 lb 9.6 oz (46.5 kg)   SpO2 99%   BMI 18.17 kg/m   I/O last 3 completed shifts:  In: 100 [P.O.:100]  Out: 300 [Urine:300]  No intake/output data recorded.      Palliative Medicine Interventions:    patient/family support  Goals of Care discussions with patient/surrogate  Spiritual Interventions: none         DATA:  Current labs in the epic chart reviewed as of 08/22/2021   Review of previous notes, admits, labs, radiology and testing relevant to this consult done in this chart today 08/22/2021    Medical Decision Making:  The following items were considered in medical decision making:  Review of prior external note(s) from each unique source relevant to today's visit: Hospitalist, Case management, Oncology  Discussion of management or test with external physician/qualified health care professional: Hospitalist, Case management  Unique test results reviewed: CBC and BMP, imaging       Risk of Complications/Morbidity: High   Illness(es)/ Infection present that pose threat to bodily function.   There is potential for severe exacerbation of condition/side effects of treatment.  Therapy requires intensive monitoring for toxicity     Advanced Care Planning Note.     Purpose of Encounter: Advanced care planning  Parties In Attendance: Patient and son Antonio  Decisional Capacity: FULL  Subjective:Patient is weak and tired  Objective: weight loss  Goals of Care  Determination: Patient wants full support until arrest then (NO CPR, vent, surgery, HD, trach, PEG)  Plan:  Palliative care consult  The Patient has the following current code status:    Code Status: DNR-CCA  Time spent on Advanced care Planning: 20 minutes  Advanced Care Planning Documents: Completed advanced directives on chart, Antonio is the POA.                 Signed By: Electronically signed by Wyn Forster, APRN - CNP on 08/22/2021 at 8:27 AM  Palliative Medicine     Aug 22, 2021

## 2021-08-22 NOTE — Anesthesia Pre-Procedure Evaluation (Signed)
Department of Anesthesiology  Preprocedure Note       Name:  Amber Palmer   Age:  88 y.o.  DOB:  06/13/1929                                          MRN:  [id number]         Date:  08/22/2021      Surgeon: Juliann Mule):  Waynard Reeds, MD    Procedure: Procedure(s):  EGD PEG TUBE PLACEMENT    Medications prior to admission:   Prior to Admission medications    Medication Sig Start Date End Date Taking? Authorizing Provider   Ascorbic Acid (VITAMIN C) 250 MG tablet Take 1 tablet by mouth daily    Historical Provider, MD   tamsulosin (FLOMAX) 0.4 MG capsule Take 1 capsule by mouth daily for 5 doses 07/20/17 07/25/17  Eleanora Neighbor, PA-C   naproxen (NAPROSYN) 250 MG tablet Take 1 tablet by mouth 2 times daily (with meals) for 10 days 07/11/17 07/21/17  Earley Abide, MD   Calcium Ascorbate 500 MG TABS Take 500 mg by mouth 02/11/09   Historical Provider, MD   vitamin B-1 (THIAMINE) 100 MG tablet Take 1 tablet by mouth daily    Historical Provider, MD   vitamin E 1000 units capsule Take 1 capsule by mouth daily    Historical Provider, MD   calcium carbonate (OSCAL) 500 MG TABS tablet Take 1 tablet by mouth daily    Historical Provider, MD   lisinopril (PRINIVIL;ZESTRIL) 20 MG tablet Take 1 tablet by mouth daily 12/10/13   Ladona Horns, MD   atorvastatin (LIPITOR) 20 MG tablet Take 1 tablet by mouth daily 12/10/13   Ladona Horns, MD   vitamin D (CHOLECALCIFEROL) 50000 UNIT CAPS Take 1 capsule by mouth once a week  Patient taking differently: Take 1 capsule by mouth once a week Last taken on 5/26 12/10/13   Ladona Horns, MD       Current medications:    Current Facility-Administered Medications   Medication Dose Route Frequency Provider Last Rate Last Admin   . ceFAZolin (ANCEF) 2,000 mg in sodium chloride 0.9 % 50 mL IVPB (mini-bag)  2,000 mg IntraVENous On Call to [place], PA-C 100 mL/hr at 08/22/21 1316 2,000 mg at 08/22/21 1316   . lactated ringers IV soln infusion   IntraVENous Continuous Atha Starks, MD 75 mL/hr at 08/21/21 1038 New Bag at 08/21/21 1038   . sodium chloride flush 0.9 % injection 5-40 mL  5-40 mL IntraVENous 2 times per day Norwood Levo, MD   10 mL at 08/21/21 2133   . sodium chloride flush 0.9 % injection 5-40 mL  5-40 mL IntraVENous PRN Norwood Levo, MD       . 0.9 % sodium chloride infusion  25 mL IntraVENous PRN Norwood Levo, MD       . lisinopril (PRINIVIL;ZESTRIL) tablet 5 mg  5 mg Oral Daily Sheryn Bison, MD   5 mg at 08/20/21 0757   . sodium chloride flush 0.9 % injection 5-40 mL  5-40 mL IntraVENous 2 times per day Sheryn Bison, MD   10 mL at 08/22/21 1308   . sodium chloride flush 0.9 % injection 5-40 mL  5-40 mL IntraVENous PRN Sheryn Bison, MD       . 0.9 %  sodium chloride infusion   IntraVENous PRN Sheryn Bison, MD 20 mL/hr at 08/22/21 1315 New Bag at 08/22/21 1315   . enoxaparin Sodium (LOVENOX) injection 30 mg  30 mg SubCUTAneous Daily Gifford Shave Stiverson, PA-C   30 mg at 08/21/21 1041   . ondansetron (ZOFRAN-ODT) disintegrating tablet 4 mg  4 mg Oral Q8H PRN Sheryn Bison, MD        Or   . ondansetron (ZOFRAN) injection 4 mg  4 mg IntraVENous Q6H PRN Sheryn Bison, MD       . polyethylene glycol (GLYCOLAX) packet 17 g  17 g Oral Daily PRN Sheryn Bison, MD   17 g at 08/20/21 1835   . acetaminophen (TYLENOL) tablet 650 mg  650 mg Oral Q6H PRN Sheryn Bison, MD        Or   . acetaminophen (TYLENOL) suppository 650 mg  650 mg Rectal Q6H PRN Sheryn Bison, MD       . atorvastatin (LIPITOR) tablet 20 mg  20 mg Oral Daily Sheryn Bison, MD   20 mg at 08/21/21 1042       Allergies:    Allergies   Allergen Reactions   . Bee Venom        Problem List:    Patient Active Problem List   Diagnosis Code   . Hypertension I10   . Hyperlipidemia E78.5   . Vitamin D deficiency E55.9   . Pancreatic cancer ([place]) C25.9   . Failure to thrive in adult R62.7   . Severe malnutrition ([place]) E43   . Weight loss R63.4   . Elevated lipase R74.8   . UTI (urinary tract infection) N39.0        Past Medical History:        Diagnosis Date   . Hyperlipidemia    . Hypertension    . Pancreatic cancer ([place]) 08/18/2021   . Vitamin D deficiency        Past Surgical History:  History reviewed. No pertinent surgical history.    Social History:    Social History     Tobacco Use   . Smoking status: Never   . Smokeless tobacco: Never   Substance Use Topics   . Alcohol use: No                                Counseling given: Not Answered      Vital Signs (Current):   Vitals:    08/21/21 1715 08/21/21 2130 08/22/21 0506 08/22/21 1142   BP: 119/75 119/63 137/68 116/68   Pulse: 79 74 73 83   Resp: _0 Temp: 98.1 F (36.7 C) 97.9 F (36.6 C) 98.3 F (36.8 C) 98.1 F (36.7 C)   TempSrc: Oral Oral Oral Oral   SpO2: 100% 97% 99% 98%   Weight:       Height:                                                  BP Readings from Last 3 Encounters:   08/22/21 116/68   09/30/19 (!) 117/105   08/15/17 (!) 145/55       NPO Status:  BMI:   Wt Readings from Last 3 Encounters:   08/18/21 102 lb 9.6 oz (46.5 kg)   08/15/17 116 lb (52.6 kg)   07/20/17 117 lb (53.1 kg)     Body mass index is 18.17 kg/m.    CBC:   Lab Results   Component Value Date/Time    WBC 5.0 08/22/2021 05:54 AM    RBC 3.59 08/22/2021 05:54 AM    HGB 11.7 08/22/2021 05:54 AM    HCT 35.6 08/22/2021 05:54 AM    MCV 99.1 08/22/2021 05:54 AM    RDW 12.8 08/22/2021 05:54 AM    PLT 125 08/22/2021 05:54 AM       CMP:   Lab Results   Component Value Date/Time    NA 137 08/22/2021 05:54 AM    K 4.0 08/22/2021 05:54 AM    CL 104 08/22/2021 05:54 AM    CO2 27 08/22/2021 05:54 AM    BUN 7 08/22/2021 05:54 AM    CREATININE 0.7 08/22/2021 05:54 AM    GFRAA >60 08/15/2017 01:42 PM    AGRATIO 1.0 08/22/2021 05:54 AM    LABGLOM >60 08/22/2021 05:54 AM    GLUCOSE 103 08/22/2021 05:54 AM    PROT 5.7 08/22/2021 05:54 AM    CALCIUM 9.3 08/22/2021 05:54 AM    BILITOT 0.5 08/22/2021 05:54 AM     ALKPHOS 47 08/22/2021 05:54 AM    AST 19 08/22/2021 05:54 AM    ALT 16 08/22/2021 05:54 AM       POC Tests: No results for input(s): POCGLU, POCNA, POCK, POCCL, POCBUN, POCHEMO, POCHCT in the last 72 hours.    Coags:   Lab Results   Component Value Date/Time    PROTIME 13.7 08/22/2021 05:54 AM    INR 1.05 08/22/2021 05:54 AM    APTT 23.7 06/20/2016 05:24 AM       HCG (If Applicable): No results found for: PREGTESTUR, PREGSERUM, HCG, HCGQUANT     ABGs: No results found for: PHART, PO2ART, PCO2ART, HCO3ART, BEART, O2SATART     Type & Screen (If Applicable):  No results found for: LABABO, LABRH    Drug/Infectious Status (If Applicable):  No results found for: HIV, HEPCAB    COVID-19 Screening (If Applicable): No results found for: COVID19        Anesthesia Evaluation  Patient summary reviewed and Nursing notes reviewed no history of anesthetic complications:   Airway: Mallampati: I  TM distance: >3 FB   Neck ROM: full  Mouth opening: > = 3 FB   Dental: normal exam         Pulmonary:       (-) asthma and shortness of breath                           Cardiovascular:    (+) hypertension:, hyperlipidemia    (-)  angina          Echocardiogram reviewed               ROS comment: 6/18    Study Conclusions     - Left ventricle: The cavity size was normal. Systolic function was   normal. The estimated ejection fraction was in the range of 55%   to 60%. Wall motion was normal; there were noregional wall   motion abnormalities. Left ventricular diastolic function   parameters were normal.   - Aortic valve: Moderate focal thickening and calcification   involving the  noncoronary cusp. Valve area (VTI): 2.11 cm^2.   Valve area (Vmax): 2.3 cm^2. Valve area (Vmean): 2.15 cm^2.   - Mitral valve: Mild calcification. There was mild regurgitation.        Neuro/Psych:      (-) CVA           GI/Hepatic/Renal:   (+) renal disease (UTI s/p abx):,      (-) GERD and liver disease      ROS comment: Severe malnutrition.    Endo/Other:    (+) malignancy/cancer.    (-) diabetes mellitus, hypothyroidism               Abdominal:             Vascular:     - PVD.      Other Findings:           Anesthesia Plan      MAC     ASA 4       Induction: intravenous.      Anesthetic plan and risks discussed with patient.    Use of blood products discussed with child/children whom did not consent to blood products. Special considerations: Jehovah's Witness.   Plan discussed with CRNA.                    Cecile Hearing, MD   08/22/2021

## 2021-08-22 NOTE — Progress Notes (Signed)
Returned to floor. VSS. Report to [employer].

## 2021-08-22 NOTE — Progress Notes (Signed)
No blood for religious reasons per patient and son Cecilie Lowers.

## 2021-08-22 NOTE — Progress Notes (Signed)
Awake & alert. Respirations easy & symmetrical. Abdomen soft. Peg tube clamped & taped in place with dressing. Denies pain. Transferred to floor.

## 2021-08-22 NOTE — Progress Notes (Addendum)
Gastroenterology Progress Note            Eulalah Rupert is a 88 y.o. female patient.  1. Generalized weakness    2. Elevated lipase    3. Hypermagnesemia    4. Elevated BUN    5. Acute cystitis with hematuria    6. Pancreatic mass        SUBJECTIVE:  Feels the same.  Still not eating. No abdominal pain.     Physical    VITALS:  BP 137/68   Pulse 73   Temp 98.3 F (36.8 C) (Oral)   Resp 18   Ht '5\' 3"'$  (1.6 m)   Wt 102 lb 9.6 oz (46.5 kg)   SpO2 99%   BMI 18.17 kg/m   TEMPERATURE:  Current - Temp: 98.3 F (36.8 C); Max - Temp  Avg: 98.1 F (36.7 C)  Min: 97.9 F (36.6 C)  Max: 98.3 F (36.8 C)    NAD  RRR  Lungs CTA bilat  Abdomen soft, ND, NT, no HSM, Bowel sounds normal     Data      Recent Labs     08/20/21  0445 08/21/21  0629 08/22/21  0554   WBC 4.4 4.8 5.0   HGB 12.0 11.6* 11.7*   HCT 36.7 34.9* 35.6*   MCV 100.0 100.0 99.1   PLT 113* 105* 125*       Recent Labs     08/20/21  0445 08/21/21  0629 08/22/21  0554   NA 145 136 137   K 4.7 4.0 4.0   CL 111* 108 104   CO2 26 19* 27   BUN '17 10 7   '$ CREATININE 0.8 0.6 0.7       Recent Labs     08/20/21  0445 08/21/21  0629 08/22/21  0554   AST '19 23 19   '$ ALT '17 17 16   '$ BILIDIR <0.2 <0.2  --    BILITOT 0.5 0.5 0.5   ALKPHOS 55 49 47       Recent Labs     08/20/21  0445   LIPASE 588.0*         ASSESSMENT :     Pancreatic head mass -presenting with weakness fatigue weight loss anorexia and failure to thrive, this most likely represents a neoplasm.  The elevated serum lipase level is asymptomatic but also may suggest chronic pancreatitis as a cause of the mass (inflammatory).  IPMNs (intraductal papillary mucinous neoplasm) usually present with significant pancreatic ductal dilation if they cause a mass of this size.  The other form, sidebranch IPMN, shows small cysts.  This appears to be a solid mass and as such I am more concerned about adenocarcinoma.      MRCP reviewed -- panc head mass is microcystic and largely unchanged from prior imaging  (2019).  Further w/u would include EUS but wonder if this is cause of symptoms or should we look elsewhere?     Anorexia/Weight loss - she "can't eat" due to taste.  Denies dysphagia.  Pt and family have been offered EGD and/or PEG for feeding. She is willing to proceed with EGD today but is undecided on PEG and would like input from her son, Cecilie Lowers.         PLAN   :  1.  NPO for EGD today  2.  Message left with her son, Cecilie Lowers, to call back to discuss if they wish to proceed with PEG placement today for nutrition  at same time if EGD is unrevealing.    3.  Suggest EUS of the pancreas with FNA of pancreatic head lesion as outpatient.  4.  IV fluid hydration and symptomatic care while inpatient.  5.   palliative care eval is pending    Discussed with Dr. Katherina Mires, PA-C  [facility]    I have personally performed a face to face diagnostic evaluation on this patient.  I have interviewed and examined the patient and I agree with the findings and recommended plan of care.  In summary, my findings and plan are the following: poor appetite, per [place] d/w pt/family/palliative care a peg is requested, abd soft, pt agreeable to peg, plan egd/peg placement, oupt eus pancreas.       Geannie Risen, MD  [facility]

## 2021-08-22 NOTE — Progress Notes (Signed)
[place] Department   Phone: [phone]    Occupational Therapy    '[x]'$  Initial Evaluation            '[]'$  Daily Treatment Note         '[]'$  Discharge Summary      Patient: Amber Palmer   DOB: [birth year]   MRN: [id number]   Date of Service:  08/22/2021    Admitting Diagnosis: Pancreatic cancer [facility])  Current Admission Summary: Pt is a 88 y/o. Female who presents with generalized weakness.  Past Medical History:  has a past medical history of Hyperlipidemia, Hypertension, Pancreatic cancer ([place]), and Vitamin D deficiency.  Past Surgical History:  has no past surgical history on file.  Discharge Recommendations: Amber Palmer scored a 16/24 on the AM-PAC ADL Inpatient form. Current research shows that an AM-PAC score of 17 or less is typically not associated with a discharge to the patient's home setting. Based on the patient's AM-PAC score and their current ADL deficits, it is recommended that the patient have 3-5 sessions per week of Occupational Therapy at d/c to increase the patient's independence.  Please see assessment section for further patient specific details.    If patient discharges prior to next session this note will serve as a discharge summary.  Please see below for the latest assessment towards goals.     DME Required For Discharge: rolling walker  Precautions/Restrictions: high fall risk  Weight Bearing Restrictions: no restrictions     Required Braces/Orthotics: no braces required                     Positional Restrictions:no positional restrictions     Pre-Admission Information   Lives With: son; one son is home all the time while the other works                         Type of Home: house  Home Layout: one level  Home Access: level entry  [place]: tub/shower unit  [employer]:  No equipment in bathroom; Pt does not use the shower; she has been using a bucket & sponge bath for years  Toilet Height: standard height  Home Equipment:  Pt states her son  used a rolling walker/cane, and they still have it but he is very tall and not a height for her  Transfer Assistance: Independent without use of device  Ambulation Assistance:Independent without use of device  ADL Assistance: independent with all ADL's  IADL Assistance: requires assistance with all homemaking tasks, Pt states her sons do most of the household chores, she "does not do anything "  Active Driver:        '[]'$  Yes                 '[x]'$  No  Hand Dominance: '[]'$  Left                 '[x]'$  Right  Current Employment: retired.  Occupation: Nurse's aid  Hobbies: watching TV, "I can't do much at my age"   Recent Falls: No falls     Examination   Vision:   Vision Corrective Device: wears glasses for reading  Hearing:   WFL  Posture:   Pt demonstrates a very forward head and tends to look down frequently. The pt does not look up unless verbally cued to do so. Pt has a kyphotic posture and a very narrow BOS when standing.  Sensation:  WFL and denies numbness and tingling  Proprioception:    WFL  Tone:   Normotonic  Coordination Testing:   WFL    ROM:   (B) UE AROM WFL  Strength:   (B) UE strength grossly WFL    Therapist Clinical Decision Making (Complexity): medium complexity  Clinical Presentation: evolving      Subjective  General: Patient supine in bed, agreeable to evaluation  Pain: 0/10  Pain Interventions: not applicable        Activities of Daily Living  Basic Activities of Daily Living  Grooming: setup assistance stand by assistance  Upper Extremity Bathing: stand by assistance  Lower Extremity Bathing: minimal assistance   Upper Extremity Dressing: stand by assistance  Lower Extremity Dressing: moderate assistance maximum assistance  Toileting: minimal assistance.    Toileting Comments: urination of toilet, depends and external female catheter replaced  General Comments: required sitting at sink due to fatigue and decreased balance  Instrumental Activities of Daily Living  No IADL completed on this  date.    Functional Mobility  Bed Mobility  Supine to Sit: stand by assistance  Comments:Increased time, HOB elevated, bed rail   Transfers  Sit to stand transfer:minimal assistance  Stand to sit transfer: minimal assistance  Bed / Chair transfer: minimal assistance.    Bed / Chair equipment: rolling walker  Bed / Chair comments: small BOS, posterior lean, cues for safety, patient initially with B HHA, exited RR with RW for safety  Toilet transfer: minimal assistance  Toilet transfer equipment: standard toilet  Toilet transfer comments: R hand rail  Comments:  Functional Mobility:  Standing Balance: contact guard assistance, minimal assistance.    Standing Balance Comment: posterior lean  Functional Mobility: .  contact guard assistance, minimal assistance  Functional Mobility Device Use: rolling walker  Functional Mobility Comment: ~8 feet + 56 feet with CGA to minA , use of RW. Patient with decreased cadence, small BOS with RLE scissoring occasionally over left. Patient responds to verbal cues to quicken pace and increase step length, but poor carryover, forward flexed posture    Other Therapeutic Interventions    Functional Outcomes  AM-PAC Inpatient Daily Activity Raw Score: 16    Cognition  Overall Cognitive Status: Impaired  Memory: decreased recall of recent events, decreased short term memory  Safety Judgement: decreased awareness of need for assistance  Problem Solving: decreased awareness of errors, assistance required to identify errors made, assistance required to correct errors made  Insights: decreased awareness of deficits  Initiation: requires cues for some  Sequencing: requires cues for some  Orientation:    alert and oriented x 4  Command Following:   [facility]     Education  Barriers To Learning: cognition  Patient Education: patient educated on goals, OT role and benefits, plan of care, discharge recommendations  Learning Assessment:  patient verbalizes understanding, would benefit from continued  reinforcement    Assessment  Activity Tolerance: Tolerated well  Impairments Requiring Therapeutic Intervention: decreased functional mobility, decreased ADL status, decreased safety awareness, decreased endurance, decreased balance, decreased IADL, decreased posture  Prognosis: good  Clinical Assessment: Patient presents with the above deficits, which are below baseline, and would benefit from continued skilled OT to address  Safety Interventions: patient left in chair, chair alarm in place, call light within reach, patient at risk for falls, and nurse notified    Plan  Frequency: 3-5 x/per week  Current Treatment Recommendations: strengthening, balance training, functional mobility training, transfer training, endurance training, patient/caregiver  education, ADL/self-care training, safety education, and equipment evaluation/education    Goals  Patient Goals: Return home   Short Term Goals:  Time Frame: Upon discharge  Patient will complete upper body ADL at set up assistance   Patient will complete lower body ADL at minimal assistance   Patient will complete toileting at stand by assistance   Patient will complete functional transfers at stand by assistance   Patient will complete functional mobility at stand by assistance   Patient will increase functional standing balance to 6-8 minutes SBA for improved ADL completion  Patient will complete bed mobility at modified independent      Above goals reviewed on 08/22/2021.  All goals are ongoing at this time unless indicated above.     Therapy Session Time     Individual Group Co-treatment   Time In    5462   Time Out    0941   Minutes    60        Timed Code Treatment Minutes:   45  Total Treatment Minutes:  60       Electronically Signed By: Gust Brooms, OTR/L [phone]

## 2021-08-22 NOTE — Anesthesia Post-Procedure Evaluation (Signed)
Department of Anesthesiology  Postprocedure Note    Patient: Amber Palmer  MRN: [id number]  Birthdate: 1929/07/07  Date of evaluation: 08/22/2021      Procedure Summary     Date: 08/22/21 Room / Location: [place] [facility]    Anesthesia Start: 2409 Anesthesia Stop:     Procedure: EGD PEG TUBE PLACEMENT (Abdomen) Diagnosis:       Anorexia      (Anorexia [R63.0])    Surgeons: Waynard Reeds, MD Responsible Provider: Levonne Hubert, MD    Anesthesia Type: MAC ASA Status: 4          Anesthesia Type: No value filed.    Aldrete Phase I: Aldrete Score: 10    Aldrete Phase II:        Anesthesia Post Evaluation    Patient location during evaluation: PACU  Patient participation: complete - patient participated  Level of consciousness: awake  Airway patency: patent  Nausea & Vomiting: no vomiting and no nausea  Complications: no  Cardiovascular status: hemodynamically stable  Respiratory status: acceptable  Hydration status: stable  Multimodal analgesia pain management approach

## 2021-08-22 NOTE — Progress Notes (Signed)
Awakening. Alert & oriented. Denies pain.

## 2021-08-22 NOTE — Progress Notes (Addendum)
V2.0    [facility] Progress Note      Name:  Amber Palmer DOB/Age/Sex: October 26, 1929  (88 y.o. female)   MRN & CSN:  [id number] & [id number] Encounter Date/Time: 08/22/2021 9:51 AM EDT   Location:  [id number] PCP: Valli Glance, APRN - NP     Attending:Jimya Ciani Humphrey Rolls, [facility] Day: 5    Assessment and Recommendations     Amber Palmer is a 88 year old female with HTN and HLD who presented with Chief Complaint of increased weakness and weight loss.  ED HR initially 106.  Cr 1.1, Lipase 487, T bili 0.8, UA suggestive of UTI.  Urine culture obtain and she was started on Rocephin.  CXR negative for acute pathology.  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  MRCP showed pancreatic head mass.  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 OP.  Discussed with patient again.  Patient has not decided whether to pursue PEG tube.  Hospice consulted.  GI following.       #Glottic mass concerning for malignancy.  -Hem/Onc and GI on consult  -IV LR at 66  -Not a candidate for any cancer directed therapy per Oncology  -Currently unable to tolerate much oral intake  Scheduled for EGD today.  Patient has not decided on PEG tube placement.  Hospice consulted  GI following     #UTI  -Urine culture no growth  Completed 3 days of IV antibiotics     #HTN  -Continue home Lisinopril     #HLD  -Continue home Statin      DVT Prophylaxis: Lovenox.   Code Status: DNR CCA   barrier to DC: EGD pending, patient still deciding on PEG tube, hospice consultation pending,        Subjective:     Was seen and examined this morning.  Patient continues to state that she is unable to keep food down and remains full.  No acute events overnight.  Patient mains afebrile with stable vital  signs.    Review of Systems:      Pertinent positives and negatives discussed in HPI    Objective:     Intake/Output Summary (Last 24 hours) at 08/22/2021 0951  Last data filed at 08/21/2021 1816  Gross per 24 hour   Intake 100 ml   Output 300 ml   Net -200 ml      Vitals:   Vitals:    08/21/21 1030 08/21/21 1715 08/21/21 2130 08/22/21 0506   BP: 106/63 119/75 119/63 137/68   Pulse: 70 79 74 73   Resp: '18 18 18 18   '$ Temp: 97.9 F (36.6 C) 98.1 F (36.7 C) 97.9 F (36.6 C) 98.3 F (36.8 C)   TempSrc: Oral Oral Oral Oral   SpO2: 98% 100% 97% 99%   Weight:       Height:             Physical Exam:      General: awake, alert, in NAD  Eyes: EOMI  ENT: neck supple  Cardiovascular: Regular rate.  Respiratory: Clear to auscultation  Gastrointestinal: Soft, non tender, BS+  Genitourinary: no suprapubic tenderness  Musculoskeletal: No edema  Skin: warm, dry  Neuro: Alert alert, Cranial nerves intack, no focal motor or sensory deficits.  Psych: Mood appropriate.         Medications:   Medications:    sodium chloride flush  5-40 mL IntraVENous 2 times per day    lisinopril  5 mg Oral Daily    sodium chloride flush  5-40 mL IntraVENous 2 times per day    enoxaparin  30 mg SubCUTAneous Daily    atorvastatin  20 mg Oral Daily      Infusions:    lactated ringers IV soln 75 mL/hr at 08/21/21 1038    sodium chloride      sodium chloride 5 mL/hr at 08/21/21 1040     PRN Meds: sodium chloride flush, 5-40 mL, PRN  sodium chloride, 25 mL, PRN  sodium chloride flush, 5-40 mL, PRN  sodium chloride, , PRN  ondansetron, 4 mg, Q8H PRN   Or  ondansetron, 4 mg, Q6H PRN  polyethylene glycol, 17 g, Daily PRN  acetaminophen, 650 mg, Q6H PRN   Or  acetaminophen, 650 mg, Q6H PRN        Labs and Imaging   CT ABDOMEN PELVIS W IV CONTRAST Additional Contrast? None    Result Date: 08/18/2021  EXAMINATION: CT OF THE ABDOMEN AND PELVIS WITH CONTRAST 08/18/2021 6:46 pm TECHNIQUE: CT of the abdomen and pelvis was performed with the administration of  intravenous contrast. Multiplanar reformatted images are provided for review. Automated exposure control, iterative reconstruction, and/or weight based adjustment of the mA/kV was utilized to reduce the radiation dose to as low as reasonably achievable. COMPARISON: 07/20/2017 and prior HISTORY: ORDERING SYSTEM PROVIDED HISTORY: abd pain - elevated lipase TECHNOLOGIST PROVIDED HISTORY: If patient is on cardiac monitor and/or pulse ox, they may be taken off cardiac monitor and pulse ox, left on O2 if currently on. All monitors reattached when patient returns to room. Additional Contrast?->None Reason for exam:->abd pain - elevated lipase Reason for Exam: abd pain - elevated lipase FINDINGS: Lower Chest: Mild dependent subsegmental atelectasis. Organs: A few subcentimeter hypodensities in the liver and kidneys that are too small to characterize but most likely represent simple cysts that do not require follow up imaging..  Normal gallbladder. No evidence of biliary ductal dilatation. Spleen is normal in size. Increased heterogeneous hypodense lesion near the pancreatic head measuring 2 x 1 cm, previously 1.4 x 1.2..  No evidence of ductal dilatation. Normal adrenal glands. A simple cyst in each kidney that requires no further follow-up imaging.  A 4 mm calculus in the lower pole of the left kidney.  No hydronephrosis. Pelvis: Normal bladder. Retroverted uterus. GI/Bowel: Normal stomach. Scattered wall thickening of the small and large bowel.  Appendix is normal. Peritoneum/Retroperitoneum:No free fluid, free air, organized fluid collection or lymphadenopathy. Moderate calcific atherosclerosis. Soft tissues: Normal. Bones: Diffuse demineralization with moderate multilevel degenerative disc disease.     Scattered wall thickening of the small and large bowel could be secondary to under distension versus enterocolitis. Increased heterogeneous hypodense lesion near the pancreatic head could represent an intraductal  papillary mucinous neoplasm.  If clinically warranted, further characterization could be obtained with an MRI pancreatic mass protocol. Nonobstructive left nephrolithiasis.     XR CHEST PORTABLE    Result Date: 08/18/2021  EXAMINATION: ONE XRAY VIEW OF THE CHEST 08/18/2021 4:53 pm COMPARISON: June 02, 2013 HISTORY: ORDERING SYSTEM PROVIDED HISTORY: Weakness TECHNOLOGIST PROVIDED HISTORY: Reason for exam:->Weakness Reason for Exam: Weakness FINDINGS: The lungs are without acute focal process.  There is no effusion or pneumothorax. The cardiomediastinal silhouette is without acute process. The osseous  structures are without acute process.     No acute process.     MRI ABDOMEN W WO CONTRAST MRCP    Result Date: 08/19/2021  EXAMINATION: MRI OF THE ABDOMEN WITH AND WITHOUT CONTRAST AND MRCP 08/19/2021 11:38 am TECHNIQUE: Multiplanar multisequence MRI of the abdomen was performed without and with the administration of 9 mL MultiHance intravenous contrast.  After initial T2 axial and coronal images, thick slab, thin slab and 3D coronal MRCP sequences were obtained without the administration of intravenous contrast.  MIP images are provided for review. COMPARISON: CT from yesterday and on 07/20/2017 HISTORY: Pancreatic head lesion. FINDINGS: Image quality degraded by motion artifact. Slightly superior and lateral to the left nipple, there is a 1 cm enhancing mass in the left breast. In the pancreatic head, there is a microcystic lesion measuring 2.4 x 2.0 x 0.9 cm.  An additional 0.8 cm cystic lesion is seen at the junction of the pancreatic body and tail.  No abnormal enhancement or restricted diffusion seen.  Pancreatic duct is not dilated and there is no pancreas divisum. Possible gallbladder sludge.  No biliary ductal dilatation.  Common bile duct measures 6 mm and tapers distally with no intraductal filling defect seen. Liver demonstrates normal morphology without steatosis.  Tiny hepatic cysts. Spleen and adrenals are  unremarkable.  Renal cysts, left greater than right, measure up to 5.8 cm.  Symmetric enhancement of the kidneys without hydronephrosis.  No ascites or significant lymphadenopathy.  Visualized osseous structures and gastrointestinal tract are unremarkable. Atherosclerotic disease of the abdominal aorta without aneurysm.  Small sacral Tarlov cysts.     1. In the pancreatic head, there is a microcystic lesion measuring 2.4 x 2.0 x 0.9 cm which does not appear significantly changed from 2019.  This is favored to represent a serous cystic neoplasm versus a branch duct IPMN, likely benign.  Consider endoscopic ultrasound for further evaluation. Otherwise this can be re-evaluated on 1 year follow-up noncontrast MRI. 2. In the left breast, there is a 1 cm enhancing mass just superior and lateral to the nipple.  Consider diagnostic mammography and ultrasound for further evaluation.       CBC:   Recent Labs     08/20/21  0445 08/21/21  0629 08/22/21  0554   WBC 4.4 4.8 5.0   HGB 12.0 11.6* 11.7*   PLT 113* 105* 125*     BMP:    Recent Labs     08/20/21  0445 08/21/21  0629 08/22/21  0554   NA 145 136 137   K 4.7 4.0 4.0   CL 111* 108 104   CO2 26 19* 27   BUN '17 10 7   '$ CREATININE 0.8 0.6 0.7   GLUCOSE 96 138* 103*     Hepatic:   Recent Labs     08/20/21  0445 08/21/21  0629 08/22/21  0554   AST '19 23 19   '$ ALT '17 17 16   '$ BILITOT 0.5 0.5 0.5   ALKPHOS 55 49 47     Lipids:   Lab Results   Component Value Date/Time    CHOL 235 06/18/2013 03:40 PM    HDL 72 06/18/2013 03:40 PM    TRIG 58 06/18/2013 03:40 PM     Hemoglobin A1C: No results found for: LABA1C  TSH:   Lab Results   Component Value Date/Time    TSH 1.90 06/18/2013 03:40 PM     Troponin: No results found for: TROPONINT  Lactic Acid:  No results for input(s): LACTA in the last 72 hours.  BNP: No results for input(s): PROBNP in the last 72 hours.  UA:  Lab Results   Component Value Date/Time    NITRU Negative 08/18/2021 04:38 PM    COLORU DARK YELLOW 08/18/2021 04:38 PM     PHUR 5.5 08/18/2021 04:38 PM    WBCUA 21-50 08/18/2021 04:38 PM    RBCUA >100 08/18/2021 04:38 PM    MUCUS 1+ 08/23/2008 06:20 AM    BACTERIA 2+ 08/18/2021 04:38 PM    CLARITYU CLOUDY 08/18/2021 04:38 PM    SPECGRAV 1.025 08/18/2021 04:38 PM    LEUKOCYTESUR MODERATE 08/18/2021 04:38 PM    UROBILINOGEN 1.0 08/18/2021 04:38 PM    BILIRUBINUR Negative 08/18/2021 04:38 PM    BILIRUBINUR NEGATIVE 08/23/2008 06:20 AM    BLOODU LARGE 08/18/2021 04:38 PM    GLUCOSEU Negative 08/18/2021 04:38 PM    GLUCOSEU NEGATIVE 08/23/2008 06:20 AM    KETUA 80 08/18/2021 04:38 PM     Urine Cultures:   Lab Results   Component Value Date/Time    LABURIN No growth at 18 to 36 hours 08/19/2021 04:58 AM     Blood Cultures: No results found for: BC  No results found for: BLOODCULT2  Organism: No results found for: [facility]      Electronically signed by Alinda Money, MD on 08/22/2021 at 9:51 AM

## 2021-08-22 NOTE — Progress Notes (Signed)
Egd on chart, zenkers, 3 gastric nodules, 2 duodenal nodules, 3 mm dudoenal avm, biopsies/cautery deferred, peg placed  Rec  dietician consult for tube feeds  can use peg now for meds  can start tube feeds at 8 pm tonight  rec future outpt eus to evaluate pancreatic mass and gastric/duodenal nodules  I called son craig afer procedure.

## 2021-08-22 NOTE — Progress Notes (Signed)
[facility] Department   Phone: [phone]    Physical Therapy    '[x]'$  Initial Evaluation            '[]'$  Daily Treatment Note         '[]'$  Discharge Summary      Patient: Amber Palmer   DOB: [birth year]   MRN: [id number]   Date of Service:  08/22/2021  Admitting Diagnosis: Pancreatic cancer [facility])  Current Admission Summary: Pt is a 88 y/o. Female who presents with generalized weakness.  Past Medical History:  has a past medical history of Hyperlipidemia, Hypertension, Pancreatic cancer ([place]), and Vitamin D deficiency.  Past Surgical History:  has no past surgical history on file.  Discharge Recommendations: Amber Palmer scored a 17/24 on the AM-PAC short mobility form. Current research shows that an AM-PAC score of 17 or less is typically not associated with a discharge to the patient's home setting. Based on the patient's AM-PAC score and their current functional mobility deficits, it is recommended that the patient have 3-5 sessions per week of Physical Therapy at d/c to increase the patient's independence.  Please see assessment section for further patient specific details.    If patient discharges prior to next session this note will serve as a discharge summary.  Please see below for the latest assessment towards goals.      DME Required For Discharge: rolling walker  Precautions/Restrictions: high fall risk  Weight Bearing Restrictions: no restrictions    Required Braces/Orthotics: no braces required     Positional Restrictions:no positional restrictions    Pre-Admission Information   Lives With: son; one son is home all the time while the other works     Type of Home: house  Home Layout: one level  Home Access: level entry  [place]: tub/shower unit  [employer]:  No equipment in bathroom; Pt does not use the shower; she has been using a bucket & sponge bath for years  Toilet Height: standard height  Home Equipment:  Pt states her son used a rolling walker/cane, and  they still have it but he is very tall and not a height for her  Transfer Assistance: Independent without use of device  Ambulation Assistance:Independent without use of device  ADL Assistance: independent with all ADL's  IADL Assistance: requires assistance with all homemaking tasks, Pt states her sons do most of the household chores, she "does not do anything "  Active Driver:        '[]'$  Yes  '[x]'$  No  Hand Dominance: '[]'$  Left  '[x]'$  Right  Current Employment: retired.  Occupation: Nurse's aid  Hobbies: watching TV, "I can't do much at my age"   Recent Falls: No falls    Examination   Vision:   Vision Corrective Device: wears glasses for reading  Hearing:   WFL  Posture:   Pt demonstrates a very forward head and tends to look down frequently. The pt does not look up unless verbally cued to do so. Pt has a kyphotic posture and a very narrow BOS when standing.  Sensation:   WFL and denies numbness and tingling  Proprioception:    WFL  Tone:   Normotonic  Coordination Testing:   WFL    ROM:   (B) LE AROM WFL  (B) Hip AROM WFL; Has some compensation with a posterior lean  Strength:   (B) LE strength grossly -4  Therapist Clinical Decision Making (Complexity): medium complexity  Clinical Presentation: evolving  Subjective  General: Pt was lying supine in bed upon PT arrival. Pt was agreeable upon PT/OT eval. Pt stated she has not gotten out of bed in a few days but feels ready to try it today. Pt states she is not feeling any pain today currently, just feels weak overall.  Pain: 0/10  Pain Interventions: RN notified, repositioned , and therapy activities modified       Functional Mobility  Bed Mobility  Supine to Sit: stand by assistance  Comments: Head of bed with elevated and pt used bilateral handrails to come up to a sitting position  Transfers  Sit to stand transfer: minimal assistance  Stand to sit transfer: minimal assistance  Toilet transfer: minimal assistance  Comments: Pt demonstrated a posterior lean when  standing and descending to sit. Pt used bilateral UE support, such as grab bars or hand holds from OT/SPT in order to transfer. Pt required frequent verbal cueing with some tactile cueing to find her handrails and what to do when transferring.  Ambulation  Surface:level surface  Assistive Device: rolling walker  Assistance: contact guard assistance  Distance: ~9f, 549f Gait mechanics: Pt demonstrated decreased cadence, decreased stride length and a slow pace overall. Pt demonstrated a step-to gait pattern using the RW and without. Pt also demonstrated a very narrow BOS where the medial sides of her feet were touching. Pt walks with her head down, looking at her feet. Pt required increased time to move.  Comments: Pt was verbally cued to take bigger steps and try to step past her other foot; Pt was able to do so with the L foot but not the R foot. Pt was also cued to look forward and stand up tall with good posture. Cues only lasted a few steps until pt needed the cue again.  Pt ambulated the first distance with hand-held assist and the second ambulation with a RW.     Stair Mobility  Stair mobility not completed on this date.  Comments:  Wheelchair Mobility:  No w/c mobility completed on this date.  Comments:  Balance  Static Sitting Balance: fair (-): maintains balance at CGA with use of UE support  Dynamic Sitting Balance: poor (+): requires min (A) to maintain balance  Static Standing Balance: fair (-): maintains balance at CGA with use of UE support  Dynamic Standing Balance: poor (+): requires min (A) to maintain balance  Comments: pt was assisted with ADLs while seated the sink     Other Therapeutic Interventions    Functional Outcomes  AM-PAC Inpatient Mobility Raw Score : 17              Cognition  WFL  Orientation:    alert and oriented x 4  Command Following:   [facility]  Education  Barriers To Learning: none  Patient Education: patient educated on goals, PT role and benefits, plan of care, general safety,  energy conservation  Learning Assessment:  patient verbalizes and demonstrates understanding    Assessment  Activity Tolerance: Pt has a decreased activity tolerance due to her increased time to complete activities, decrease strength and endurance.  Impairments Requiring Therapeutic Intervention: decreased functional mobility, decreased strength, decreased safety awareness, decreased endurance, decreased balance  Prognosis: good  Clinical Assessment: Pt is a [age]/o. Female with generalized weakness, decreased endurance, decreased safety awareness and decreased balance. She is at a high risk of falls due to her narrow BOS and decreased cadence during gait.  Pt would benefit from  SNF to decrease the risk of falls and regain strength and endurance needed for ADLs and mobility around her home before returning home. Pt has good family support from her sons.  Safety Interventions: patient left in chair, chair alarm in place, call light within reach, patient at risk for falls, and nurse notified    Plan  Frequency: 3-5 x/per week  Current Treatment Recommendations: strengthening, balance training, functional mobility training, endurance training, and patient/caregiver education    Goals  Patient Goals: to go back home with her sons  Short Term Goals:  Time Frame: until discharge  Patient will complete bed mobility at supervision   Patient will complete transfers at stand by assistance   Patient will ambulate 100 ft with use of rolling walker at contact guard assistance     Above goals reviewed on 08/22/2021.  All goals are ongoing at this time unless indicated above.    Therapy Session Time      Individual Group Co-treatment   Time In     1191   Time Out     0941   Minutes     60     Timed Code Treatment Minutes:  95 Minutes  Total Treatment Minutes:  60       Electronically Signed By: Kevan Ny, PT    Amber Palmer, SPT  I agree with the above note.  PT directly observed the SPT with the patient.  Amber Palmer, [place] DPT  [phone]

## 2021-08-22 NOTE — Addendum Note (Signed)
Addendum  created 08/22/21 1604 by Sheran Spine, APRN - CRNA    Intraprocedure Meds edited, Orders acknowledged in Narrator

## 2021-08-22 NOTE — Progress Notes (Signed)
Adm to pacu from endo per cart unresponsive. Respirations symmetrical. Abdomen soft with peg tube patent & clamped. Dressing to site dry & intact. VSS. Report received from endo staff.

## 2021-08-22 NOTE — Progress Notes (Signed)
Abdominal binder to be placed on to cover PEG - central supply closed, abd binder unable to be obtained at this time.

## 2021-08-23 LAB — CBC
Hematocrit: 35.8 % — ABNORMAL LOW (ref 36.0–48.0)
Hemoglobin: 11.7 g/dL — ABNORMAL LOW (ref 12.0–16.0)
MCH: 32.8 pg (ref 26.0–34.0)
MCHC: 32.8 g/dL (ref 31.0–36.0)
MCV: 100 fL (ref 80.0–100.0)
MPV: 10.3 fL (ref 5.0–10.5)
Platelets: 124 10*3/uL — ABNORMAL LOW (ref 135–450)
RBC: 3.58 M/uL — ABNORMAL LOW (ref 4.00–5.20)
RDW: 12.5 % (ref 12.4–15.4)
WBC: 6.9 10*3/uL (ref 4.0–11.0)

## 2021-08-23 LAB — COMPREHENSIVE METABOLIC PANEL
ALT: 13 U/L (ref 10–40)
AST: 19 U/L (ref 15–37)
Albumin/Globulin Ratio: 1.1 (ref 1.1–2.2)
Albumin: 2.9 g/dL — ABNORMAL LOW (ref 3.4–5.0)
Alkaline Phosphatase: 45 U/L (ref 40–129)
Anion Gap: 12 (ref 3–16)
BUN: 5 mg/dL — ABNORMAL LOW (ref 7–20)
CO2: 24 mmol/L (ref 21–32)
Calcium: 8.7 mg/dL (ref 8.3–10.6)
Chloride: 105 mmol/L (ref 99–110)
Creatinine: 0.7 mg/dL (ref 0.6–1.2)
Est, Glom Filt Rate: >60 (ref >60)
Glucose: 82 mg/dL (ref 70–99)
Potassium: 3.9 mmol/L (ref 3.5–5.1)
Sodium: 141 mmol/L (ref 136–145)
Total Bilirubin: 0.5 mg/dL (ref 0.0–1.0)
Total Protein: 5.5 g/dL — ABNORMAL LOW (ref 6.4–8.2)

## 2021-08-23 LAB — MAGNESIUM: Magnesium: 1.6 mg/dL — ABNORMAL LOW (ref 1.80–2.40)

## 2021-08-23 LAB — PHOSPHORUS: Phosphorus: 3.1 mg/dL (ref 2.5–4.9)

## 2021-08-23 MED ORDER — THIAMINE HCL 100 MG/ML IJ SOLN
100 | Freq: Every day | INTRAMUSCULAR | Status: DC
Start: 2021-08-23 — End: 2021-08-25
  Administered 2021-08-23 – 2021-08-25 (×3): 100 mg via INTRAVENOUS

## 2021-08-23 MED FILL — LISINOPRIL 5 MG PO TABS: 5 mg | ORAL | Qty: 1

## 2021-08-23 MED FILL — ATORVASTATIN CALCIUM 20 MG PO TABS: 20 mg | ORAL | Qty: 1

## 2021-08-23 MED FILL — THIAMINE HCL 100 MG/ML IJ SOLN: 100 mg/mL | INTRAMUSCULAR | Qty: 1

## 2021-08-23 MED FILL — MAPAP 325 MG PO TABS: 325 mg | ORAL | Qty: 2

## 2021-08-23 NOTE — Plan of Care (Signed)
Problem: Pain  Goal: Verbalizes/displays adequate comfort level or baseline comfort level  Outcome: Progressing     Problem: Skin/Tissue Integrity  Goal: Absence of new skin breakdown  Description: 1.  Monitor for areas of redness and/or skin breakdown  2.  Assess vascular access sites hourly  3.  Every 4-6 hours minimum:  Change oxygen saturation probe site  4.  Every 4-6 hours:  If on nasal continuous positive airway pressure, respiratory therapy assess nares and determine need for appliance change or resting period.  Outcome: Progressing

## 2021-08-23 NOTE — Progress Notes (Addendum)
Nutrition Note  Peg tube placed 5/30.  Consult received for TF order & manage.  Pt is at high risk for refeeding syndrome.  Recommend Thiamine per pharmacy, prior to TF initiation.    Recommend order "Diet: Adult Tube Feed".  Initiate Jevity 1.5 (standard with fiber formula) at 20 mL/hr and hold until RD evaluates tolerance/lytes.    Once tolerance established per RD, recommend increasing rate by 10 ml q 8 hours until goal of 45 mL/hr which provides 1080 ml; 1620 kcal, 69 g pro & 820 ml free water. .  Recommend 30 mL H20 q 4 hours while receiving IVF (LR @ 75 ml/hr).  Once IVF d/c'd, recommend 135 ml water flush q 4 hr.  Increase flush if Na+ increases greater than 145 mEq/L.  Monitor closely and correct lytes (K, Phos, Mg) before progressing TF to goal d/t risk of refeeding syndrome (hx extended inadequate nutritional intake).  Ensure head of bed is 30 - 45 degrees during continuous or bolus gastric feeding and for one hour after bolus. Turn off the feeding if head of bed is lowered less than 30 degrees.   Monitor for tolerance (bowel habits, N/V, cramping, abdominal exam findings: distended, firm, tense, guarded, discomfort).  Irrigate tube with 30 mL water before, between and after each medication.  If tube becomes clogged, first try flushing with water.  If water does not unclog the tube, may use "clog zapper" at the direction of the physician/LIP. If tube remains clogged, contact Physician/LIP for additional orders.  Closed System Guidelines:  Change tubing and feeding container every 24 hours.    Electronically signed by Vincenza Hews, RD, LD on 08/23/21 at 8:02 AM EDT    Contact: [phone]

## 2021-08-23 NOTE — Progress Notes (Signed)
V2.0    [facility] Progress Note      Name:  Amber Palmer DOB/Age/Sex: Feb 22, 1930  (88 y.o. female)   MRN & CSN:  [id number] & [id number] Encounter Date/Time: 08/23/2021 10:14 AM EDT   Location:  [id number] PCP: Valli Glance, APRN - NP     Attending:Ellayna Hilligoss Humphrey Rolls, MD       Hospital Day: 6    Assessment and Recommendations       Amber Palmer is a 88 year old female with HTN and HLD who presented with Chief Complaint of increased weakness and weight loss.  ED HR initially 106.  Cr 1.1, Lipase 487, T bili 0.8, UA suggestive of UTI.  Urine culture obtain and she was started on Rocephin.  CXR negative for acute pathology.  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  MRCP showed pancreatic head mass.  GI suspected adenocarcinoma and recommended EUS outpatient.  Patient is now status post EGD and PEG tube placement.  Start tube feeds and advanced as tolerated to goal.  Hospice consultation pending.     #Pancreatic mass concerning for malignancy.  -Hem/Onc and GI on consult  -IV LR at 79  -Not a candidate for any cancer directed therapy per Oncology  -Currently unable to tolerate much oral intake  Status post EGD and PEG tube placement on 08/22/2021:  Start tube feeds and advance as tolerated to goal  Hospice consultation pending.  #UTI  -Urine culture no growth  Completed 3 days of IV antibiotics     #HTN  -Continue home Lisinopril     #HLD  -Continue home Statin        DVT Prophylaxis: Lovenox.   Code Status: DNR CCA   barrier to DC: Start tube feeds and advance to goal hospice consultation pending        Subjective:     Patient was seen and examined today.  No acute events overnight.  Patient remains febrile with stable vital signs.      Review of Systems:       Pertinent positives and negatives discussed in HPI    Objective:     Intake/Output Summary (Last 24 hours) at 08/23/2021 1014  Last data filed at 08/22/2021 2334  Gross per 24 hour   Intake 910 ml   Output 800 ml   Net 110 ml      Vitals:   Vitals:    08/23/21 0409 08/23/21 0730 08/23/21 0748 08/23/21 0800   BP: 111/66 128/71  (!) 146/88   Pulse: 86 77 77 78   Resp: '16 16  16   '$ Temp: 98.1 F (36.7 C) 98.5 F (36.9 C)  98 F (36.7 C)   TempSrc: Oral Oral  Oral   SpO2: 96% 97%  100%   Weight:       Height:             Physical Exam:      General: awake, alert, in NAD  Eyes: EOMI  ENT: neck supple  Cardiovascular: Regular rate.  Respiratory: Clear to auscultation  Gastrointestinal: Soft, non tender, BS+  Genitourinary: no suprapubic tenderness  Musculoskeletal: No edema  Skin: warm, dry  Neuro: Alert alert, Cranial nerves intack, no focal motor or sensory deficits.   Psych: Mood appropriate.  Medications:   Medications:    thiamine  100 mg IntraVENous Daily    [START ON 08/24/2021] enoxaparin  30 mg SubCUTAneous Daily    lisinopril  5 mg Oral Daily    sodium chloride flush  5-40 mL IntraVENous 2 times per day    atorvastatin  20 mg Oral Daily      Infusions:    lactated ringers IV soln 75 mL/hr at 08/22/21 1652    sodium chloride Stopped (08/22/21 1553)     PRN Meds: sodium chloride flush, 5-40 mL, PRN  sodium chloride, , PRN  ondansetron, 4 mg, Q8H PRN   Or  ondansetron, 4 mg, Q6H PRN  polyethylene glycol, 17 g, Daily PRN  acetaminophen, 650 mg, Q6H PRN   Or  acetaminophen, 650 mg, Q6H PRN        Labs and Imaging   CT ABDOMEN PELVIS W IV CONTRAST Additional Contrast? None    Result Date: 08/18/2021  EXAMINATION: CT OF THE ABDOMEN AND PELVIS WITH CONTRAST 08/18/2021 6:46 pm TECHNIQUE: CT of the abdomen and pelvis was performed with the administration of intravenous contrast. Multiplanar reformatted images are provided for review. Automated exposure control, iterative reconstruction, and/or weight based  adjustment of the mA/kV was utilized to reduce the radiation dose to as low as reasonably achievable. COMPARISON: 07/20/2017 and prior HISTORY: ORDERING SYSTEM PROVIDED HISTORY: abd pain - elevated lipase TECHNOLOGIST PROVIDED HISTORY: If patient is on cardiac monitor and/or pulse ox, they may be taken off cardiac monitor and pulse ox, left on O2 if currently on. All monitors reattached when patient returns to room. Additional Contrast?->None Reason for exam:->abd pain - elevated lipase Reason for Exam: abd pain - elevated lipase FINDINGS: Lower Chest: Mild dependent subsegmental atelectasis. Organs: A few subcentimeter hypodensities in the liver and kidneys that are too small to characterize but most likely represent simple cysts that do not require follow up imaging..  Normal gallbladder. No evidence of biliary ductal dilatation. Spleen is normal in size. Increased heterogeneous hypodense lesion near the pancreatic head measuring 2 x 1 cm, previously 1.4 x 1.2..  No evidence of ductal dilatation. Normal adrenal glands. A simple cyst in each kidney that requires no further follow-up imaging.  A 4 mm calculus in the lower pole of the left kidney.  No hydronephrosis. Pelvis: Normal bladder. Retroverted uterus. GI/Bowel: Normal stomach. Scattered wall thickening of the small and large bowel.  Appendix is normal. Peritoneum/Retroperitoneum:No free fluid, free air, organized fluid collection or lymphadenopathy. Moderate calcific atherosclerosis. Soft tissues: Normal. Bones: Diffuse demineralization with moderate multilevel degenerative disc disease.     Scattered wall thickening of the small and large bowel could be secondary to under distension versus enterocolitis. Increased heterogeneous hypodense lesion near the pancreatic head could represent an intraductal papillary mucinous neoplasm.  If clinically warranted, further characterization could be obtained with an MRI pancreatic mass protocol. Nonobstructive left  nephrolithiasis.     XR CHEST PORTABLE    Result Date: 08/18/2021  EXAMINATION: ONE XRAY VIEW OF THE CHEST 08/18/2021 4:53 pm COMPARISON: June 02, 2013 HISTORY: ORDERING SYSTEM PROVIDED HISTORY: Weakness TECHNOLOGIST PROVIDED HISTORY: Reason for exam:->Weakness Reason for Exam: Weakness FINDINGS: The lungs are without acute focal process.  There is no effusion or pneumothorax. The cardiomediastinal silhouette is without acute process. The osseous structures are without acute process.     No acute process.     MRI ABDOMEN W WO CONTRAST MRCP    Result Date: 08/19/2021  EXAMINATION: MRI OF THE ABDOMEN WITH  AND WITHOUT CONTRAST AND MRCP 08/19/2021 11:38 am TECHNIQUE: Multiplanar multisequence MRI of the abdomen was performed without and with the administration of 9 mL MultiHance intravenous contrast.  After initial T2 axial and coronal images, thick slab, thin slab and 3D coronal MRCP sequences were obtained without the administration of intravenous contrast.  MIP images are provided for review. COMPARISON: CT from yesterday and on 07/20/2017 HISTORY: Pancreatic head lesion. FINDINGS: Image quality degraded by motion artifact. Slightly superior and lateral to the left nipple, there is a 1 cm enhancing mass in the left breast. In the pancreatic head, there is a microcystic lesion measuring 2.4 x 2.0 x 0.9 cm.  An additional 0.8 cm cystic lesion is seen at the junction of the pancreatic body and tail.  No abnormal enhancement or restricted diffusion seen.  Pancreatic duct is not dilated and there is no pancreas divisum. Possible gallbladder sludge.  No biliary ductal dilatation.  Common bile duct measures 6 mm and tapers distally with no intraductal filling defect seen. Liver demonstrates normal morphology without steatosis.  Tiny hepatic cysts. Spleen and adrenals are unremarkable.  Renal cysts, left greater than right, measure up to 5.8 cm.  Symmetric enhancement of the kidneys without hydronephrosis.  No ascites or  significant lymphadenopathy.  Visualized osseous structures and gastrointestinal tract are unremarkable. Atherosclerotic disease of the abdominal aorta without aneurysm.  Small sacral Tarlov cysts.     1. In the pancreatic head, there is a microcystic lesion measuring 2.4 x 2.0 x 0.9 cm which does not appear significantly changed from 2019.  This is favored to represent a serous cystic neoplasm versus a branch duct IPMN, likely benign.  Consider endoscopic ultrasound for further evaluation. Otherwise this can be re-evaluated on 1 year follow-up noncontrast MRI. 2. In the left breast, there is a 1 cm enhancing mass just superior and lateral to the nipple.  Consider diagnostic mammography and ultrasound for further evaluation.       CBC:   Recent Labs     08/21/21  0629 08/22/21  0554 08/23/21  0712   WBC 4.8 5.0 6.9   HGB 11.6* 11.7* 11.7*   PLT 105* 125* 124*     BMP:    Recent Labs     08/21/21  0629 08/22/21  0554 08/23/21  0712   NA 136 137 141   K 4.0 4.0 3.9   CL 108 104 105   CO2 19* 27 24   BUN 10 7 5*   CREATININE 0.6 0.7 0.7   GLUCOSE 138* 103* 82     Hepatic:   Recent Labs     08/21/21  0629 08/22/21  0554 08/23/21  0712   AST '23 19 19   '$ ALT '17 16 13   '$ BILITOT 0.5 0.5 0.5   ALKPHOS 49 47 45     Lipids:   Lab Results   Component Value Date/Time    CHOL 235 06/18/2013 03:40 PM    HDL 72 06/18/2013 03:40 PM    TRIG 58 06/18/2013 03:40 PM     Hemoglobin A1C: No results found for: LABA1C  TSH:   Lab Results   Component Value Date/Time    TSH 1.90 06/18/2013 03:40 PM     Troponin: No results found for: TROPONINT  Lactic Acid: No results for input(s): LACTA in the last 72 hours.  BNP: No results for input(s): PROBNP in the last 72 hours.  UA:  Lab Results   Component Value Date/Time  NITRU Negative 08/18/2021 04:38 PM    COLORU DARK YELLOW 08/18/2021 04:38 PM    PHUR 5.5 08/18/2021 04:38 PM    WBCUA 21-50 08/18/2021 04:38 PM    RBCUA >100 08/18/2021 04:38 PM    MUCUS 1+ 08/23/2008 06:20 AM    BACTERIA 2+  08/18/2021 04:38 PM    CLARITYU CLOUDY 08/18/2021 04:38 PM    SPECGRAV 1.025 08/18/2021 04:38 PM    LEUKOCYTESUR MODERATE 08/18/2021 04:38 PM    UROBILINOGEN 1.0 08/18/2021 04:38 PM    BILIRUBINUR Negative 08/18/2021 04:38 PM    BILIRUBINUR NEGATIVE 08/23/2008 06:20 AM    BLOODU LARGE 08/18/2021 04:38 PM    GLUCOSEU Negative 08/18/2021 04:38 PM    GLUCOSEU NEGATIVE 08/23/2008 06:20 AM    KETUA 80 08/18/2021 04:38 PM     Urine Cultures:   Lab Results   Component Value Date/Time    LABURIN No growth at 18 to 36 hours 08/19/2021 04:58 AM     Blood Cultures: No results found for: BC  No results found for: BLOODCULT2  Organism: No results found for: [facility]      Electronically signed by Alinda Money, MD on 08/23/2021 at 10:14 AM

## 2021-08-23 NOTE — Care Coordination-Inpatient (Signed)
Case Management Assessment  Initial Evaluation    Date/Time of Evaluation: 08/23/2021 4:42 PM  Assessment Completed by: Sharin Grave, MSW, LSW    If patient is discharged prior to next notation, then this note serves as note for discharge by case management.    Patient Name: Amber Palmer                   Date of Birth: Sep 23, 1929  Diagnosis: Hypermagnesemia [E83.41]  Pancreatic cancer ([place]) [C25.9]  Elevated BUN [R79.9]  Generalized weakness [R53.1]  Pancreatic mass [K86.89]  Acute cystitis with hematuria [N30.01]  Elevated lipase [R74.8]                   Date / Time: 08/18/2021  3:54 PM    Patient Admission Status: Inpatient   Readmission Risk (Low < 19, Mod (19-27), High > 27): Readmission Risk Score: 13.1    Current PCP: Valli Glance, APRN - NP  PCP verified by CM? Yes    Chart Reviewed: Yes      History Provided by: Patient  Patient Orientation: Alert and Oriented    Patient Cognition: Alert    Hospitalization in the last 30 days (Readmission):  No    If yes, Readmission Assessment in CM Navigator will be completed.    Advance Directives:      Code Status: DNR-CCA   Patient's Primary Decision Maker is:        Discharge Planning:    Patient lives with: Children (w/2 sons.  One son works.  The other is at home all day but disabled.) Type of Home: House (1 level)  Primary Care Giver: Self  Patient Support Systems include: Children   Current Financial resources: Medicare  Current community resources: None  Current services prior to admission: Durable Medical Equipment            Current DME: Other (Comment) (walker and cane)            Type of Home Care services:  None    ADLS  Prior functional level: Independent in ADLs/IADLs  Current functional level: Mobility (using walker about 50 feet)    PT AM-PAC: 17 /24  OT AM-PAC: 16 /24    Family can provide assistance at DC: No  Would you like Case Management to discuss the discharge plan with any other family members/significant others, and if so, who? Yes (w/sons)  Plans  to Return to Present Housing: No (SNF recommended.  Sons have concerns about ability to care for patient at home.)  Other Identified Issues/Barriers to RETURNING to current housing: None  Potential Assistance needed at discharge: [place] (Wants referral sent to [facility])            Potential DME:    Patient expects to discharge to: [place] for transportation at discharge:  (Will need assistance with transport)    Financial    Payor: [place] / Plan: [place] / Product Type: *No Product type* /     Does insurance require precert for SNF: Yes    Potential assistance Purchasing Medications: No  Meds-to-Beds request:        [facility] [id number], [place] [address] [id number]  [place] [address]  Phone: [phone] Fax: [phone]      Notes:    Factors facilitating achievement of predicted outcomes: Family support and Cooperative  Barriers to discharge: None    Additional Case Management Notes:  Patient was recommended SNF.  Son and patient were in the room and discussed together.  Patient was indecisive at first but chose SNF in the end and wanted referral sent to [facility].  Faxed referral today to the facility.  Waiting on response.  Pt will need a precert.      The Plan for Transition of Care is related to the following treatment goals of Hypermagnesemia [E83.41]  Pancreatic cancer ([place]) [C25.9]  Elevated BUN [R79.9]  Generalized weakness [R53.1]  Pancreatic mass [K86.89]  Acute cystitis with hematuria [N30.01]  Elevated lipase [U98.1]    IF APPLICABLE: The Patient and/or patient representative Sharnelle and her family were provided with a choice of provider and agrees with the discharge plan. Freedom of choice list with basic dialogue that supports the patient's individualized plan of care/goals and shares the quality data associated with the providers was provided to: Patient    Patient Representative Name:       The Patient and/or Patient Representative Agree with the Discharge Plan? Yes    Sharin Grave, MSW, LSW  Case Management Department    Electronically signed by Sharin Grave, MSW, LSW on 08/23/2021 at 4:43 PM

## 2021-08-23 NOTE — Progress Notes (Addendum)
Gastroenterology Progress Note            Amber Palmer is a 88 y.o. female patient.  1. Generalized weakness    2. Elevated lipase    3. Hypermagnesemia    4. Elevated BUN    5. Acute cystitis with hematuria    6. Pancreatic mass        SUBJECTIVE: Feels okay.  No pain at PEG tube site.  Tube feeds have not been started yet.    Physical    VITALS:  BP (!) 146/88   Pulse 78   Temp 98 F (36.7 C) (Oral)   Resp 16   Ht '5\' 3"'$  (1.6 m)   Wt 102 lb 9.6 oz (46.5 kg)   SpO2 100%   BMI 18.17 kg/m   TEMPERATURE:  Current - Temp: 98 F (36.7 C); Max - Temp  Avg: 98.4 F (36.9 C)  Min: 98 F (36.7 C)  Max: 99.7 F (37.6 C)    NAD  RRR  Lungs CTA bilat  Abdomen soft, ND, NT, no HSM, Bowel sounds normal.  PEG tube in place in epigastrium.    Data      Recent Labs     08/21/21  0629 08/22/21  0554 08/23/21  0712   WBC 4.8 5.0 6.9   HGB 11.6* 11.7* 11.7*   HCT 34.9* 35.6* 35.8*   MCV 100.0 99.1 100.0   PLT 105* 125* 124*       Recent Labs     08/21/21  0629 08/22/21  0554 08/23/21  0712   NA 136 137 141   K 4.0 4.0 3.9   CL 108 104 105   CO2 19* 27 24   BUN 10 7 5*   CREATININE 0.6 0.7 0.7       Recent Labs     08/21/21  0629 08/22/21  0554 08/23/21  0712   AST '23 19 19   '$ ALT '17 16 13   '$ BILIDIR <0.2  --   --    BILITOT 0.5 0.5 0.5   ALKPHOS 49 47 45       No results for input(s): LIPASE, AMYLASE in the last 72 hours.    MRI/MRCP  08/19/21     IMPRESSION:  1. In the pancreatic head, there is a microcystic lesion measuring 2.4 x 2.0  x 0.9 cm which does not appear significantly changed from 2019.  This is  favored to represent a serous cystic neoplasm versus a branch duct IPMN,  likely benign.  Consider endoscopic ultrasound for further evaluation.  Otherwise this can be re-evaluated on 1 year follow-up noncontrast MRI.  2. In the left breast, there is a 1 cm enhancing mass just superior and  lateral to the nipple.  Consider diagnostic mammography and ultrasound for  further evaluation.         ASSESSMENT :      Pancreatic lesions -CT with increased heterogeneous hypodense lesion near pancreatic head which could represent IPMN.  MRCP reviewed -- panc head mass is microcystic and largely unchanged from prior imaging (2019).  Further w/u would include EUS.   -Outpatient EUS for pancreatic lesions and gastric and duodenal nodules.  Message sent in office chart.     Anorexia/Weight loss - she "can't eat" due to taste.  Denies dysphagia.  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  Nodules not biopsied as she will get future EUS  for pancreatic mass and nodules can be evaluated at that time.    -Tube feeds per dietitian and monitoring for refeeding syndrome.  -She would be okay to eat p.o. from GI standpoint when felt okay from a dietitian standpoint as they are monitoring for refeeding syndrome    We will be available if needed.  Discussed with Dr. Katherina Mires, PA-C  [facility]     I have personally performed a face to face diagnostic evaluation on this patient.  I have interviewed and examined the patient and I agree with the findings and recommended plan of care.  In summary, my findings and plan are the following: no abd pain s/p peg, abd soft/nontender, peg site c/d/I, peg freely rotates 360 degrees and easily flushes with return clear gastric contents. Rec loose abd binder in place, start tube feeds, ok for po as tolerated for my standpoint, dietician/primary team monitor for refeeding syndrome, outpt eus assess pancreas mass and gastric polyps, otherwise will sign off, call if needed.       Geannie Risen, MD  [place] [facility] [place]

## 2021-08-23 NOTE — Care Coordination-Inpatient (Signed)
SW informed shortly after assessment that patient and family had decided against SNF and would like home w/HHC instead.  Met with patient and both sons this time in pt's room.  They all agreed on home with [facility].  [place] list was provided.  No preference in facilities was indicated.  SW stated I would make a referral to [facility] to see if they can accept.  Pt and family agreeable.  Referral faxed to [facility].  Waiting on response.    Discharge Plan:  Home w/HHC.  [facility] referral made.    Electronically signed by Sharin Grave, MSW, LSW on 08/23/2021 at 5:42 PM

## 2021-08-24 LAB — COMPREHENSIVE METABOLIC PANEL
ALT: 14 U/L (ref 10–40)
AST: 21 U/L (ref 15–37)
Albumin/Globulin Ratio: 1 — ABNORMAL LOW (ref 1.1–2.2)
Albumin: 2.6 g/dL — ABNORMAL LOW (ref 3.4–5.0)
Alkaline Phosphatase: 44 U/L (ref 40–129)
Anion Gap: 5 (ref 3–16)
BUN: 6 mg/dL — ABNORMAL LOW (ref 7–20)
CO2: 29 mmol/L (ref 21–32)
Calcium: 8.8 mg/dL (ref 8.3–10.6)
Chloride: 106 mmol/L (ref 99–110)
Creatinine: 0.6 mg/dL (ref 0.6–1.2)
Est, Glom Filt Rate: >60 (ref >60)
Glucose: 160 mg/dL — ABNORMAL HIGH (ref 70–99)
Potassium: 3.6 mmol/L (ref 3.5–5.1)
Sodium: 140 mmol/L (ref 136–145)
Total Bilirubin: 0.5 mg/dL (ref 0.0–1.0)
Total Protein: 5.3 g/dL — ABNORMAL LOW (ref 6.4–8.2)

## 2021-08-24 LAB — CBC
Hematocrit: 33.6 % — ABNORMAL LOW (ref 36.0–48.0)
Hemoglobin: 11.1 g/dL — ABNORMAL LOW (ref 12.0–16.0)
MCH: 32.6 pg (ref 26.0–34.0)
MCHC: 33 g/dL (ref 31.0–36.0)
MCV: 98.8 fL (ref 80.0–100.0)
MPV: 9.4 fL (ref 5.0–10.5)
Platelets: 128 10*3/uL — ABNORMAL LOW (ref 135–450)
RBC: 3.4 M/uL — ABNORMAL LOW (ref 4.00–5.20)
RDW: 12.8 % (ref 12.4–15.4)
WBC: 6 10*3/uL (ref 4.0–11.0)

## 2021-08-24 LAB — PHOSPHORUS: Phosphorus: 3.1 mg/dL (ref 2.5–4.9)

## 2021-08-24 LAB — MAGNESIUM: Magnesium: 1.6 mg/dL — ABNORMAL LOW (ref 1.80–2.40)

## 2021-08-24 MED ORDER — JEVITY 1.5 CAL/FIBER PO LIQD
ORAL | 5 refills | 23.00000 days | Status: DC
Start: 2021-08-24 — End: 2022-02-13

## 2021-08-24 MED ORDER — LISINOPRIL 5 MG PO TABS
5 | ORAL_TABLET | Freq: Every day | ORAL | 0 refills | Status: DC
Start: 2021-08-24 — End: 2022-02-13

## 2021-08-24 MED ORDER — MAGNESIUM SULFATE 2000 MG/50 ML IVPB PREMIX
2 GM/50ML | Freq: Once | INTRAVENOUS | Status: AC
Start: 2021-08-24 — End: 2021-08-24
  Administered 2021-08-24: 15:00:00 2000 mg via INTRAVENOUS

## 2021-08-24 MED FILL — THIAMINE HCL 100 MG/ML IJ SOLN: 100 mg/mL | INTRAMUSCULAR | Qty: 1

## 2021-08-24 MED FILL — MAGNESIUM SULFATE 2 GM/50ML IV SOLN: 2 GM/50ML | INTRAVENOUS | Qty: 50

## 2021-08-24 MED FILL — LOVENOX 30 MG/0.3ML IJ SOSY: 30 MG/0.3ML | INTRAMUSCULAR | Qty: 0.3

## 2021-08-24 MED FILL — LISINOPRIL 5 MG PO TABS: 5 mg | ORAL | Qty: 1

## 2021-08-24 MED FILL — ATORVASTATIN CALCIUM 20 MG PO TABS: 20 mg | ORAL | Qty: 1

## 2021-08-24 NOTE — Progress Notes (Signed)
Nutrition Note    RECOMMENDATIONS  PO Diet: Regular  Continue daily Thiamine  Consider daily/stronger bowel regimen as LBM documented 5/22.    Nutrition Support:    Recommend order "Diet: Adult Tube Feed". Jevity 1.5 (standard with fiber formula) at 35 mL/hr and and advance by 10 ml q 8 hr to goal rate of 45 ml/hr.    Recommend 30 mL H20 q 4 hours while receiving IVF (LR @ 75 ml/hr).  Once IVF d/c'd, recommend 135 ml water flush q 4 hr.  Increase flush if Na+ increases greater than 145 mEq/L.  Monitor closely and correct lytes (K, Phos, Mg) before progressing TF to goal d/t risk of refeeding syndrome (hx extended inadequate nutritional intake).  Ensure head of bed is 30 - 45 degrees during continuous or bolus gastric feeding and for one hour after bolus. Turn off the feeding if head of bed is lowered less than 30 degrees.   Monitor for tolerance (bowel habits, N/V, cramping, abdominal exam findings: distended, firm, tense, guarded, discomfort).  Irrigate tube with 30 mL water before, between and after each medication.  If tube becomes clogged, first try flushing with water.  If water does not unclog the tube, may use "clog zapper" at the direction of the physician/LIP. If tube remains clogged, contact Physician/LIP for additional orders.  Closed System Guidelines:  Change tubing and feeding container every 24 hours.    NUTRITION ASSESSMENT   PEG placed to initiate nutrition support d/t pt's lack of oral intake.  Pt remains on a regular diet but not is not eating.  Pt is tolerating Jevity 1.5 @ 35 ml/hr.  Mg low at 1.6 with pharmacy replacing.  Other lytes WNL.  If pt continues to tolerate & maintain lytes WNL, will advance TF to goal rate of 45 ml/hr to meet 100% of estimated energy needs. LBM per EMR was 5/22; consider daily/stronger bowel regimen if not already in place (discussed with RN).  Also suggested d/c LR fluids & increase water flush to 135 ml q 4 hr.  Will con't to monitor TF tolerance & po diet intake.      Nutrition Related Findings: No edema noted;  LBM per EMR 5/22; receives thiamine per pharmacy.  LR @ 75 ml/hr.  k+ & phos WNL & stable; replacing Mg (1.6)  Wounds: None  Nutrition Education:  Education not indicated   Nutrition Goals: Tolerate nutrition support at goal rate     MALNUTRITION ASSESSMENT   Acute Illness  Malnutrition Status: Severe malnutrition  Findings of the 6 clinical characteristics of malnutrition:  Energy Intake:  50% or less of estimated energy requirements for 5 or more days  Weight Loss:  Greater than 5% over 1 month (19% per EMR)     Body Fat Loss:  Moderate body fat loss Orbital, Buccal region   Muscle Mass Loss:  Moderate muscle mass loss Temples (temporalis), Clavicles (pectoralis & deltoids)  Fluid Accumulation:  No significant fluid accumulation    Grip Strength:  Not Performed      NUTRITION DIAGNOSIS   Severe malnutrition related to catabolic illness, inadequate protein-energy intake as evidenced by intake 0-25%, poor intake prior to admission, nutrition support - enteral nutrition, Criteria as identified in malnutrition assessment      CURRENT NUTRITION THERAPIES  ADULT DIET; Regular  ADULT TUBE FEEDING; PEG; Standard with Fiber; Continuous; 35; Yes; 10; Q 8 hours; 45; 30; Q 4 hours     PO Intake: 0%   PO Supplement Intake:None  Ordered    Current Tube Feeding (TF) Orders:  Feeding Route: PEGPEG  Formula: Standard with Fiber  Additives/Modulars: None  Water Flushes: 30 ml water flush q 4 hr until LR d/c'd, then increase water flush to 135 ml q 4 hr.  Current TF & Flush Orders Provides:    Goal TF & Flush Orders Provides: Jevity 1.5 @ goal rate of 45 ml/hr provides 1080 ml; 1620 kcal, 69 g pro & 820 ml free water.    ANTHROPOMETRICS  Current Height: '5\' 3"'$  (160 cm)  Current Weight - Scale: 102 lb 9.6 oz (46.5 kg)    Ideal Body Weight (IBW): 115 lbs  (52 kg)        BMI: 18.1      COMPARATIVE STANDARDS  Energy (kcal):  1410- 1645     Protein (g):  71- 94g       Fluid (mL/day):  1  ml/kcal    The patient will be monitored per nutrition standards of care. Consult dietitian if additional nutrition interventions are needed prior to RD reassessment.     Vincenza Hews, RD, LD    Contact: [phone]

## 2021-08-24 NOTE — Care Coordination-Inpatient (Signed)
Discharge Planning;   CM requested Amber Palmer with [facility] to run patient's Home  IV infusions Benefits for possible home Tube Feeds . Will call back with updates.

## 2021-08-24 NOTE — Discharge Instructions (Addendum)
Continuity of Care Form    Patient Name: Amber Palmer   DOB:  10/22/1929  MRN:  [id number]    Admit date:  08/18/2021  Discharge date:      Code Status Order: DNR-CCA   Advance Directives:   [place] Documentation       Date/Time Healthcare Directive Type of Healthcare Directive Copy in [place] Agent's Name Healthcare Agent's Phone Number    08/22/21 1348 No, patient does not have an advance directive for healthcare treatment -- -- -- -- --            Admitting Physician:  Amber Bison, MD  PCP: Amber Glance, APRN - NP    Discharging Nurse:   [facility] Unit/Room#: 5TN-5579/5579-01  Discharging Unit Phone Number: [phone]    Emergency Contact:   Extended Emergency Contact Information  Primary Emergency Contact: Palmer,Amber  Address: [place] [address]  Home Phone: [phone]  Relation: Child  Secondary Emergency Contact: Amber Palmer  Home Phone: [phone]  Relation: None    Past Surgical History:  Past Surgical History:   Procedure Laterality Date    GASTROSTOMY TUBE PLACEMENT N/A 08/22/2021    EGD PEG TUBE PLACEMENT performed by Amber Reeds, MD at [facility]       Immunization History:   Immunization History   Administered Date(s) Administered    COVID-19, PFIZER Bivalent, DO NOT Dilute, (age 12y+), IM, 47 mcg/0.3 mL 01/06/2021       Active Problems:  Patient Active Problem List   Diagnosis Code    Hypertension I10    Hyperlipidemia E78.5    Vitamin D deficiency E55.9    Pancreatic cancer ([place]) C25.9    Failure to thrive in adult R62.7    Severe malnutrition ([place]) E43    Weight loss R63.4    Elevated lipase R74.8    UTI (urinary tract infection) N39.0       Isolation/Infection:   Isolation            No Isolation          Patient Infection Status       None to display            Nurse Assessment:  Last Vital Signs: BP 135/69   Pulse 85   Temp 98.5 F (36.9 C) (Oral)   Resp 16   Ht '5\' 3"'$  (1.6 m)    Wt 102 lb 9.6 oz (46.5 kg)   SpO2 97%   BMI 18.17 kg/m     Last documented pain score (0-10 Palmer): Pain Level: 6  Last Weight:   Wt Readings from Last 1 Encounters:   08/18/21 102 lb 9.6 oz (46.5 kg)     Mental Status:  oriented and alert    IV Access:  - None    Nursing Mobility/ADLs:  Walking   Assisted  Transfer  Assisted  Bathing  Assisted  Dressing  Assisted  Toileting  Assisted  Feeding  Independent  Med Admin  Assisted  Med Delivery   Whole may also use PEG    Wound Care Documentation and Therapy:        Elimination:  Continence:   Bowel: {YES / DD:22025}  Bladder: {YES / KY:70623}  Urinary Catheter: None   Colostomy/Ileostomy/Ileal Conduit: No       Date of Last BM: 08/14/21  No intake or output data in  the 24 hours ending 08/24/21 0919  I/O last 3 completed shifts:  In: 60 [NG/GT:60]  Out: -     Safety Concerns:     At Risk for Falls    Impairments/Disabilities:      None    Nutrition Therapy:  Current Nutrition Therapy:   - Oral Diet:  General  - Tube Feedings:  Standard with fiber 35 ml/hr with 14m flush Q4    Test Ordered: ADULT TUBE FEEDING; PEG; Standard with Fiber; Continuous; 35; Yes; 10; Q 8 hours; 45; 30; Q 4 hours [DNS10]   Code: DUVOZ366  ORD #: [id number] Associated Diagnosis:   Comments: Dietary to send 2 liters Jevity 1.5 daily  Enteral Access: PEG  Formula: Standard with Fiber  Should patient be NPO or Oral Diet: Oral Diet  Delivery Method: Continuous  Continuous Initial Rate (mL/hr): 35  Continuous Advance Tube Feeding: Yes  Advancement Volume (mL/hr): 10  Advancement Frequency: Q 8 hours  Continuous Goal Rate (mL/hr): 45  Water Flush Volume (mL): 30  Water Flush Frequency: Q 4 hours          Routes of Feeding: Oral and Gastrostomy Tube  Liquids: Thin Liquids  Daily Fluid Restriction: no  Last Modified Barium Swallow with Video (Video Swallowing Test): not done    Treatments at the Time of Hospital Discharge:   Respiratory Treatments:   Oxygen Therapy:  is not on home oxygen  therapy.  Ventilator:    - No ventilator support    Rehab Therapies: Physical Therapy and Occupational Therapy  Weight Bearing Status/Restrictions: No weight bearing restrictions  Other Medical Equipment (for information only, NOT a DME order):  walker  Other Treatments:     Patient's personal belongings (please select all that are sent with patient):  None    RN SIGNATURE:  Electronically signed by Amber Scale RN on 08/24/21 at 3:09 PM EDT    CASE MANAGEMENT/SOCIAL WORK SECTION    Inpatient Status Date: 08/18/21    Readmission Risk Assessment Score:  Readmission Risk              Risk of Unplanned Readmission:  14           [facility]charging [facility] Facility/ Agency   [facility] [address] [address] Phone [phone] Fax [phone]    Home Tube Feeds via   [facility] Phone: [phone] Fax: [phone]      Dialysis Facility (if applicable)   Name:  Address:  Dialysis Schedule:  Phone:  Fax:    Case Manager/Social Worker signature: Electronically signed by CRebekah Chesterfield RN/CCM  08/25/21    PHYSICIAN SECTION    Prognosis: Fair    Condition at Discharge: Stable    Rehab Potential (if transferring to Rehab): Fair    Recommended Labs or Other Treatments After Discharge: Please refer to Home care orders    Physician Certification: I certify the above information and transfer of Amber Palmer is necessary for the continuing treatment of the diagnosis listed and that she requires Home Care for greater 30 days.     Update Admission H&P: No change in H&P    PHYSICIAN SIGNATURE:  Electronically signed by PLoleta Dicker MD on 08/24/21 at 3:33 PM EDT

## 2021-08-24 NOTE — Care Coordination-Inpatient (Signed)
Discharge Planning;  CM received a call from [place] with [facility] stating that the patient's insurance [facility] [employer] program) requires prior authorization  for the [facility] services prior to delivering the services. The hhc care agency is initiating the prior authorization  process, cannot provide the services until the approval is obtained.  Brandy with [employer] was updated , stated cannot do a delivery of the tube feeds without a hhc agency following.  Therefore patient not able to be discharged home today.   Patient son [facility]) was update , Patient's RN and the Attending MD via perfect serve. . Will continue to follow.

## 2021-08-24 NOTE — Discharge Summary (Signed)
Discharge Summary           Name:  Amber Palmer DOB/Age/Sex: [birth year]  (88 y.o. female)   MRN & CSN:  [id number] & [id number] Admission Date/Time: 08/18/2021  3:54 PM   Attending:  Loleta Dicker, MD Discharging Physician: Loleta Dicker, MD     Hospital Course:     Wilmetta Speiser is a 88 y.o.  female with past medical history of hypertension/hyperlipidemia was admitted on 08/18/2021 for evaluation of increased weakness/weight loss.  Noted to have abnormal urinalysis suggestive of UTI.  Started on IV antibiotic however, urine culture shows no growth..  Initial CT abdomen suggestive of hypodense lesion near pancreatic head.  Subsequent MRCP showed pancreatic head mass.  Seen by gastroenterology who recommended EUS as outpatient.  EGD showed Zenker's diverticulum/3 gastric nodule/2 duodenal nodule/duodenal AVM.  PEG tube was placed.  Patient was started on tube feeds.  Patient tolerated well.  As per GI, patient is okay to eat as well.  Patient was evaluated by PT/OT, initially recommended SNF.,  However refused, wants to go home, hence, patient is currently being discharged in stable condition on 08/24/2021 to home with home health.  Recommend to follow-up with PCP/GI/oncology as outpatient.  The patient expressed appropriate understanding of and agreement with the discharge recommendations, medications, and plan.     Discharge diagnosis    Pancreatic head mass-concern for pancreatic adenocarcinoma  Anorexia/weight loss  Abnormal urinalysis, likely asymptomatic bacteriuria  Hypertension  Hyperlipidemia    Consults this admission:  IP CONSULT TO HEM/ONC  IP CONSULT TO GI  IP CONSULT TO PALLIATIVE CARE  IP CONSULT TO HOSPICE  IP CONSULT TO DIETITIAN  IP CONSULT TO HOME CARE NEEDS        Discharge Medications:        Medication List        START taking these medications      Jevity 1.5 Cal/Fiber Liqd  Continuous feed at 45 ml/hr.    135 ml water flush q 4 hr.  Increase flush if Na+ increases greater than 145 mEq/L.            CHANGE  how you take these medications      lisinopril 5 MG tablet  Commonly known as: PRINIVIL;ZESTRIL  Take 1 tablet by mouth daily  Start taking on: August 25, 2021  What changed:   medication strength  how much to take            CONTINUE taking these medications      atorvastatin 20 MG tablet  Commonly known as: Lipitor  Take 1 tablet by mouth daily     Calcium Ascorbate 500 MG Tabs     calcium carbonate 500 MG Tabs tablet  Commonly known as: OSCAL     vitamin B-1 100 MG tablet  Commonly known as: THIAMINE     vitamin C 250 MG tablet     vitamin E 1000 units capsule            STOP taking these medications      naproxen 250 MG tablet  Commonly known as: NAPROSYN     tamsulosin 0.4 MG capsule  Commonly known as: Flomax            ASK your doctor about these medications      vitamin D 50000 UNIT Caps  Commonly known as: CHOLECALCIFEROL  Take 1 capsule by mouth once a week  Where to Get Your Medications        These medications were sent to [facility] [id number] [facility] [place]  [place], [place] [address]      Phone: [phone]   lisinopril 5 MG tablet       You can get these medications from any pharmacy    Bring a paper prescription for each of these medications  Jevity 1.5 Cal/Fiber Liqd          Objective Findings at Discharge:   BP 104/67   Pulse 85   Temp 98.1 F (36.7 C) (Oral)   Resp 16   Ht '5\' 3"'$  (1.6 m)   Wt 102 lb 9.6 oz (46.5 kg)   SpO2 96%   BMI 18.17 kg/m            PHYSICAL EXAM-see today's progress note    BMP/CBC  Recent Labs     08/22/21  0554 08/23/21  0712 08/24/21  0559   NA 137 141 140   K 4.0 3.9 3.6   CL 104 105 106   CO2 '27 24 29   '$ BUN 7 5* 6*   CREATININE 0.7 0.7 0.6   WBC 5.0 6.9 6.0   HCT 35.6* 35.8* 33.6*   PLT 125* 124* 128*       Discharge Time of 35 minutes    Electronically signed by Loleta Dicker, MD on 08/24/2021 at 1:21 PM

## 2021-08-24 NOTE — Care Coordination-Inpatient (Signed)
Discharge Planning;  [employer] has run the benefits for home Tube Feeds  it is covered and patient will be charged $ 8.00 per day for the feeds and the Supplies. Brandy  called and updated patient's son- [facility]) also discussed on the delivery of the feeds &  and  he was in agreement . The home health care provider will be [place].    CM also spoke with patient's son who reported him and his Brother Cecilie Lowers live with the patient  and Cecilie Lowers will transport patient home at the time of discharge.

## 2021-08-24 NOTE — Progress Notes (Addendum)
[facility] Department   Phone: [phone]    Occupational Therapy    '[]'$  Initial Evaluation            '[x]'$  Daily Treatment Note         '[]'$  Discharge Summary      Patient: Amber Palmer   DOB: February 18, 1930   MRN: [id number]   Date of Service:  08/24/2021    Admitting Diagnosis: Pancreatic cancer [facility])  Current Admission Summary: Pt is a 88 y/o. Female who presents with generalized weakness.  Past Medical History:  has a past medical history of Hyperlipidemia, Hypertension, Pancreatic cancer ([place]), and Vitamin D deficiency.  Past Surgical History:  has no past surgical history on file.  Discharge Recommendations: Datra Clary scored a 13/24 on the AM-PAC ADL Inpatient form. Current research shows that an AM-PAC score of 17 or less is typically not associated with a discharge to the patient's home setting. Based on the patient's AM-PAC score and their current ADL deficits, it is recommended that the patient have 3-5 sessions per week of Occupational Therapy at d/c to increase the patient's independence.  Please see assessment section for further patient specific details. Patient may refuse this recommendation, if refusal 24/7 supervision with [place].      HOME HEALTH CARE: LEVEL 3 SAFETY     - Initial home health evaluation to occur within 24-48 hours, in patient home   - Therapy evaluations in home within 24-48 hours of discharge; including DME and home safety   - Frontload therapy 5 days, then 3x a week   - Therapy to evaluate if patient has [place] needs for personal care   - Social Worker evaluation within 24-48 hours, includes evaluation of resources and insurance to determine [place], [place], LTC, and Medicaid options   - 24/7 Supervision     If patient discharges prior to next session this note will serve as a discharge summary.  Please see below for the latest assessment towards goals.     DME Required For Discharge: rolling walker  Precautions/Restrictions: high fall risk  Weight  Bearing Restrictions: no restrictions     Required Braces/Orthotics: no braces required                     Positional Restrictions:no positional restrictions     Pre-Admission Information   Lives With: son; one son is home all the time while the other works                         Type of Home: house  Home Layout: one level  Home Access: level entry  [employer]: tub/shower unit  [employer]:  No equipment in bathroom; Pt does not use the shower; she has been using a bucket & sponge bath for years  Toilet Height: standard height  Home Equipment:  Pt states her son used a rolling walker/cane, and they still have it but he is very tall and not a height for her  Transfer Assistance: Independent without use of device  Ambulation Assistance:Independent without use of device  ADL Assistance: independent with all ADL's  IADL Assistance: requires assistance with all homemaking tasks, Pt states her sons do most of the household chores, she "does not do anything "  Active Driver:        '[]'$  Yes                 '[x]'$  No  Hand Dominance: '[]'$  Left                 '[x]'$  Right  Current Employment: retired.  Occupation: Nurse's aid  Hobbies: watching TV, "I can't do much at my age"   Recent Falls: No falls     Examination   Vision:   Vision Corrective Device: wears glasses for reading  Hearing:   WFL  Posture:   Pt demonstrates a very forward head and tends to look down frequently. The pt does not look up unless verbally cued to do so. Pt has a kyphotic posture and a very narrow BOS when standing.  Sensation:   WFL and denies numbness and tingling  Proprioception:    WFL  Tone:   Normotonic  Coordination Testing:   WFL    ROM:   (B) UE AROM WFL  Strength:   (B) UE strength grossly WFL    Therapist Clinical Decision Making (Complexity): medium complexity  Clinical Presentation: evolving      Subjective  General: Patient supine in bed upon arrival, agreeable to therapy treatment session this date  Pain: 0/10  Pain  Interventions: not applicable        Activities of Daily Living  Basic Activities of Daily Living  Feeding: dependent  Feeding Comments: PEG Tube feed  Grooming: stand by assistance  Grooming Comments: SBA for grooming tasks in stance at sink washed hands and rinsed mouth out with mouthwash, seated EOB applied deodorant, increased time needed for thoroughness and task completion, patient with minimal verbal cueing needed for hand placement, sequencing, spatial awareness, and line management  Upper Extremity Bathing: stand by assistance  Bathing Equipment: none  Bathing Comments: SBA for washing face, hands, arms, and chest with warm wipes, increased time needed for thoroughness, patient with slowed movements and minimal verbal cueing needed for hand placement, sequencing, and task completion  Upper Extremity Dressing: contact guard assistance  Lower Extremity Dressing: maximum assistance requires verbal cueing Increased time to complete task  Dressing Equipment: none  Dressing Comments: CGA for donning/doffing gown, IV line management and PEG tube management and maxA for donning/doffing brief and socks, patient with minimal verbal cueing needed for hand placement, sequencing, line management, initiation for increased safety with functional dressing  Toileting: maximum assistance requires verbal cueing Increased time to complete task.    Toileting Equipment: none  Toileting Comments: purwick in place upon arrival, maxA for clothing management, CGA to minA for toilet hygiene, 1 LOB with toilet hygiene minA to correct, patient voided of urine, increased time needed for task completion  General Comments: Patient with increased time needed for all functional ADL activities, patient with slowed movements.  Patient with minimal verbal and tactile cueing needed for hand placement, sequencing, initiation, line management and task completion for increased safety with functional ADLs.  Instrumental Activities of Daily  Living  No IADL completed on this date.    Functional Mobility  Bed Mobility  Supine to Sit: stand by assistance  Comments:Patient with increased time, HOB elevated, bed rail and minimal verbal cueing needed for IV management and Tube feed management  Transfers  Sit to stand transfer:stand by assistance  Stand to sit transfer: contact guard assistance  Toilet transfer: contact guard assistance  Toilet transfer equipment: standard bedside commode, grab bars  Toilet transfer comments: R hand rail  used and IV pole   Comments: Patient with minimal verbal and tactile cueing needed for hand placement, sequencing, initiation, line management, spatial awareness for increased safety  with functional transfers, patient with CGA progressing to SBA  Functional Mobility:  Sitting Balance: supervision.    Sitting Balance Comment: seated EOB, on BSC  Standing Balance: stand by assistance, contact guard assistance.    Standing Balance Comment: posterior lean  Functional Mobility: .  contact guard assistance, minimal assistance  Functional Mobility Activity: to/from bathroom  Functional Mobility Device Use: hand-held assist  Functional Mobility Comment: Patient ambulated ~80f in room and to/from bathroom, patient with HHA progressing to no device, patient with CGA progressing to minA for functional mobility.  Patient with minimal verbal cueing needed for hand placement, sequencing, initiation, spatial awareness, line management for increased safety with functional mobility    Other Therapeutic Interventions    Functional Outcomes  AM-PAC Inpatient Daily Activity Raw Score: 13    Cognition  Overall Cognitive Status: Impaired  Following Commands: follows one step commands with repetition, follows one step commands with increased time  Memory: decreased recall of recent events, decreased short term memory  Safety Judgement: decreased awareness of need for assistance, decreased awareness of need for safety  Problem Solving: assistance  required to generate solutions, assistance required to implement solutions, assistance required to identify errors made, assistance required to correct errors made  Insights: decreased awareness of deficits  Initiation: requires cues for some  Sequencing: requires cues for some  Comments: Patient with frequent cueing needed for spatial awareness and line management  Orientation:    alert and oriented x 4  Command Following:   [facility]    Education  Barriers To Learning: cognition  Patient Education: patient educated on goals, OT role and benefits, plan of care, precautions, ADL adaptive strategies, energy conservation, disease specific education, transfer training, discharge recommendations  Learning Assessment:  patient verbalizes understanding, would benefit from continued reinforcement    Assessment  Activity Tolerance: Patient tolerated treatment well, with some soreness at PEG Tube site  Impairments Requiring Therapeutic Intervention: decreased functional mobility, decreased ADL status, decreased safety awareness, decreased endurance, decreased balance, decreased IADL, decreased posture  Prognosis: good  Clinical Assessment: Patient presents with the above deficits, which are below baseline, and would benefit from continued skilled OT to address.  Patient with 1 LOB with minA to correct.  Patient with CGA to minA for functional mobility, SBA to CGA for functional transfers, and SBA to maxA for ADLs.    Safety Interventions: patient left in chair, chair alarm in place, call light within reach, patient at risk for falls, and nurse notified    Plan  Frequency: 3-5 x/per week  Current Treatment Recommendations: strengthening, balance training, functional mobility training, transfer training, endurance training, patient/caregiver education, ADL/self-care training, safety education, and equipment evaluation/education    Goals  Patient Goals: Return home   Short Term Goals:  Time Frame: Upon discharge  Patient will  complete upper body ADL at set up assistance   Patient will complete lower body ADL at minimal assistance   Patient will complete toileting at stand by assistance   Patient will complete functional transfers at stand by assistance   Patient will complete functional mobility at stand by assistance   Patient will increase functional standing balance to 6-8 minutes SBA for improved ADL completion  Patient will complete bed mobility at modified independent      Above goals reviewed on 08/24/2021.  All goals are ongoing at this time unless indicated above.     Therapy Session Time     Individual Group Co-treatment   Time In 0825  Time Out 0907     Minutes 42          Timed Code Treatment Minutes:   42 minutes  Total Treatment Minutes:  42 minutes        Electronically Signed By: Leia Alf, OTR/L [id number]

## 2021-08-24 NOTE — Care Coordination-Inpatient (Signed)
[facility]    Received referral for homecare services. [facility] nable to service, [facility] has accepted and will pull documents from Epic at discharge. Electronically signed by Lucienne Capers, LPN on 03/31/08 at 9:60 AM EDT         [facility] Transition Nurse  [phone]

## 2021-08-24 NOTE — Progress Notes (Signed)
[place] Department   Phone: [phone]    Physical Therapy    '[]'$  Initial Evaluation            '[x]'$  Daily Treatment Note         '[]'$  Discharge Summary      Patient: Amber Palmer   DOB: 20-Sep-1929   MRN: [id number]   Date of Service:  08/24/2021  Admitting Diagnosis: Pancreatic cancer [facility])  Current Admission Summary: Pt is a 88 y/o. Female who presents with generalized weakness.  Found to have a pancreatic mass.  Difficulty eating so PEG was placed on 08/22/21.  Past Medical History:  has a past medical history of Hyperlipidemia, Hypertension, Pancreatic cancer ([place]), and Vitamin D deficiency.  Past Surgical History:  has a past surgical history that includes Gastrostomy tube placement (N/A, 08/22/2021).  Discharge [place] scored a 18/24 on the AM-PAC short mobility form. Current research shows that an AM-PAC score of 18 or greater is typically associated with a discharge to the patient's home setting. Based on the patient's AM-PAC score and their current functional mobility deficits, it is recommended that the patient have 2-3 sessions per week of Physical Therapy at d/c to increase the patient's independence.  At this time, this patient demonstrates the endurance and safety to discharge home with home services and a follow up treatment frequency of 2-3x/wk.  Please see assessment section for further patient specific details.    If patient discharges prior to next session this note will serve as a discharge summary.  Please see below for the latest assessment towards goals.        DME Required For Discharge: rolling walker  Precautions/Restrictions: high fall risk  Weight Bearing Restrictions: no restrictions    Required Braces/Orthotics:  loose abdominal binder noted in orders, pt stated she has not worn one since surgery     Positional Restrictions:no positional restrictions    Pre-Admission Information   Lives With: son; one son is home all the time while the  other works     Type of Home: house  Home Layout: one level  Home Access: level entry  [employer]: tub/shower unit  [employer]:  No equipment in bathroom; Pt does not use the shower; she has been using a bucket & sponge bath for years  Toilet Height: standard height  Home Equipment:  Pt states her son used a rolling walker/cane, and they still have it but he is very tall and not a height for her  Transfer Assistance: Independent without use of device  Ambulation Assistance:Independent without use of device  ADL Assistance: independent with all ADL's  IADL Assistance: requires assistance with all homemaking tasks, Pt states her sons do most of the household chores, she "does not do anything "  Active Driver:        '[]'$  Yes  '[x]'$  No  Hand Dominance: '[]'$  Left  '[x]'$  Right  Current Employment: retired.  Occupation: Nurse's aid  Hobbies: watching TV, "I can't do much at my age"   Recent Falls: No falls    Examination   Vision:   Vision Corrective Device: wears glasses for reading  Hearing:   WFL  Posture:   Pt demonstrates a very forward head and tends to look down frequently. The pt does not look up unless verbally cued to do so. Pt has a kyphotic posture and a narrow BOS when standing.  Sensation:   WFL and denies numbness and tingling  Proprioception:  WFL  Tone:   Normotonic  Coordination Testing:   WFL    ROM:   (B) LE AROM WFL  (B) Hip AROM WFL; Has some compensation with a posterior lean  Strength:   (B) LE strength grossly -4  Therapist Clinical Decision Making (Complexity): medium complexity  Clinical Presentation: evolving      Subjective  General: Pt was sitting in chair upon PT arrival, agreeable to PT, denied pain  Pain: 0/10  Pain Interventions: not applicable       Functional Mobility  Bed Mobility  Bed mobility not completed on this date.  Comments:   Transfers  Sit to stand transfer: contact guard assistance, frequent VC needed for hand placement as pt reaches for walker  Stand to sit transfer:  contact guard assistance  Comments:   Ambulation  Surface:level surface  Assistive Device: rolling walker  Assistance: contact guard assistance  Distance: 60 'x 2   Gait mechanics: Pt demonstrated decreased cadence, decreased stride length and a slow pace overall. Pt demonstrated a narrow BOS but showed improvement since Tuesday. Pt walks with her head down, looking at her feet. Pt required increased time to move.  Comments: Pt was verbally cued to take bigger steps and increase cadence, able to maintain for around 15' at a time    Stair Mobility  Stair mobility not completed on this date.  Comments:  Wheelchair Mobility:  No w/c mobility completed on this date.  Comments:  Balance  Static Sitting Balance: fair (-): maintains balance at CGA with use of UE support  Dynamic Sitting Balance: fair (-): maintains balance at CGA with use of UE support  Static Standing Balance: fair (-): maintains balance at CGA with use of UE support  Dynamic Standing Balance: fair (-): maintains balance at CGA with use of UE support  Comments:     Other Therapeutic Interventions    Sit to/from stand x10 reps.  R hip ABD with weight shift on/off R foot, x15 reps.  Completed again x15 reps on the LLE, CGA, BUE support.  Standing alternating hip flexion x20 reps with use of RW for balance, intermittent posterior lean, min A to correct.  Standing without BUE support x45 seconds, CGA to min A for posterior lean.  Knee LAQ x15 reps BLE.    Functional Outcomes  AM-PAC Inpatient Mobility Raw Score : 18              Cognition  WFL  Orientation:    alert and oriented x 4  Command Following:   [facility]    Education  Barriers To Learning: none  Patient Education: patient educated on goals, PT role and benefits, plan of care, general safety, energy conservation  Learning Assessment:  patient verbalizes and demonstrates understanding    Assessment  Activity Tolerance: Good  Impairments Requiring Therapeutic Intervention: decreased functional mobility,  decreased strength, decreased safety awareness, decreased endurance, decreased balance  Prognosis: good  Clinical Assessment: Pt is a 88 y/o. Female with generalized weakness, decreased endurance, decreased safety awareness and decreased balance. She is at a high risk of falls due to her decreased cadence during gait.  Pt would benefit from 24 hour assistance and HHPT to decrease the risk of falls and regain strength and endurance needed for ADLs and mobility around her home before returning home. Pt has good family support from her sons.  She has refused a SNF for DC.   Safety Interventions: patient left in chair, chair alarm in place, call light  within reach, patient at risk for falls, and nurse notified    Plan  Frequency: 3-5 x/per week  Current Treatment Recommendations: strengthening, balance training, functional mobility training, endurance training, and patient/caregiver education    Goals  Patient Goals: to go back home with her sons  Short Term Goals:  Time Frame: until discharge  Patient will complete bed mobility at supervision   Patient will complete transfers at stand by assistance   Patient will ambulate 100 ft with use of rolling walker at contact guard assistance     Above goals reviewed on 08/24/2021.  All goals are ongoing at this time unless indicated above.    Therapy Session Time      Individual Group Co-treatment   Time In 1020       Time Out 1049       Minutes 29         Timed Code Treatment Minutes:  29 Minutes  Total Treatment Minutes:  29       Electronically Signed By: Kevan Ny, PT DPT [phone]

## 2021-08-24 NOTE — Progress Notes (Signed)
Hospitalist Progress Note      Name:  Amber Palmer DOB/Age/Sex: 17-May-1929  (88 y.o. female)   MRN & CSN:  [id number] & [id number] Admission Date/Time: 08/18/2021  3:54 PM   Location:  5TN-5579/5579-01 PCP: Valli Glance, APRN - NP         Hospital Day: 7    Assessment and Plan:   Amber Palmer is a 88 y.o.  female with past medical history of hypertension/hyperlipidemia was admitted on 08/18/2021 for evaluation of increased weakness/weight loss.  Noted to have abnormal urinalysis suggestive of UTI.  Started on IV antibiotic however, urine culture shows no growth..  Initial CT abdomen suggestive of hypodense lesion near pancreatic head.  Subsequent MRCP showed pancreatic head mass.  Seen by gastroenterology who recommended EUS.  EGD showed Zenker's diverticulum/3 gastric nodule/2 duodenal nodule/duodenal AVM.  PEG tube was placed.  Patient was started on tube feeds.    Assessment    Pancreatic head mass-concern for pancreatic adenocarcinoma  Anorexia/weight loss  Abnormal urinalysis, likely asymptomatic bacteriuria  Hypertension  Hyperlipidemia    Plan    GI evaluation appreciated.  Plan for EUS as outpatient.  EGD noted as above.  Status post PEG tube placement.  Continue tube feeds  As per GI, okay for oral intake.    Continue IV hydration  Continue lisinopril/Lipitor    Discussed with dietitian      Diet ADULT DIET; Regular  ADULT TUBE FEEDING; PEG; Standard with Fiber; Continuous; 35; Yes; 10; Q 8 hours; 45; 30; Q 4 hours   DVT Prophylaxis '[x]'$  Lovenox, '[]'$   Heparin, '[]'$  SCDs, '[]'$  Ambulation   GI Prophylaxis '[]'$  PPI,  '[]'$  H2 Blocker,  '[]'$  Carafate,  '[]'$  Diet/Tube Feeds   Code Status DNR-CCA   Disposition Patient requires continued admission due to likely discharge home today with HHA.   MDM '[]'$  Low, '[]'$  Moderate,'[x]'$   High  Patient's risk as above      History of Present Illness:     Chief Complaint: Pancreatic cancer [facility])    Patient seen and examined this morning.  Wants to go home.  Denies any abdominal pain, nausea or  vomiting.  Reports she can tolerate oral intake.    Objective:   No intake or output data in the 24 hours ending 08/24/21 0835   Vitals:   Vitals:    08/24/21 0430   BP: 135/69   Pulse: 85   Resp:    Temp: 98.5 F (36.9 C)   SpO2: 97%     Physical Exam:   GEN Awake female, sitting upright in bed in no apparent distress. Appears given age.  RESP bilateral entry fair.  CARDIO/VASC S1/S2 auscultated. Regular rate  No peripheral edema.  GI Abdomen is soft status post PEG tube placement  NEURO AOx3.  No gross neurological deficit evident.    Medications:   Medications:    thiamine  100 mg IntraVENous Daily    enoxaparin  30 mg SubCUTAneous Daily    lisinopril  5 mg Oral Daily    sodium chloride flush  5-40 mL IntraVENous 2 times per day    atorvastatin  20 mg Oral Daily      Infusions:    lactated ringers IV soln 75 mL/hr at 08/22/21 1652    sodium chloride Stopped (08/22/21 1553)     PRN Meds: sodium chloride flush, 5-40 mL, PRN  sodium chloride, , PRN  ondansetron, 4 mg, Q8H PRN   Or  ondansetron, 4 mg, Q6H PRN  polyethylene glycol, 17 g, Daily PRN  acetaminophen, 650 mg, Q6H PRN   Or  acetaminophen, 650 mg, Q6H PRN          Electronically signed by Loleta Dicker, MD on 08/24/2021 at 8:35 AM

## 2021-08-25 LAB — CBC
Hematocrit: 31.3 % — ABNORMAL LOW (ref 36.0–48.0)
Hemoglobin: 10.4 g/dL — ABNORMAL LOW (ref 12.0–16.0)
MCH: 33 pg (ref 26.0–34.0)
MCHC: 33.3 g/dL (ref 31.0–36.0)
MCV: 98.9 fL (ref 80.0–100.0)
MPV: 9.7 fL (ref 5.0–10.5)
Platelets: 125 10*3/uL — ABNORMAL LOW (ref 135–450)
RBC: 3.17 M/uL — ABNORMAL LOW (ref 4.00–5.20)
RDW: 12.9 % (ref 12.4–15.4)
WBC: 5.9 10*3/uL (ref 4.0–11.0)

## 2021-08-25 LAB — BASIC METABOLIC PANEL
Anion Gap: 5 (ref 3–16)
BUN: 8 mg/dL (ref 7–20)
CO2: 32 mmol/L (ref 21–32)
Calcium: 8.7 mg/dL (ref 8.3–10.6)
Chloride: 106 mmol/L (ref 99–110)
Creatinine: 0.6 mg/dL (ref 0.6–1.2)
Est, Glom Filt Rate: >60 (ref >60)
Glucose: 150 mg/dL — ABNORMAL HIGH (ref 70–99)
Potassium: 4 mmol/L (ref 3.5–5.1)
Sodium: 143 mmol/L (ref 136–145)

## 2021-08-25 LAB — PHOSPHORUS: Phosphorus: 3 mg/dL (ref 2.5–4.9)

## 2021-08-25 LAB — MAGNESIUM: Magnesium: 2.1 mg/dL (ref 1.80–2.40)

## 2021-08-25 MED FILL — LISINOPRIL 5 MG PO TABS: 5 mg | ORAL | Qty: 1

## 2021-08-25 MED FILL — LOVENOX 30 MG/0.3ML IJ SOSY: 30 MG/0.3ML | INTRAMUSCULAR | Qty: 0.3

## 2021-08-25 MED FILL — THIAMINE HCL 100 MG/ML IJ SOLN: 100 mg/mL | INTRAMUSCULAR | Qty: 1

## 2021-08-25 MED FILL — ATORVASTATIN CALCIUM 20 MG PO TABS: 20 mg | ORAL | Qty: 1

## 2021-08-25 NOTE — Care Coordination-Inpatient (Addendum)
Discharge Planning;  CM spoke with Amber Palmer from [facility] regarding the pre-authorization status, she  reported it is still pending  . She was calling [employer]  for possible another hhc agency to follow patient.  Will continue to follow.      Updates 1400; CM received a call back from [place] with  [place] stating that [employer] will provide the services instead of [place] agency.  The Tube feeds will be delivered tonight  at the patient's home and a [place] will be at the patient home tomorrow, she also updated the patient's son Amber Palmer) who was in agreement .   CM also spoke Amber Palmer who confirmed and stated his brother  Amber Palmer) is already here in the hospital and he is on his way bringing patient's clothes .

## 2021-08-25 NOTE — Plan of Care (Signed)
Problem: Nutrition Deficit:  Goal: Optimize nutritional status  Outcome: Not Progressing

## 2021-08-25 NOTE — Progress Notes (Signed)
Hospitalist Progress Note      Name:  Amber Palmer DOB/Age/Sex: 03-31-1929  (88 y.o. female)   MRN & CSN:  [id number] & [id number] Admission Date/Time: 08/18/2021  3:54 PM   Location:  5TN-5579/5579-01 PCP: Valli Glance, APRN - NP         Hospital Day: 8    Assessment and Plan:   Amber Palmer is a 88 y.o.  female with past medical history of hypertension/hyperlipidemia was admitted on 08/18/2021 for evaluation of increased weakness/weight loss.  Noted to have abnormal urinalysis suggestive of UTI.  Started on IV antibiotic however, urine culture shows no growth..  Initial CT abdomen suggestive of hypodense lesion near pancreatic head.  Subsequent MRCP showed pancreatic head mass.  Seen by gastroenterology who recommended EUS.  EGD showed Zenker's diverticulum/3 gastric nodule/2 duodenal nodule/duodenal AVM.  PEG tube was placed.  Patient was started on tube feeds.    Assessment    Pancreatic head mass-concern for pancreatic adenocarcinoma  Anorexia/weight loss  Abnormal urinalysis, likely asymptomatic bacteriuria  Hypertension  Hyperlipidemia    Plan    GI evaluation appreciated.  Plan for EUS as outpatient.  EGD noted as above.  Status post PEG tube placement.  Continue tube feeds  As per GI, okay for oral intake.     Continue IV hydration  Continue lisinopril/Lipitor  Discussed with dietitian.       Diet ADULT DIET; Regular  ADULT TUBE FEEDING; PEG; Standard with Fiber; Continuous; 35; Yes; 10; Q 8 hours; 45; 30; Q 4 hours   DVT Prophylaxis '[x]'$  Lovenox, '[]'$   Heparin, '[]'$  SCDs, '[]'$  Ambulation   GI Prophylaxis '[]'$  PPI,  '[]'$  H2 Blocker,  '[]'$  Carafate,  '[]'$  Diet/Tube Feeds   Code Status DNR-CCA   Disposition Patient requires continued admission due to likely discharge home today with HHA.  Awaiting prior authorization.  Patient medically stable to be discharged.   MDM '[]'$  Low, '[]'$  Moderate,'[x]'$   High  Patient's risk as above      History of Present Illness:     Chief Complaint: Pancreatic cancer [facility])    Patient seen and  examined this morning.  Wants to go home.  Patient's son present in the room.  Denies any abdominal pain, nausea or vomiting.  Reports she can tolerate oral intake.    Objective:     Intake/Output Summary (Last 24 hours) at 08/25/2021 1410  Last data filed at 08/24/2021 1530  Gross per 24 hour   Intake 3609.38 ml   Output --   Net 3609.38 ml      Vitals:   Vitals:    08/25/21 0915   BP: 115/69   Pulse: 81   Resp: 16   Temp:    SpO2: 96%     Physical Exam:   GEN Awake female, sitting upright in bed in no apparent distress. Appears given age.  RESP bilateral entry fair.  CARDIO/VASC S1/S2 auscultated. Regular rate  No peripheral edema.  GI Abdomen is soft status post PEG tube placement  NEURO AOx3.  No gross neurological deficit evident.    Medications:   Medications:    thiamine  100 mg IntraVENous Daily    enoxaparin  30 mg SubCUTAneous Daily    lisinopril  5 mg Oral Daily    sodium chloride flush  5-40 mL IntraVENous 2 times per day    atorvastatin  20 mg Oral Daily      Infusions:    lactated ringers IV soln 75 mL/hr at 08/24/21  1544    sodium chloride Stopped (08/22/21 1553)     PRN Meds: sodium chloride flush, 5-40 mL, PRN  sodium chloride, , PRN  ondansetron, 4 mg, Q8H PRN   Or  ondansetron, 4 mg, Q6H PRN  polyethylene glycol, 17 g, Daily PRN  acetaminophen, 650 mg, Q6H PRN   Or  acetaminophen, 650 mg, Q6H PRN          Electronically signed by Loleta Dicker, MD on 08/25/2021 at 2:10 PM

## 2021-08-25 NOTE — Plan of Care (Signed)
Problem: Gastrointestinal - Adult  Goal: Maintains adequate nutritional intake  08/25/2021 0206 by Jeni Salles, Reganne Messerschmidt  Outcome: Progressing  08/25/2021 0205 by Jeni Salles, Novah Nessel  Outcome: Adequate for Discharge     Problem: Musculoskeletal - Adult  Goal: Return mobility to safest level of function  08/25/2021 0206 by , Naylani Bradner  Outcome: Progressing  08/25/2021 0205 by Jeni Salles, Mikeria Valin  Outcome: Progressing     Problem: Nutrition Deficit:  Goal: Optimize nutritional status  08/25/2021 0206 by Jeni Salles, Mariely Mahr  Outcome: Progressing  08/25/2021 0205 by Jeni Salles, Marrianne Sica  Outcome: Not Progressing     Problem: Pain  Goal: Verbalizes/displays adequate comfort level or baseline comfort level  08/25/2021 0206 by Jeni Salles, Kasyn Rolph  Outcome: Adequate for Discharge  08/25/2021 0205 by Jeni Salles, Jaque Dacy  Outcome: Adequate for Discharge     Problem: Skin/Tissue Integrity  Goal: Absence of new skin breakdown  Description: 1.  Monitor for areas of redness and/or skin breakdown  2.  Assess vascular access sites hourly  3.  Every 4-6 hours minimum:  Change oxygen saturation probe site  4.  Every 4-6 hours:  If on nasal continuous positive airway pressure, respiratory therapy assess nares and determine need for appliance change or resting period.  08/25/2021 0206 by Jeni Salles, Shakima Nisley  Outcome: Adequate for Discharge  08/25/2021 0205 by Jeni Salles, Selma Rodelo  Outcome: Adequate for Discharge     Problem: Safety - Adult  Goal: Free from fall injury  08/25/2021 0206 by Jeni Salles, Rexanne Inocencio  Outcome: Adequate for Discharge  08/25/2021 0205 by Jeni Salles, Lincon Sahlin  Outcome: Adequate for Discharge     Problem: ABCDS Injury Assessment  Goal: Absence of physical injury  08/25/2021 0206 by Jeni Salles, Aziya Arena  Outcome: Adequate for Discharge  08/25/2021 0205 by Jeni Salles, Charmane Protzman  Outcome: Adequate for Discharge     Problem: Skin/Tissue Integrity - Adult  Goal: Skin integrity remains intact  08/25/2021 0206 by Jeni Salles, Grayling Schranz  Outcome: Adequate for Discharge  08/25/2021 0205 by Jeni Salles, Ilyana Manuele  Outcome: Adequate for  Discharge     Problem: Infection - Adult  Goal: Absence of infection at discharge  08/25/2021 0206 by Gary Gabrielsen  Outcome: Adequate for Discharge  08/25/2021 0205 by Jeni Salles, Lupie Sawa  Outcome: Adequate for Discharge     Problem: Neurosensory - Adult  Goal: Achieves maximal functionality and self care  08/25/2021 0206 by Jeni Salles, Librado Guandique  Outcome: Adequate for Discharge  08/25/2021 0205 by Jeni Salles, Gilman Olazabal  Outcome: Adequate for Discharge     Problem: Nutrition Deficit:  Goal: Optimize nutritional status  08/25/2021 0206 by Jeni Salles, Bera Pinela  Outcome: Progressing  08/25/2021 0205 by Jeni Salles,   Outcome: Not Progressing

## 2021-08-25 NOTE — Care Coordination-Inpatient (Signed)
Case Management -  Discharge Note      Patient Name: Amber Palmer                   Date of Birth: 09/02/1929            Readmission Risk (Low < 19, Mod (19-27), High > 27): Readmission Risk Score: 14.4    Current PCP: Valli Glance, APRN - NP    (IMM) Important Message from Medicare:    Date: 08/24/21    PT AM-PAC: 18 /24  OT AM-PAC: 13 /24    Patient/patient representative has been educated on the benefits of Skilled [facility]  as well as the possible risks of declining recommended services. Patient/patient representative has acknowledged the information provided and decided on the following discharge plan. Patient/ patient representative has been provided freedom of choice regarding service provider, supported by basic dialogue that supports the patient's individualized plan of care/goals.    Home Care Information:   Is patient resuming current home health care services: No    Home Care Agency:   [place] [address]  Phone [phone]  Fax [phone]             Services: Skilled Nursing,  PT, OT  Home Health Order Obtained: Yes    Home health agency notified of discharge.   Yes    Tube Feeds  via  [place]  Phone: [phone]  Fax: [phone]     Financial    Payor: [employer]HC MEDICARE / Plan: [facility] AARP MEDICARE ADVANTAGE / Product Type: *No Product type* /     Pharmacy:  Potential assistance Purchasing Medications: No  Meds-to-Beds request:   No      [facility] [id number]  [place] [address]  Phone: [phone] Fax: [phone]      Notes:    Additional Case Management Notes:   Sons will transport patient home.

## 2021-09-13 NOTE — Progress Notes (Signed)
DATE__6/28/23______ TIME__0945____ARRIVAL____0815____   Amber Palmer with pt and son      Nothing to eat or drink after midnight the night before,except for what the prep instructions call for.If you do not have the instructions or do not understand them please contact your doctors office.     Follow any instructions your doctors office has given you including what medications to take the AM of your procedure and which ones to hold.You may use your inhalers - bring rescue inhalers with you DOS.If you take a long acting insulin the eve prior please cut the dose in half and take no diabetic medications that AM.Follow specific doctors office instructions regarding blood thinners and if they want you to hold and for how long. If you are on a blood thinner and have no instructions please contact the office and ask.     Dress comfortably,bring your insurance card,picture ID,and a complete list of medications, including supplements.     You must have a responsible adult to stay with you during the procedure,drive you home and stay with you.     [facility] phone number [phone] for any questions.        OTHER INSTRUCTIONS(if applicable)_____No blood transfusion_________________________________________________           VISITOR POLICY(subject to change)     Current visitor policy is 2 visitors per patient.No children allowed. Mask at discretion of facility.  Visiting hours are 8a-8p. Overnight visitors will be at the discretion of the nurse. All policies are subject to change. \

## 2021-09-19 NOTE — Op Note (Incomplete)
Endoscopy Note    Patient: Amber Palmer   DOB: [birth year]  Acct#:     Procedure: Endoscopic ultrasound  EGD with ***    Surgeon:  Gerrie Nordmann, MD    Referring Physician:  ***    Anesthesia:  MAC per Anesthesia.    Indications: This is a 88 y.o. year old female who presents today with pancreatic head microcystic mass unchanged from 2019.  Also with 3 gastric and 2 duodenal nodules.  Has a zenker's diverticulum.  Has a PEG tube.      Consent: Risks, benefits, and alternatives were explained and informed consent was obtained.      Monitoring:  Patient was monitored with continuous pulse oximetry, telemetry, and intermittent blood pressures.    Details of the Procedure:  The patient was then taken to the endoscopy suite. A time-out was performed. The patient and staff were in agreement as to the correct patient and procedure.  The above anesthesia was administered by the anesthesia department.  The patient was placed in the left lateral position.  The Olympus videoendoscope was placed in the patient's mouth and under direct visualization passed into the esophagus and advanced without difficulty to the 2nd portion of the duodenum.  Views were good, patient toleration was good.  Retroflexion was performed in the stomach.      Findings:  1. The esophagus appeared normal without evidence of Barrett's esophagus or reflux esophagitis.  2.  ***    Next, the curvilinear array echoendoscope was advanced without difficulty to the 2nd portion of the duodenum.  Endosonographic views were good, patient toleration was good.     Findings:   Major Papilla: Endoscopically, the major papilla was ***.  Endosonographically, the major papilla appeared ***  Pancreas: The pancreatic duct measured *** off the major papilla, *** in the head of the pancreas, *** in the body of the pancreas, and *** in the tail of the pancreas.  The pancreatic parenchyma ***  Bile duct: The bile duct was *** off the major papilla and *** in the mid bile duct.   There were no shadowing stones or strictures.  Gallbladder: The gallbladder was ***.  Liver: The liver appeared ***  L adrenal: Normal.  Doppler evaluation of the celiac artery, splenic artery, hepatic artery, superior mesenteric artery, superior mesenteric vein, portal vein, and splenic vein was normal.    The duodenum and stomach were decompressed and the scope was withdrawn from the patient.  The patient tolerated the procedure well and was taken to the post anesthesia care unit in good condition.    Doppler Interpretation:  Doppler evaluation was performed and interpreted on the spot by the endoscopist without the presence of a radiologist.      Specimens taken: ***  Estimated blood loss: ***      Impression:  ***    Recommendations:  1. Clear liquid diet advance as tolerated.    2.  ***    Thomasena Edis MD  Marjory Sneddon

## 2021-09-20 ENCOUNTER — Inpatient Hospital Stay: Payer: Medicare (Managed Care) | Attending: Gastroenterology

## 2021-09-20 ENCOUNTER — Ambulatory Visit: Admit: 2021-09-20 | Primary: Nurse Practitioner

## 2021-09-20 MED ORDER — PROPOFOL 200 MG/20ML IV EMUL
200 MG/20ML | INTRAVENOUS | Status: DC | PRN
Start: 2021-09-20 — End: 2021-09-20
  Administered 2021-09-20: 14:00:00 150 via INTRAVENOUS
  Administered 2021-09-20: 14:00:00 50 via INTRAVENOUS

## 2021-09-20 MED ORDER — SODIUM CHLORIDE 0.9 % IV SOLN
0.9 % | INTRAVENOUS | Status: DC
Start: 2021-09-20 — End: 2021-09-20
  Administered 2021-09-20: 13:00:00 via INTRAVENOUS

## 2021-09-20 MED ORDER — PROPOFOL 200 MG/20ML IV EMUL
200 | INTRAVENOUS | Status: AC
Start: 2021-09-20 — End: 2021-09-20

## 2021-09-20 MED ORDER — SIMETHICONE 40 MG/0.6ML PO SUSP
40 MG/0.6ML | ORAL | Status: DC | PRN
Start: 2021-09-20 — End: 2021-09-20
  Administered 2021-09-20: 15:00:00 60

## 2021-09-20 MED ORDER — LIDOCAINE HCL 2 % IJ SOLN
2 % | INTRAMUSCULAR | Status: DC | PRN
Start: 2021-09-20 — End: 2021-09-20
  Administered 2021-09-20: 14:00:00 100 via INTRAVENOUS

## 2021-09-20 MED ORDER — LIDOCAINE HCL 2 % IJ SOLN
2 | INTRAMUSCULAR | Status: AC
Start: 2021-09-20 — End: 2021-09-20

## 2021-09-20 MED FILL — XYLOCAINE 2 % IJ SOLN: 2 % | INTRAMUSCULAR | Qty: 10

## 2021-09-20 MED FILL — DIPRIVAN 200 MG/20ML IV EMUL: 200 MG/20ML | INTRAVENOUS | Qty: 20

## 2021-09-20 NOTE — Progress Notes (Signed)
Procedure complete, balloon off and intact.  Electronically signed by Debroah Baller, RN on 09/20/2021 at 10:54 AM

## 2021-09-20 NOTE — Progress Notes (Signed)
Pt arrived from endo to PACU bay 8. Reported received from endo rn/crna staff. Pt sedated.  Pt on RA, NSR, and VSS. Will continue to monitor.

## 2021-09-20 NOTE — H&P (Signed)
Pre-operative History and Physical    Patient: Amber Palmer  DOB: [birth year]  Acct#:     HISTORY OF PRESENT ILLNESS:    The patient is a 88 y.o. female who presents with pancreatic head microcystic mass unchanged from 2019.  Also with 3 gastric and 2 duodenal nodules.  Has a zenker's diverticulum.  Has a PEG tube.    Past Medical History:        Diagnosis Date    Hyperlipidemia     Hypertension     Pancreatic cancer ([place]) 08/18/2021    Pancreatitis     Vitamin D deficiency       Past Surgical History:        Procedure Laterality Date    GASTROSTOMY TUBE PLACEMENT N/A 08/22/2021    EGD PEG TUBE PLACEMENT performed by Waynard Reeds, MD at [place]      Medications Prior to Admission:   No current facility-administered medications on Palmer prior to encounter.     Current Outpatient Medications on Palmer Prior to Encounter   Medication Sig Dispense Refill    VITAMIN D PO Take 1 tablet by mouth daily      lisinopril (PRINIVIL;ZESTRIL) 5 MG tablet Take 1 tablet by mouth daily 30 tablet 0    Nutritional Supplements (JEVITY 1.5 CAL/FIBER) LIQD Continuous feed at 45 ml/hr.    135 ml water flush q 4 hr.  Increase flush if Na+ increases greater than 145 mEq/L. 1000 mL 5    Ascorbic Acid (VITAMIN C) 250 MG tablet Take 1 tablet by mouth daily      Calcium Ascorbate 500 MG TABS Take 500 mg by mouth      calcium carbonate (OSCAL) 500 MG TABS tablet Take 1 tablet by mouth daily          Allergies:  Bee venom    Social History:   Social History     Socioeconomic History    Marital status: Divorced     Spouse name: Not on Palmer    Number of children: Not on Palmer    Years of education: Not on Palmer    Highest education level: Not on Palmer   Occupational History    Not on Palmer   Tobacco Use    Smoking status: Never    Smokeless tobacco: Never   Substance and Sexual Activity    Alcohol use: No    Drug use: Never    Sexual activity: Not on Palmer   Other Topics Concern    Not on Palmer   Social History Narrative    Not on Palmer      Social Determinants of Health     Financial Resource Strain: Not on Palmer   Food Insecurity: Not on Palmer   Transportation Needs: Not on Palmer   Physical Activity: Not on Palmer   Stress: Not on Palmer   Social Connections: Not on Palmer   Intimate Partner Violence: Not on Palmer   Housing Stability: Not on Palmer      Family History:   History reviewed. No pertinent family history.     PHYSICAL EXAM:      BP (!) 153/63   Pulse 78   Temp 97.7 F (36.5 C) (Temporal)   Resp 16   Ht '5\' 4"'$  (1.626 m)   Wt 116 lb (52.6 kg)   SpO2 100%   BMI 19.91 kg/m  I        Heart:  RRR    Lungs:  CTA b    Abdomen:  S/NT/ND/+BS      ASSESSMENT AND PLAN:  ASA: per anesthesia  Mallampati: per anesthesia  1.  Patient is a 88 y.o. female here for EGD and EUS   2.  Procedure options, risks and benefits reviewed with the patient.  The patient expresses understanding.    Ihor Gully MD  Amber Palmer

## 2021-09-20 NOTE — Progress Notes (Signed)
Pt awake and alert at this time. Pt on RA, and VSS. Pt denies pain and nausea, tolerating PO. Pt meets criteria to be discharged from Phase 1.

## 2021-09-20 NOTE — Progress Notes (Signed)
Reviewed patient's medical and surgical history in electronic record and with patient at the bedside. All questions regarding procedure answered.   Scope verified using 2 person system.  Family in waiting room.  Electronically signed by Debroah Baller, RN on 09/20/2021 at 10:11 AM

## 2021-09-20 NOTE — Progress Notes (Signed)
Discharge instructions review with patient and son. All home medications have been reviewed, pt v/u. Discharge instructions signed. Pt discharged via wheelchair. Pt discharged with  all belongings. son taking stable pt home.

## 2021-09-20 NOTE — Progress Notes (Signed)
Teaching / education initiated regarding perioperative experience, expectations, and pain management during stay. Patient verbalized understanding.   Pt is alert and oriented. Pt and pt son confirmed that patient is a Full Code.   Dr Buena Irish at bedside and reviewed with pt and pt son today's procedure. Also, Dr Buena Irish instructed family to follow up with Dr Chrissie Noa concerning Gastrostomy tube removal. Comprehension noted.

## 2021-09-20 NOTE — Anesthesia Post-Procedure Evaluation (Signed)
Department of Anesthesiology  Postprocedure Note    Patient: Amber Palmer  MRN: [id number]  Birthdate: 06-08-1929  Date of evaluation: 09/20/2021      Procedure Summary     Date: 09/20/21 Room / Location: [facility] [facility]    Anesthesia Start: 0881 Anesthesia Stop: 1057    Procedures:       EGD ESOPHAGOGASTRODUODENOSCOPY ULTRASOUND      EGD POLYP SNARE (Abdomen) Diagnosis:       Pancreatic lesion      (Pancreatic lesion [K86.9])    Surgeons: York Spaniel, MD Responsible Provider: Judie Bonus, MD    Anesthesia Type: MAC ASA Status: 2          Anesthesia Type: MAC    Aldrete Phase I: Aldrete Score: 10    Aldrete Phase II:        Anesthesia Post Evaluation    Patient location during evaluation: PACU  Level of consciousness: awake  Complications: no  Multimodal analgesia pain management approach

## 2021-09-20 NOTE — Anesthesia Pre-Procedure Evaluation (Signed)
Department of Anesthesiology  Preprocedure Note       Name:  Amber Palmer   Age:  88 y.o.  DOB:  Feb 26, 1930                                          MRN:  [id number]         Date:  09/20/2021      Surgeon: Juliann Mule):  York Spaniel, MD    Procedure: Procedure(s):  EGD ESOPHAGOGASTRODUODENOSCOPY ULTRASOUND    Medications prior to admission:   Prior to Admission medications    Medication Sig Start Date End Date Taking? Authorizing Provider   lisinopril (PRINIVIL;ZESTRIL) 5 MG tablet Take 1 tablet by mouth daily 08/25/21   Loleta Dicker, MD   Nutritional Supplements (JEVITY 1.5 CAL/FIBER) LIQD Continuous feed at 45 ml/hr.   135 ml water flush q 4 hr.Increase flush if Na+ increases greater than 145 mEq/L. 08/24/21   Loleta Dicker, MD   Ascorbic Acid (VITAMIN C) 250 MG tablet Take 1 tablet by mouth daily    Historical Provider, MD   Calcium Ascorbate 500 MG TABS Take 500 mg by mouth 02/11/09   Historical Provider, MD   calcium carbonate (OSCAL) 500 MG TABS tablet Take 1 tablet by mouth daily    Historical Provider, MD       Current medications:    No current facility-administered medications for this encounter.       Allergies:    Allergies   Allergen Reactions   . Bee Venom        Problem List:    Patient Active Problem List   Diagnosis Code   . Hypertension I10   . Hyperlipidemia E78.5   . Vitamin D deficiency E55.9   . Pancreatic cancer ([place]) C25.9   . Failure to thrive in adult R62.7   . Severe malnutrition ([place]) E43   . Weight loss R63.4   . Elevated lipase R74.8       Past Medical History:        Diagnosis Date   . Hyperlipidemia    . Hypertension    . Pancreatic cancer ([place]) 08/18/2021   . Vitamin D deficiency        Past Surgical History:        Procedure Laterality Date   . GASTROSTOMY TUBE PLACEMENT N/A 08/22/2021    EGD PEG TUBE PLACEMENT performed by Waynard Reeds, MD at [place] History:    Social History     Tobacco Use   . Smoking status: Never   . Smokeless tobacco: Never   Substance  Use Topics   . Alcohol use: No                                Counseling given: Not Answered      Vital Signs (Current):   Vitals:    09/13/21 1134   Weight: 116 lb (52.6 kg)   Height: _0  (1.676 m)                                              BP Readings from Last 3 Encounters:   08/25/21 115/69  09/30/19 (!) 117/105   08/15/17 (!) 145/55       NPO Status:                                                                                 BMI:   Wt Readings from Last 3 Encounters:   09/13/21 116 lb (52.6 kg)   08/18/21 102 lb 9.6 oz (46.5 kg)   08/15/17 116 lb (52.6 kg)     Body mass index is 18.72 kg/m.    CBC:   Lab Results   Component Value Date/Time    WBC 5.9 08/25/2021 06:03 AM    RBC 3.17 08/25/2021 06:03 AM    HGB 10.4 08/25/2021 06:03 AM    HCT 31.3 08/25/2021 06:03 AM    MCV 98.9 08/25/2021 06:03 AM    RDW 12.9 08/25/2021 06:03 AM    PLT 125 08/25/2021 06:03 AM       CMP:   Lab Results   Component Value Date/Time    NA 143 08/25/2021 06:03 AM    K 4.0 08/25/2021 06:03 AM    CL 106 08/25/2021 06:03 AM    CO2 32 08/25/2021 06:03 AM    BUN 8 08/25/2021 06:03 AM    CREATININE 0.6 08/25/2021 06:03 AM    GFRAA >60 08/15/2017 01:42 PM    AGRATIO 1.0 08/24/2021 05:59 AM    LABGLOM >60 08/25/2021 06:03 AM    GLUCOSE 150 08/25/2021 06:03 AM    PROT 5.3 08/24/2021 05:59 AM    CALCIUM 8.7 08/25/2021 06:03 AM    BILITOT 0.5 08/24/2021 05:59 AM    ALKPHOS 44 08/24/2021 05:59 AM    AST 21 08/24/2021 05:59 AM    ALT 14 08/24/2021 05:59 AM       POC Tests: No results for input(s): POCGLU, POCNA, POCK, POCCL, POCBUN, POCHEMO, POCHCT in the last 72 hours.    Coags:   Lab Results   Component Value Date/Time    PROTIME 13.7 08/22/2021 05:54 AM    INR 1.05 08/22/2021 05:54 AM    APTT 23.7 06/20/2016 05:24 AM       HCG (If Applicable): No results found for: PREGTESTUR, PREGSERUM, HCG, HCGQUANT     ABGs: No results found for: PHART, PO2ART, PCO2ART, HCO3ART, BEART, O2SATART     Type & Screen (If Applicable):  No results found  for: LABABO, LABRH    Drug/Infectious Status (If Applicable):  No results found for: HIV, HEPCAB    COVID-19 Screening (If Applicable): No results found for: COVID19        Anesthesia Evaluation    Airway: Mallampati: II  TM distance: >3 FB   Neck ROM: full  Mouth opening: > = 3 FB   Dental:          Pulmonary:                              Cardiovascular:    (+) hypertension:,         Rhythm: regular  Rate: normal                    Neuro/Psych:   (+)  neuromuscular disease:,             GI/Hepatic/Renal:             Endo/Other:                     Abdominal:             Vascular:          Other Findings:           Anesthesia Plan      MAC     ASA 2       Induction: intravenous.      Anesthetic plan and risks discussed with patient.      Plan discussed with CRNA.                    Judie Bonus, MD   09/20/2021

## 2021-09-20 NOTE — Discharge Instructions (Addendum)
Impression:  -76m and 671mgastric nodules that appeared to be hyperplastic polyps removed with hot snare polypectomy.  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  -Collection of small cysts in the head of the pancreas with the largest 11 x 6.4mm.70mhis looks like a collection of small side branch IPMN's.  No intramural nodules or main duct involvement so given the small size, observation is best.    - 2.8 x 4.7mm p75mreatic body cyst.    Recommendations:  1. Clear liquid diet advance as tolerated.    2.  She is interested in having PEG removed. Call [phone] Amber Darterhis.  Has been 3 weeks since placed so will need to stay in a couple more weeks before tract mature enough to remove.  3.  Await biopsies.  Could consider repeat EGD in 6 months if the polyps are hyperplastic to survey for development of new polyps.   4. Consider MRI pancreas in 1 year to follow the cysts.    5.  [facility]    Call with any questions or concerns.    You may be drowsy or lightheaded after receiving sedation.  DO NOT operate  a vehicle (automobile, bicycle, motorcycle, machinery, or power tools), no  alcoholic beverages, and do not make any important decisions today.                 Plan on bed rest or quiet relaxation today.  Resume normal activities in the morning.     Resume normal activity tomorrow unless otherwise advised by your physician.            Eat a light first meal, avoiding spicy and fatty foods, then resume normal diet unless  you are told otherwise by your physician.    If the intravenous medication site is painful, apply warm compresses on the site until the soreness is relieved and elevate the arm above the heart. Call your physician if no improvement  in 2-3 days.       POSSIBLE SYMPTOMS TO WATCH:     1. fever (greater than 100) 5. increased abdominal bloating   2. severe  pain   6. excessive bleeding   3. nausea and vomiting  7. chest pain   4. chills    8. shortness of breath       Notify us if [place] problems occur     Expected as normal and remedies:  Sore throat: use over the counter throat lozenges or gargle with warm salt water.  Redness or soreness at the IV site: apply warm compress  Gaseous discomfort: belching or passing flatus (gas).                 GENERAL SURGERY DISCHARGE INSTRUCTIONS    Follow your surgeons instructions.  Follow up with your surgeon as directed.  Observe the operative area for signs of excessive bleeding.If needed apply pressure,elevate if able and contact your surgeon.  Observe the operative site for any signs of infection- such as increased pain,redness,fever greater than 101 degrees,swelling, foul odor or drainage.Contact your surgeon if any of these symptoms are present.  Keep operative site clean and dry.  Do not remove dressing unless instructed to by surgeon.  Apply ice as directed.  Avoid pulling,pushing or tugging to suture line.  If you become short  of breath call your doctor or go to the ER.  Take medications as directed.  Pain medication should be taken with food.  Do not drive or operate machinery while taking narcotics.  For any problems or question call your surgeon.     ANESTHESIA DISCHARGE INSTRUCTIONS    Wear your seatbelt home.  You are under the influence of drugs-do not drink alcohol,drive,operate machinery,or make any important decisions or sign any legal documentsfor 24 hours  A responsible adult needs to be with you for 24 hours.  You may experience lightheadedness,dizziness,or sleepiness following surgery.  Rest at home today- increase activity as tolerated.  Progress slowly to a regular diet unless your physician has instructed you otherwise.Drink plenty of water.  If nausea becomes a problem call your physician.  Coughing,sore throat,and muscle aches are other side effects of anesthesia,and should improve with time.  Do not  drive,operate machinery while taking narcotics.

## 2021-12-12 ENCOUNTER — Inpatient Hospital Stay: Admit: 2021-12-12 | Discharge: 2021-12-12 | Disposition: A | Discharge status: Home / Self Care | Payer: MEDICARE

## 2021-12-12 ENCOUNTER — Emergency Department: Admit: 2021-12-12 | Payer: MEDICARE | Primary: Nurse Practitioner

## 2021-12-12 DIAGNOSIS — Z431 Encounter for attention to gastrostomy: Secondary | ICD-10-CM

## 2021-12-12 DIAGNOSIS — K9421 Gastrostomy hemorrhage: Principal | ICD-10-CM

## 2021-12-12 NOTE — ED Notes (Signed)
Pt gtube redressed     Andreas Blower, RN  12/12/21 [phone]

## 2021-12-12 NOTE — ED Provider Notes (Signed)
[place]        Pt Name: Amber Palmer  MRN: [id number]  [place] 1929/06/14  Date of evaluation: 12/12/2021  Provider: Allegra Lai, DO  PCP: Valli Glance, APRN - NP  Note Started: 1:02 AM EDT 12/12/21    CHIEF COMPLAINT      G-Tube Problem    HISTORY OF PRESENT ILLNESS: 1 or more Elements     Chief Complaint   Patient presents with    G Tube Complications     Pt via family from home, g tube has bleeding to site and some drainage, it was placed two months ago, states no pain or fever     History from : Patient  Limitations to history : None    Amber Palmer is a 88 y.o. female who presents to the emergency department secondary to concern for possible leakage from G-tube site.  Patient has had this G-tube placed for the last 2 months.  She is noticing some leaking last couple days and now has some mild pain around the site.  Denies any abdominal pain.    Past medical history noted below. Aside from what is stated above denies any other symptoms or modifying factors.   reports that she has never smoked. She has never used smokeless tobacco. She reports that she does not drink alcohol and does not use drugs.  Nursing Notes were all reviewed and agreed with or any disagreements addressed in HPI/MDM.  REVIEW OF SYSTEMS :    Review of Systems   Constitutional:  Negative for chills and fever.   HENT:  Negative for congestion and rhinorrhea.    Eyes:  Negative for pain and redness.   Respiratory:  Negative for cough and shortness of breath.    Cardiovascular:  Negative for chest pain and leg swelling.   Gastrointestinal:  Negative for abdominal pain, constipation, diarrhea, nausea and vomiting.   Genitourinary:  Negative for frequency and urgency.   Musculoskeletal:  Negative for back pain and neck pain.   Skin:  Negative for rash.   Neurological:  Negative for facial asymmetry and weakness.   Psychiatric/Behavioral:  Negative for agitation and confusion.     All other systems reviewed and are negative.   Pertinent positive and negative findings as documented in the HPI  [place]    has a past medical history of Hyperlipidemia, Hypertension, Pancreatic cancer ([place]) (08/18/2021), Pancreatitis, and Vitamin D deficiency.   Past Medical History:   Diagnosis Date    Hyperlipidemia     Hypertension     Pancreatic cancer ([facility]) 08/18/2021    Pancreatitis     Vitamin D deficiency        SURGICALHISTORY       Past Surgical History:   Procedure Laterality Date    GASTROSTOMY TUBE PLACEMENT N/A 08/22/2021    EGD PEG TUBE PLACEMENT performed by Waynard Reeds, MD at [place] N/A 09/20/2021    EGD ESOPHAGOGASTRODUODENOSCOPY ULTRASOUND performed by York Spaniel, MD at [place] N/A 09/20/2021    EGD POLYP SNARE performed by York Spaniel, MD at [place] [facility]       Discharge Medication List as of 12/12/2021  3:32 AM        CONTINUE these medications which have NOT CHANGED    Details   VITAMIN D PO Take  1 tablet by mouth dailyHistorical Med      lisinopril (PRINIVIL;ZESTRIL) 5 MG tablet Take 1 tablet by mouth daily, Disp-30 tablet, R-0Normal      Nutritional Supplements (JEVITY 1.5 CAL/FIBER) LIQD Continuous feed at 45 ml/hr.    135 ml water flush q 4 hr.  Increase flush if Na+ increases greater than 145 mEq/L., Disp-1000 mL, R-5Print      Ascorbic Acid (VITAMIN C) 250 MG tablet Take 1 tablet by mouth dailyHistorical Med      Calcium Ascorbate 500 MG TABS Take 500 mg by mouthHistorical Med      calcium carbonate (OSCAL) 500 MG TABS tablet Take 1 tablet by mouth dailyHistorical Med            ALLERGIES     Bee venom  FAMILY HISTORY     No family history on file.  SOCIAL HISTORY       Social History     Socioeconomic History    Marital status: Divorced   Tobacco Use    Smoking status: Never    Smokeless tobacco: Never   Substance and Sexual Activity     Alcohol use: No    Drug use: Never     SCREENINGS                           CIWA Assessment  BP: (!) 158/83  Pulse: 88         PHYSICAL EXAM  1 or more Elements   INITIAL VITALS: BP: (!) 158/83, Temp: 97.9 F (36.6 C), Pulse: 88, Respirations: 23, SpO2: 100 %     Wt Readings from Last 3 Encounters:   12/12/21 110 lb (49.9 kg)   09/20/21 116 lb (52.6 kg)   08/18/21 102 lb 9.6 oz (46.5 kg)     Physical Exam  Constitutional:       General: She is not in acute distress.     Appearance: Normal appearance. She is not ill-appearing.   HENT:      Head: Normocephalic and atraumatic.      Right Ear: Tympanic membrane and external ear normal.      Left Ear: Tympanic membrane and external ear normal.      Nose: Nose normal. No congestion or rhinorrhea.      Mouth/Throat:      Mouth: Mucous membranes are moist.      Pharynx: No oropharyngeal exudate or posterior oropharyngeal erythema.   Eyes:      General:         Right eye: No discharge.         Left eye: No discharge.      Extraocular Movements: Extraocular movements intact.      Pupils: Pupils are equal, round, and reactive to light.   Cardiovascular:      Rate and Rhythm: Normal rate and regular rhythm.      Pulses: Normal pulses.      Heart sounds: Normal heart sounds. No murmur heard.  Pulmonary:      Effort: Pulmonary effort is normal. No respiratory distress.      Breath sounds: Normal breath sounds. No wheezing.   Abdominal:      General: Abdomen is flat. There is no distension.      Palpations: Abdomen is soft.      Tenderness: There is no abdominal tenderness.   Musculoskeletal:         General: No swelling or tenderness. Normal range of  motion.      Cervical back: Normal range of motion. No rigidity or tenderness.   Skin:     General: Skin is warm and dry.      Capillary Refill: Capillary refill takes less than 2 seconds.      Coloration: Skin is not jaundiced.      Findings: No erythema.      Comments: Mild skin irritation around site of G-tube.  No significant  leakage noted.   Neurological:      General: No focal deficit present.      Mental Status: She is alert and oriented to person, place, and time.      Cranial Nerves: No cranial nerve deficit.   Psychiatric:         Mood and Affect: Mood normal.         Behavior: Behavior normal.         DIAGNOSTIC RESULTS   EKG: N/A    RADIOLOGY:   Non-plain film images such as CT, Ultrasound and MRI are read by the radiologist.   Plain radiographic images are visualized and preliminarily interpreted by the ED Provider with the below findings:  -Correct placement of G-tube  Interpretation per Radiologist below, if available at the time of this note:  XR INJ CONTRAST GASTRIC TUBE PERC   Final Result   No contrast leak from the stomach following injection of the gastrostomy tube.           Last 7 days No results found.    POCUS No results found.  ED BEDSIDE ULTRASOUND:   N/A    LABS:  Labs Reviewed - No data to display  When ordered only abnormal lab results are displayed. All other labs were within normal range or not returned as of this dictation.  PROCEDURES   Unless otherwise noted below, none  Procedures  CRITICAL CARE TIME (.cct)   Due to the immediate potential for life-threatening deterioration due to n/a, I spent 0 minutes providing critical care. There was a high probability of clinically significant/life threatening deterioration in the patient's condition which required my urgent intervention. This time excludes time spent performing procedures but includes time spent on direct patient care, history retrieval, review of the chart, and discussions with patient, family, and consultant(s).  EMERGENCY DEPARTMENT COURSE and DIFFERENTIAL DIAGNOSIS/MDM:   CONSULTS: (Who and what was discussed)  None  Discussion with Other Profesionals : None  Social Determinants Significantly Affecting Health : None  Chronic Conditions: see medical history  Records Reviewed : None    Patient was given the following medications:  No orders of the  defined types were placed in this encounter.    INITIAL VITALS: BP: (!) 158/83, Temp: 97.9 F (36.6 C), Pulse: 88, Respirations: 23, SpO2: 100 %   RECENT VITALS:  BP: (!) 158/83,Temp: 97.9 F (36.6 C), Pulse: 88, Respirations: 23, SpO2: 100 %     Is this patient to be included in the SEP-1 Core Measure due to severe sepsis or septic shock?   No   Exclusion criteria - the patient is NOT to be included for SEP-1 Core Measure due to:  2+ SIRS criteria are not met  CC/HPI Summary, DDx, ED Course, and Reassessment:   Amber Palmer is a 88 y.o. female who presents to the emergency department secondary to concern for symptoms as noted in HPI above.     On presentation she is in no acute distress.  No obvious leaking was noted from inspection of G-tube.  Mild skin irritation noted at G-tube insertion site.  G-tube incision site was redressed with bacitracin and zinc oxide ointment.  X-ray of G-tube showed no evidence of contrast leak.    A peripheral IV was placed, labs were ordered.    After discussion of results, diagnosis, and symptomatic care, I reiterated return precautions and importance of follow-up.   She Patient expressed understanding of all instruction. The patient was in agreement with plan, and all questions were answered. She was discharged home in stable condition.          Disposition Considerations (tests considered but not done, Shared Decision Making, Pt Expectation of Test or Tx.): Appropriate for outpatient management    I estimate there is low risk for sepsis, MI, stroke, tamponade, PTX, toxicity, or other life threatening etiology. Given the best available information and clinical assessment I estimate the risk of hospitalization to be greater than risk of treatment at home. We discussed and I explained the risk could rapidly change and return precautions and instructions given. Discharge disposition is reasonable.     I am the Primary Clinician of Record.  FINAL IMPRESSION      1. Attention to G-tube  [facility])          DISPOSITION/PLAN   DISPOSITION Decision To Discharge 12/12/2021 03:27:17 AM      PATIENT REFERRED TO:  Valli Glance, APRN - NP  [place] [address]  [phone]    Schedule an appointment as soon as possible for a visit       [facility] Emergency Department  [address]  [place] [address]  [phone]  Go to   As needed, If symptoms worsen      DISCHARGE MEDICATIONS:  Discharge Medication List as of 12/12/2021  3:32 AM             (Please note that portions of this note were completed with a voice recognition program.  Efforts were made to edit the dictations but occasionally words are mis-transcribed.)    Allegra Lai, DO (electronically signed)  Attending Emergency Physician       Allegra Lai, DO  12/12/21 [phone]

## 2022-01-29 ENCOUNTER — Inpatient Hospital Stay
Admit: 2022-01-29 | Discharge: 2022-01-30 | Disposition: A | Discharge status: Home / Self Care | Payer: MEDICARE | Attending: Emergency Medicine

## 2022-01-29 ENCOUNTER — Emergency Department: Admit: 2022-01-29 | Payer: MEDICARE | Primary: Nurse Practitioner

## 2022-01-29 DIAGNOSIS — R110 Nausea: Secondary | ICD-10-CM

## 2022-01-29 DIAGNOSIS — N3000 Acute cystitis without hematuria: Secondary | ICD-10-CM

## 2022-01-29 LAB — CBC WITH AUTO DIFFERENTIAL
Basophils %: 0.1 %
Basophils Absolute: 0 10*3/uL (ref 0.0–0.2)
Eosinophils %: 1.3 %
Eosinophils Absolute: 0.1 10*3/uL (ref 0.0–0.6)
Hematocrit: 41.1 % (ref 36.0–48.0)
Hemoglobin: 13.7 g/dL (ref 12.0–16.0)
Lymphocytes %: 13.4 %
Lymphocytes Absolute: 1 10*3/uL (ref 1.0–5.1)
MCH: 33.2 pg (ref 26.0–34.0)
MCHC: 33.2 g/dL (ref 31.0–36.0)
MCV: 99.9 fL (ref 80.0–100.0)
MPV: 8.8 fL (ref 5.0–10.5)
Monocytes %: 5.7 %
Monocytes Absolute: 0.4 10*3/uL (ref 0.0–1.3)
Neutrophils %: 79.5 %
Neutrophils Absolute: 6 10*3/uL (ref 1.7–7.7)
Platelets: 143 10*3/uL (ref 135–450)
RBC: 4.12 M/uL (ref 4.00–5.20)
RDW: 12.9 % (ref 12.4–15.4)
WBC: 7.5 10*3/uL (ref 4.0–11.0)

## 2022-01-29 LAB — COMPREHENSIVE METABOLIC PANEL W/ REFLEX TO MG FOR LOW K
ALT: 20 U/L (ref 10–40)
AST: 23 U/L (ref 15–37)
Albumin/Globulin Ratio: 1.2 (ref 1.1–2.2)
Albumin: 4.3 g/dL (ref 3.4–5.0)
Alkaline Phosphatase: 78 U/L (ref 40–129)
Anion Gap: 15 (ref 3–16)
BUN: 17 mg/dL (ref 7–20)
CO2: 25 mmol/L (ref 21–32)
Calcium: 9.8 mg/dL (ref 8.3–10.6)
Chloride: 102 mmol/L (ref 99–110)
Creatinine: 0.8 mg/dL (ref 0.6–1.2)
Est, Glom Filt Rate: >60 (ref >60)
Glucose: 176 mg/dL — ABNORMAL HIGH (ref 70–99)
Potassium reflex Magnesium: 3.7 mmol/L (ref 3.5–5.1)
Sodium: 142 mmol/L (ref 136–145)
Total Bilirubin: 0.4 mg/dL (ref 0.0–1.0)
Total Protein: 7.9 g/dL (ref 6.4–8.2)

## 2022-01-29 LAB — PROTIME-INR
INR: 0.91 (ref 0.84–1.16)
Protime: 12.3 s (ref 11.5–14.8)

## 2022-01-29 LAB — LACTATE, SEPSIS: Lactic Acid, Sepsis: 1.9 mmol/L (ref 0.4–1.9)

## 2022-01-29 LAB — LIPASE: Lipase: 90 U/L — ABNORMAL HIGH (ref 13.0–60.0)

## 2022-01-29 MED ORDER — ONDANSETRON HCL 4 MG/2ML IJ SOLN
4 MG/2ML | INTRAMUSCULAR | Status: DC | PRN
Start: 2022-01-29 — End: 2022-01-30

## 2022-01-29 MED ORDER — SODIUM CHLORIDE 0.9% BOLUS (FLUID RESUSCITATION)
0.9 % | Freq: Once | INTRAVENOUS | Status: AC
Start: 2022-01-29 — End: 2022-01-29
  Administered 2022-01-30: 01:00:00 2000 mL via INTRAVENOUS

## 2022-01-29 MED ORDER — IOHEXOL 350 MG/ML IV SOLN
350 MG/ML | INTRAVENOUS | Status: AC
Start: 2022-01-29 — End: 2022-01-29
  Administered 2022-01-29: 21:00:00 30 via GASTROSTOMY

## 2022-01-29 MED FILL — OMNIPAQUE 350 MG/ML IV SOLN: 350 mg/mL | INTRAVENOUS | Qty: 50

## 2022-01-29 NOTE — ED Provider Notes (Signed)
[facility]    I, Waldo Laine, MD, am the primary clinician of record.    CHIEF COMPLAINT  Chief Complaint   Patient presents with    Nausea     Pt to ED with CC of nausea x1 week.  Pt has been taking tube feeds and has stopped taking them a week ago.  States that since she stopped, she has been nauseated.        HISTORY OF PRESENT ILLNESS  Amber Palmer is a 88 y.o. female  who presents to the ED complaining of general nausea for a week or so.  Of note in June she had a PEG tube placed due to history of pancreatic cancer.  She has been on tube feeds but apparently the plan is to potentially take this out in February for her.  The patient was told to stop tube feeds about a week ago and ever since then she has had notable nausea with generalized malaise and fatigue and generalized weakness.  She just restarted tube feeds today and came to the hospital.  She has not actually vomited.  Check she does not have abdominal pains in general and has no issues with the G-tube itself, it seems to be functioning fine for the patient and no leakage or redness around it.  No fevers.  She has not passed out.  She denies chest pain or shortness of breath.  She just mainly c/o nausea and continues to feel generally weak.    No other complaints, modifying factors or associated symptoms.     I have reviewed the following from the nursing documentation.    Past Medical History:   Diagnosis Date    Hyperlipidemia     Hypertension     Pancreatic cancer ([place]) 08/18/2021    Pancreatitis     Vitamin D deficiency      Past Surgical History:   Procedure Laterality Date    GASTROSTOMY TUBE PLACEMENT N/A 08/22/2021    EGD PEG TUBE PLACEMENT performed by Waynard Reeds, MD at [place] N/A 09/20/2021    EGD ESOPHAGOGASTRODUODENOSCOPY ULTRASOUND performed by York Spaniel, MD at [place] N/A 09/20/2021    EGD POLYP  SNARE performed by York Spaniel, MD at [place]     History reviewed. No pertinent family history.  Social History     Socioeconomic History    Marital status: Divorced     Spouse name: Not on file    Number of children: Not on file    Years of education: Not on file    Highest education level: Not on file   Occupational History    Not on file   Tobacco Use    Smoking status: Never    Smokeless tobacco: Never   Substance and Sexual Activity    Alcohol use: No    Drug use: Never    Sexual activity: Not on file   Other Topics Concern    Not on file   Social History Narrative    Not on file     Social Determinants of Health     Financial Resource Strain: Not on file   Food Insecurity: Not on file   Transportation Needs: Not on file   Physical Activity: Not on file   Stress: Not on file   Social Connections: Not on file   Intimate Partner Violence: Not on  file   Housing Stability: Not on file     Current Facility-Administered Medications   Medication Dose Route Frequency Provider Last Rate Last Admin    ondansetron (ZOFRAN) injection 4 mg  4 mg IntraVENous Q1H PRN Waldo Laine, MD         Current Outpatient Medications   Medication Sig Dispense Refill    ondansetron (ZOFRAN-ODT) 4 MG disintegrating tablet Take 1 tablet by mouth 3 times daily as needed for Nausea or Vomiting 21 tablet 0    cefUROXime (CEFTIN) 250 MG tablet Take 1 tablet by mouth 2 times daily for 10 days 20 tablet 0    VITAMIN D PO Take 1 tablet by mouth daily      lisinopril (PRINIVIL;ZESTRIL) 5 MG tablet Take 1 tablet by mouth daily (Patient not taking: Reported on 12/12/2021) 30 tablet 0    Nutritional Supplements (JEVITY 1.5 CAL/FIBER) LIQD Continuous feed at 45 ml/hr.    135 ml water flush q 4 hr.  Increase flush if Na+ increases greater than 145 mEq/L. 1000 mL 5    Ascorbic Acid (VITAMIN C) 250 MG tablet Take 1 tablet by mouth daily      Calcium Ascorbate 500 MG TABS Take 500 mg by mouth      calcium carbonate (OSCAL) 500 MG  TABS tablet Take 1 tablet by mouth daily       Allergies   Allergen Reactions    Bee Venom        REVIEW OF SYSTEMS  10 systems reviewed, pertinent positives per HPI otherwise noted to be negative.    PHYSICAL EXAM  BP (!) 172/73   Pulse 90   Temp 97.8 F (36.6 C) (Oral)   Resp 16   Ht 1.626 m ('5\' 4"'$ )   Wt 49.9 kg (110 lb)   SpO2 99%   BMI 18.88 kg/m    GENERAL APPEARANCE: Awake and alert. Cooperative. No distress.  Chronically ill appearing.  HENT: Normocephalic. Atraumatic. Mucous membranes are dry.  NECK: Supple.    EYES: PERRL. EOM's grossly intact.  HEART/CHEST: RRR. No murmurs.  No chest wall tenderness.  LUNGS: Respirations unlabored. CTAB. Good air exchange. Speaking comfortably in full sentences.   ABDOMEN: PEG tube noted, no inflammation around entry site.  No abdominal tenderness. Soft. Non-distended. No masses. No organomegaly. No guarding or rebound. Normal bowel sounds throughout.  MUSCULOSKELETAL: No extremity edema. Compartments soft.  No deformity.  No tenderness in the extremities.  All extremities neurovascularly intact.  SKIN: Warm and dry. No acute rashes.   NEUROLOGICAL: Alert and oriented. CN's 2-12 intact. No gross facial drooping. Strength 5/5, sensation intact. 2 plus DTR's in knees bilaterally.  Gait normal.  PSYCHIATRIC: Normal mood and affect.    LABS  I have personally reviewed all labs for this visit.   Results for orders placed or performed during the hospital encounter of 01/29/22   CBC with Auto Differential   Result Value Ref Range    WBC 7.5 4.0 - 11.0 K/uL    RBC 4.12 4.00 - 5.20 M/uL    Hemoglobin 13.7 12.0 - 16.0 g/dL    Hematocrit 41.1 36.0 - 48.0 %    MCV 99.9 80.0 - 100.0 fL    MCH 33.2 26.0 - 34.0 pg    MCHC 33.2 31.0 - 36.0 g/dL    RDW 12.9 12.4 - 15.4 %    Platelets 143 135 - 450 K/uL    MPV 8.8 5.0 - 10.5 fL  Neutrophils % 79.5 %    Lymphocytes % 13.4 %    Monocytes % 5.7 %    Eosinophils % 1.3 %    Basophils % 0.1 %    Neutrophils Absolute 6.0 1.7 - 7.7 K/uL     Lymphocytes Absolute 1.0 1.0 - 5.1 K/uL    Monocytes Absolute 0.4 0.0 - 1.3 K/uL    Eosinophils Absolute 0.1 0.0 - 0.6 K/uL    Basophils Absolute 0.0 0.0 - 0.2 K/uL   Comprehensive Metabolic Panel w/ Reflex to MG   Result Value Ref Range    Sodium 142 136 - 145 mmol/L    Potassium reflex Magnesium 3.7 3.5 - 5.1 mmol/L    Chloride 102 99 - 110 mmol/L    CO2 25 21 - 32 mmol/L    Anion Gap 15 3 - 16    Glucose 176 (H) 70 - 99 mg/dL    BUN 17 7 - 20 mg/dL    Creatinine 0.8 0.6 - 1.2 mg/dL    Est, Glom Filt Rate >60 >60    Calcium 9.8 8.3 - 10.6 mg/dL    Total Protein 7.9 6.4 - 8.2 g/dL    Albumin 4.3 3.4 - 5.0 g/dL    Albumin/Globulin Ratio 1.2 1.1 - 2.2    Total Bilirubin 0.4 0.0 - 1.0 mg/dL    Alkaline Phosphatase 78 40 - 129 U/L    ALT 20 10 - 40 U/L    AST 23 15 - 37 U/L   Lipase   Result Value Ref Range    Lipase 90.0 (H) 13.0 - 60.0 U/L   Lactate, Sepsis   Result Value Ref Range    Lactic Acid, Sepsis 1.9 0.4 - 1.9 mmol/L   Urinalysis with Reflex to Culture    Specimen: Urine, clean catch   Result Value Ref Range    Color, UA Yellow Straw/Yellow    Clarity, UA Clear Clear    Glucose, Ur Negative Negative mg/dL    Bilirubin Urine Negative Negative    Ketones, Urine Negative Negative mg/dL    Specific Gravity, UA 1.025 1.005 - 1.030    Blood, Urine Negative Negative    pH, UA 7.0 5.0 - 8.0    Protein, UA 100 (A) Negative mg/dL    Urobilinogen, Urine 0.2 <2.0 E.U./dL    Nitrite, Urine Negative Negative    Leukocyte Esterase, Urine TRACE (A) Negative    Microscopic Examination YES     Urine Type NotGiven     Urine Reflex to Culture Yes    Protime-INR   Result Value Ref Range    Protime 12.3 11.5 - 14.8 sec    INR 0.91 0.84 - 1.16   Microscopic Urinalysis   Result Value Ref Range    Bacteria, UA 1+ (A) None Seen /HPF    Hyaline Casts, UA 0 0 - 8 /LPF    WBC, UA 18 (H) 0 - 5 /HPF    RBC, UA 6 (H) 0 - 4 /HPF    Epithelial Cells, UA 1 0 - 5 /HPF   EKG 12 Lead   Result Value Ref Range    Ventricular Rate 92 BPM     Atrial Rate 92 BPM    P-R Interval 140 ms    QRS Duration 64 ms    Q-T Interval 344 ms    QTc Calculation (Bazett) 425 ms    P Axis 84 degrees    R Axis 25 degrees    T Axis  44 degrees    Diagnosis       Normal sinus rhythmLow voltage QRSSeptal infarct , age undeterminedAbnormal ECG       EKG performed in ED:  The 12 lead EKG was interpreted by me independent of a cardiologist as follows:  Rate: normal with a rate of 92  Rhythm: sinus  Axis: normal  Intervals: normal PR, narrow QRS, normal QTc  ST segments: no ST elevations or depressions  T waves: no abnormal inversions  Non-specific T wave changes: not present  Prior EKG comparison: EKG dated 08/18/21 is not significantly different    RADIOLOGY  Any applicable radiology studies including x-ray, CT, MRI, and/or ultrasound, were reviewed independently by me in addition to the radiologist.  I reviewed all radiology images and reports as well from this evaluation.    CT ABDOMEN PELVIS WO CONTRAST Additional Contrast? None    Result Date: 01/29/2022  Status post gastrostomy tube placement along the body of the stomach with oral contrast throughout the stomach and bowel which is more apparent with no contrast extravasation and no bowel obstruction. Questionable cholelithiasis which is more apparent Mild pattern megaly which is unchanged Questionable small hypodense lesion along the head of the pancreas measuring 1.2 cm which is not well visualized due to the lack of IV contrast.  There is was described previously as a questionable mass.  Recommend follow-up and correlating with previous MRI findings. Bilateral renal cysts with the largest on the left measuring 6.5 cm which is unchanged. 7 mm stone lower pole left kidney which is unchanged with no hydronephrosis or urinary obstruction 14 mm  abdominal wall hernia which is more prominent with no bowel loops in hernia Atrophic uterus with no pelvic mass or active inflammation. Mild fecal impaction in the rectum with no  obstruction.     XR INJ CONTRAST GASTRIC TUBE PERC    Result Date: 01/29/2022  Intraluminal positioning of left upper quadrant gastrostomy tube.        ED COURSE/MDM  Patient seen and evaluated. Old records reviewed. Labs and imaging reviewed.    After initial evaluation, differential diagnostic considerations included but not limited to: kidney stone, pyelonephritis, UTI, appendicitis, bowel obstruction, diverticulitis, hernia, gastritis/gastroenteritis, pancreatitis, cholecystitis, hepatitis, constipation, IBS, IBD      The patient's ED workup was notable for ongoing nausea generalized weakness over the past week or so since she is try to stop tube feeds.  She appears chronically ill but not acutely unwell.  She is hypertensive but has no significant tachycardia, no fever here and no fevers reported at home and no infectious symptoms are noted at this time.  No chest pain or shortness of breath.  She actually has no abdominal pains despite her other symptoms that could be potentially GI related.    Based on her age with history of cancer, I did obtain a CT abdomen pelvis showing no acute findings, with previously known pancreatic lesion, well seated gastrostomy tube, gallstones without cholecystitis and no pain to suggest acute cholecystitis, and other incidental nonemergent findings since she has no belly pain.  Has not vomited here.  A contrasted study also shows good positioning of her feeding tube.    Labs were obtained showing no AKI or metabolic derangement or other acute findings.  Patient was given IV Zofran and IV fluids.  On reassessment, feeling fine, no vomiting here.  UA with UTI.    Engaged in [place] with family extensively.  Family and pt prefer discharge home and  I think this is reasonable given benign overall workup and close outpt monitoring and f/u with GI advised.  Rx zofran and ceftin for home.  Not septic or toxic appearing.  Pt seems to have done better with tube feeds so the trial of holding  them did not go well in the opinion of the pt and her two sons.  As such I told them to d/w Dr. Scheryl Darter from GI team regarding this and to continue TF in the meantime.    Is this patient to be included in the SEP-1 Core Measure?  No   Exclusion criteria - the patient is NOT to be included for SEP-1 Core Measure due to:  Infection is not suspected      Consults:  None    History obtained from: Patient and Family (sons x2)    Pertinent social determinants of health: None applicable    Chronic conditions potentially affecting care:  pancreatic CA    Review of other records:  Inpatient notes from previous visit at this facility from 09/20/21 admission including Dr. Buena Irish (GI) op note for EGD    Reassessment:  See MDM for details of medications given and reassessment    During the patient's ED course, the patient was given:  Medications   ondansetron (ZOFRAN) injection 4 mg (has no administration in time range)   0.9 % sodium chloride IV bolus 2,000 mL (2,000 mLs IntraVENous New Bag 01/29/22 2006)   iohexol (OMNIPAQUE 350) 350 MG/ML solution (30 mLs Gastric Tube Given 01/29/22 1550)   cefUROXime (CEFTIN) tablet 250 mg (250 mg Oral Given 01/29/22 2233)   ondansetron (ZOFRAN-ODT) disintegrating tablet 4 mg (4 mg Oral Given 01/29/22 2234)        CLINICAL IMPRESSION  1. Nausea    2. Acute cystitis without hematuria        Blood pressure (!) 172/73, pulse 90, temperature 97.8 F (36.6 C), temperature source Oral, resp. rate 16, height 1.626 m ('5\' 4"'$ ), weight 49.9 kg (110 lb), SpO2 99 %.    DISPOSITION  Amber Palmer was discharged in stable condition.    I have discussed the findings of today's workup with the patient and addressed the patient's questions and concerns.  Important warning signs as well as new or worsening symptoms which would necessitate immediate return to the ED were discussed.  The plan is to discharge from the ED at this time, and the patient is in stable condition.  The patient acknowledged understanding is  agreeable with this plan.    Patient was given scripts for the following medications. I counseled patient how to take these medications.   New Prescriptions    CEFUROXIME (CEFTIN) 250 MG TABLET    Take 1 tablet by mouth 2 times daily for 10 days    ONDANSETRON (ZOFRAN-ODT) 4 MG DISINTEGRATING TABLET    Take 1 tablet by mouth 3 times daily as needed for Nausea or Vomiting       Follow-up with:  Waynard Reeds, MD  [place] [address]  [phone]    Call in 1 day  For symptom re-evaluation    [facility]  [address]  [place] [address]  [phone]  Go to   For symptom re-evaluation, If symptoms worsen      Critical care time:  None    DISCLAIMER: This chart was created using Dragon dictation software.  Efforts were made by me to ensure accuracy, however some  errors may be present due to limitations of this technology and occasionally words are not transcribed correctly.        Waldo Laine, MD  01/29/22 2240

## 2022-01-29 NOTE — ED Notes (Signed)
Enema successful.     Oretha Milch, RN  01/29/22 2026

## 2022-01-30 LAB — EKG 12-LEAD
Atrial Rate: 92 {beats}/min
P Axis: 84 degrees
P-R Interval: 140 ms
Q-T Interval: 344 ms
QRS Duration: 64 ms
QTc Calculation (Bazett): 425 ms
R Axis: 25 degrees
T Axis: 44 degrees
Ventricular Rate: 92 {beats}/min

## 2022-01-30 LAB — URINALYSIS WITH REFLEX TO CULTURE
Bilirubin Urine: NEGATIVE
Blood, Urine: NEGATIVE
Glucose, Ur: NEGATIVE mg/dL
Ketones, Urine: NEGATIVE mg/dL
Nitrite, Urine: NEGATIVE
Protein, UA: 100 mg/dL — AB
Specific Gravity, UA: 1.025 (ref 1.005–1.030)
Urobilinogen, Urine: 0.2 E.U./dL (ref <2.0)
pH, UA: 7 (ref 5.0–8.0)

## 2022-01-30 LAB — MICROSCOPIC URINALYSIS
Epithelial Cells, UA: 1 /HPF (ref 0–5)
Hyaline Casts, UA: 0 /LPF (ref 0–8)
RBC, UA: 6 /HPF — ABNORMAL HIGH (ref 0–4)
WBC, UA: 18 /HPF — ABNORMAL HIGH (ref 0–5)

## 2022-01-30 MED ORDER — CEFUROXIME AXETIL 250 MG PO TABS
250 | ORAL_TABLET | Freq: Two times a day (BID) | ORAL | 0 refills | Status: AC
Start: 2022-01-30 — End: 2022-02-08

## 2022-01-30 MED ORDER — ONDANSETRON 4 MG PO TBDP
4 MG | Freq: Once | ORAL | Status: AC
Start: 2022-01-30 — End: 2022-01-29
  Administered 2022-01-30: 04:00:00 4 mg via ORAL

## 2022-01-30 MED ORDER — CEFUROXIME AXETIL 250 MG PO TABS
250 MG | Freq: Once | ORAL | Status: AC
Start: 2022-01-30 — End: 2022-01-29
  Administered 2022-01-30: 04:00:00 250 mg via ORAL

## 2022-01-30 MED ORDER — ONDANSETRON 4 MG PO TBDP
4 | ORAL_TABLET | Freq: Three times a day (TID) | ORAL | 0 refills | Status: DC | PRN
Start: 2022-01-30 — End: 2022-09-14

## 2022-01-30 MED FILL — ONDANSETRON HCL 4 MG/2ML IJ SOLN: 4 MG/2ML | INTRAMUSCULAR | Qty: 2

## 2022-01-30 MED FILL — ONDANSETRON 4 MG PO TBDP: 4 mg | ORAL | Qty: 1

## 2022-01-30 MED FILL — CEFUROXIME AXETIL 250 MG PO TABS: 250 mg | ORAL | Qty: 1

## 2022-01-31 LAB — CULTURE, URINE: Urine Culture, Routine: <50000

## 2022-02-13 ENCOUNTER — Ambulatory Visit
Admit: 2022-02-13 | Discharge: 2022-02-13 | Payer: MEDICARE | Attending: Student in an Organized Health Care Education/Training Program | Primary: Student in an Organized Health Care Education/Training Program

## 2022-02-13 DIAGNOSIS — I10 Essential (primary) hypertension: Secondary | ICD-10-CM

## 2022-02-13 MED ORDER — TOLTERODINE TARTRATE ER 4 MG PO CP24
4 | ORAL_CAPSULE | Freq: Every day | ORAL | 1 refills | Status: DC
Start: 2022-02-13 — End: 2022-06-06

## 2022-02-13 MED ORDER — ATORVASTATIN CALCIUM 20 MG PO TABS
20 | ORAL_TABLET | Freq: Every day | ORAL | 1 refills | Status: DC
Start: 2022-02-13 — End: 2022-06-06

## 2022-02-13 MED ORDER — DONEPEZIL HCL 10 MG PO TABS
10 | ORAL_TABLET | Freq: Every evening | ORAL | 1 refills | Status: DC
Start: 2022-02-13 — End: 2022-06-06

## 2022-02-13 MED ORDER — AMLODIPINE BESYLATE 10 MG PO TABS
10 | ORAL_TABLET | Freq: Every day | ORAL | 1 refills | Status: DC
Start: 2022-02-13 — End: 2022-06-06

## 2022-02-13 NOTE — Progress Notes (Signed)
Amber Palmer (DOB:  09-Jun-1929) is a 88 y.o. female,Established patient, here for evaluation of the following chief complaint(s):  Established New Doctor      Assessment/Plan:     1. Primary hypertension  - BP controlled  - Continue amlodipine  - BP Goal <140/90, Patient to bring BP log to next visit, Mediterranean lifestyle discussed and encouraged; handout given, and DASH diet encouraged  - amLODIPine (NORVASC) 10 MG tablet; Take 1 tablet by mouth daily  Dispense: 90 tablet; Refill: 1    2. Dementia without behavioral disturbance, psychotic disturbance, mood disturbance, or anxiety, unspecified dementia severity, unspecified dementia type (HCC)  - Refill  - donepezil (ARICEPT) 10 MG tablet; Take 1 tablet by mouth nightly  Dispense: 90 tablet; Refill: 1    3. Mixed hyperlipidemia  - Refill  - atorvastatin (LIPITOR) 20 MG tablet; Take 1 tablet by mouth daily  Dispense: 90 tablet; Refill: 1    4. Urge incontinence of urine  - Refill  - tolterodine (DETROL LA) 4 MG extended release capsule; Take 1 capsule by mouth daily  Dispense: 90 capsule; Refill: 1    5. Imbalance  - Continue with OT  - Tai Chi  - Ambulation device        Return in about 2 months (around 04/15/2022) for AWV .         PHQ-9 Total Score: 0 (02/13/2022 12:25 PM)         Subjective   SUBJECTIVE/OBJECTIVE:  HPI:   Establishing care; no concerns  Would like to establish     Recently had feeding tube placed for decreased PO. No issues with tube   Eating and drinking boost for women  No hx of cancer   Following with GI; Dr. Scheryl Darter. Next appt 02/28/22 for tube     Ambulates without any devices   Retired; worked as Psychologist, counselling     Does not do much at home  OT comes twice a week     Food prepared by sons    Hx of htn  Managed with amlodipine  Does not take bp at home; done by nursing   120s/70s  Denies lightheadedness, dizziness      Patient Active Problem List   Diagnosis    Hypertension    Hyperlipidemia    Vitamin D deficiency    Failure to thrive in  adult    Severe malnutrition ([place])    Weight loss    Elevated lipase    Dementia ([place])     Past Medical History:   Diagnosis Date    Hyperlipidemia     Hypertension     Pancreatic cancer ([place]) 08/18/2021    Pancreatitis     Vitamin D deficiency      Prior to Admission medications    Medication Sig Start Date End Date Taking? Authorizing Provider   amLODIPine (NORVASC) 10 MG tablet Take 1 tablet by mouth daily 02/13/22 08/12/22 Yes Princess Perna, DO   atorvastatin (LIPITOR) 20 MG tablet Take 1 tablet by mouth daily 02/13/22 08/12/22 Yes Princess Perna, DO   donepezil (ARICEPT) 10 MG tablet Take 1 tablet by mouth nightly 02/13/22 08/12/22 Yes Princess Perna, DO   tolterodine (DETROL LA) 4 MG extended release capsule Take 1 capsule by mouth daily 02/13/22 08/12/22 Yes Princess Perna, DO   ondansetron (ZOFRAN-ODT) 4 MG disintegrating tablet Take 1 tablet by mouth 3 times daily as needed for Nausea or Vomiting 01/29/22  Yes Waldo Laine, MD   VITAMIN  D PO Take 1 tablet by mouth daily   Yes [provider]   Ascorbic Acid (VITAMIN C) 250 MG tablet Take 1 tablet by mouth daily   Yes [provider]   Calcium Ascorbate 500 MG TABS Take 1 tablet by mouth 02/11/09  Yes [provider]   calcium carbonate (OSCAL) 500 MG TABS tablet Take 1 tablet by mouth daily   Yes [provider]     Social History     Socioeconomic History    Marital status: Divorced     Spouse name: Not on file    Number of children: Not on file    Years of education: Not on file    Highest education level: Not on file   Occupational History    Not on file   Tobacco Use    Smoking status: Never    Smokeless tobacco: Never   Substance and Sexual Activity    Alcohol use: No    Drug use: Never    Sexual activity: Not on file   Other Topics Concern    Not on file   Social History Narrative    Not on file     Social Determinants of Health     Financial Resource Strain: Low Risk  (02/13/2022)    Overall Financial Resource  Strain (CARDIA)     Difficulty of Paying Living Expenses: Not hard at all   Food Insecurity: No Food Insecurity (02/13/2022)    Hunger Vital Sign     Worried About Running Out of Food in the Last Year: Never true     [place] in the Last Year: Never true   Transportation Needs: Unknown (02/13/2022)    PRAPARE - Armed forces logistics/support/administrative officer (Medical): Not on file     Lack of Transportation (Non-Medical): No   Physical Activity: Not on file   Stress: Not on file   Social Connections: Not on file   Intimate Partner Violence: Not on file   Housing Stability: Unknown (02/13/2022)    Housing Stability Vital Sign     Unable to Pay for Housing in the Last Year: Not on file     Number of Places Lived in the Last Year: Not on file     Unstable Housing in the Last Year: No     Past Surgical History:   Procedure Laterality Date    GASTROSTOMY TUBE PLACEMENT N/A 08/22/2021    EGD PEG TUBE PLACEMENT performed by Waynard Reeds, MD at [place] N/A 09/20/2021    EGD ESOPHAGOGASTRODUODENOSCOPY ULTRASOUND performed by York Spaniel, MD at [place] N/A 09/20/2021    EGD POLYP SNARE performed by York Spaniel, MD at [place]      History reviewed. No pertinent family history.   Allergies   Allergen Reactions    Bee Venom             Objective   Vitals:    02/13/22 1220   BP: 136/66   Pulse: 96   Temp: 98.5 F (36.9 C)   TempSrc: Infrared   SpO2: 99%   Weight: 56.7 kg (125 lb)     Physical Exam  HENT:      Mouth/Throat:      Mouth: Mucous membranes are moist.      Pharynx: Oropharynx is clear.      Comments: No dentition  Cardiovascular:      Rate and Rhythm: Normal rate and regular rhythm.      Pulses: Normal pulses.      Heart sounds: Normal heart sounds.   Pulmonary:      Effort: Pulmonary effort is normal.      Breath sounds: Normal breath sounds.   Abdominal:      General: Bowel sounds are normal.       Palpations: Abdomen is soft.      Comments: G tube in site. C/D/L   Musculoskeletal:      Right lower leg: No edema.      Left lower leg: No edema.   Neurological:      Mental Status: She is alert and oriented to person, place, and time.       An electronic signature was used to authenticate this note.    --Princess Perna, DO     Documentation was done using voice recognition dragon software.  Every effort was made to ensure accuracy; however, inadvertent, unintentional computerized transcription errors may be present.  Marland Kitchen

## 2022-02-13 NOTE — Patient Instructions (Addendum)
Thank you for allowing me to care for you!    Patient instructions:  1. Tai Chi per [employer]

## 2022-03-19 ENCOUNTER — Inpatient Hospital Stay: Admit: 2022-03-19 | Discharge: 2022-03-19 | Disposition: A | Discharge status: Home / Self Care | Payer: MEDICARE

## 2022-03-19 DIAGNOSIS — N6320 Unspecified lump in the left breast, unspecified quadrant: Secondary | ICD-10-CM

## 2022-03-19 DIAGNOSIS — Z803 Family history of malignant neoplasm of breast: Secondary | ICD-10-CM

## 2022-03-19 NOTE — ED Notes (Signed)
Pt denies any cp states discomfort is in her left breast, denies sob, Son states she told him she has a problem "up here" and patted her left chest, pt again asked if she has any cp, sob or fatigue pt denies states she was worried about her breast.

## 2022-03-19 NOTE — ED Provider Notes (Signed)
[place]        Pt Name: Amber Palmer  MRN: [id number]  [place] 24-Sep-1929  Date of evaluation: 03/19/2022  Provider: Jeral Fruit, PA  PCP: Princess Perna, DO  Note Started: 9:32 AM EST 03/19/22      APP. I have evaluated this patient.        CHIEF COMPLAINT       Chief Complaint   Patient presents with    Breast Problem     Left breast does not feel the same as her right breast, states it does not hurt but feels different, her niece has breast cancer so she was concerned, denies pain, redness or lumps       HISTORY OF PRESENT ILLNESS: 1 or more Elements     History From: Patient  Limitations to history : None    Amber Palmer is a 88 y.o. female with past medical history of documented dementia, hypertension and hyperlipidemia who presents ED with complaint of a left breast problem.  Patient states her left breast does not feel the same as her right breast.  She cannot clarify what she means by this.  She specifically denies any changes in the skin.  She denies any asymmetry to the left breast compared to the right.  She denies any pain.  She denies any palpable nodules or masses.  She denies any nipple retraction or nipple drainage.  She denies any chest pain, shortness of breath, abdominal pain, nausea/vomiting, urinary symptoms or changes in bowel movements.  Denies any fever or chills.  Denies any rashes or lesions.  Denies any weight loss.  She states she is concerned that she may have breast cancer.  She reports her niece had breast cancer to her left breast and has had previous mastectomy.  This diagnosis is not new.  She states for the past 2 days she has been worried that she has breast cancer.  Unsure why she is concerned she has breast cancer over the past 2 days.  She came to the ED for further evaluation and treatment.  She reports she has had mammograms in the past but unsure when.  She denies any previous history of breast  cancer.      Nursing Notes were all reviewed and agreed with or any disagreements were addressed in the HPI.    REVIEW OF SYSTEMS :      Review of Systems   Constitutional:  Negative for activity change, appetite change, chills, diaphoresis, fatigue and fever.   Respiratory: Negative.  Negative for cough, chest tightness and shortness of breath.    Cardiovascular: Negative.  Negative for chest pain, palpitations and leg swelling.   Gastrointestinal:  Negative for abdominal pain, constipation, diarrhea, nausea and vomiting.   Genitourinary:  Negative for decreased urine volume, difficulty urinating, dysuria, flank pain, frequency, hematuria and urgency.   Musculoskeletal:  Negative for arthralgias, back pain, myalgias, neck pain and neck stiffness.   Skin:  Negative for color change, pallor, rash and wound.   Neurological:  Negative for dizziness, light-headedness and headaches.       Positives and Pertinent negatives as per HPI.     SURGICAL HISTORY     Past Surgical History:   Procedure Laterality Date    GASTROSTOMY TUBE PLACEMENT N/A 08/22/2021    EGD PEG TUBE PLACEMENT performed by Waynard Reeds, MD at [facility] N/A 09/20/2021  EGD ESOPHAGOGASTRODUODENOSCOPY ULTRASOUND performed by York Spaniel, MD at [facility] 09/20/2021    EGD POLYP SNARE performed by York Spaniel, MD at [facility]       Previous Medications    AMLODIPINE (NORVASC) 10 MG TABLET    Take 1 tablet by mouth daily    ASCORBIC ACID (VITAMIN C) 250 MG TABLET    Take 1 tablet by mouth daily    ATORVASTATIN (LIPITOR) 20 MG TABLET    Take 1 tablet by mouth daily    CALCIUM ASCORBATE 500 MG TABS    Take 1 tablet by mouth    CALCIUM CARBONATE (OSCAL) 500 MG TABS TABLET    Take 1 tablet by mouth daily    DONEPEZIL (ARICEPT) 10 MG TABLET    Take 1 tablet by mouth nightly    ONDANSETRON (ZOFRAN-ODT) 4 MG DISINTEGRATING  TABLET    Take 1 tablet by mouth 3 times daily as needed for Nausea or Vomiting    TOLTERODINE (DETROL LA) 4 MG EXTENDED RELEASE CAPSULE    Take 1 capsule by mouth daily    VITAMIN D PO    Take 1 tablet by mouth daily       ALLERGIES     Bee venom    FAMILYHISTORY     History reviewed. No pertinent family history.     SOCIAL HISTORY       Social History     Tobacco Use    Smoking status: Never    Smokeless tobacco: Never   Substance Use Topics    Alcohol use: No    Drug use: Never       SCREENINGS          Glasgow Coma Scale  Eye Opening: Spontaneous  Best Verbal Response: Oriented  Best Motor Response: Obeys commands  Glasgow Coma Scale Score: 15                        CIWA Assessment  BP: (!) 169/72  Pulse: 98             PHYSICAL EXAM  1 or more Elements     ED Triage Vitals [03/19/22 0917]   BP Temp Temp Source Pulse Respirations SpO2 Height Weight - Scale   (!) 169/72 98.6 F (37 C) Oral 98 16 98 % 1.626 m ('5\' 4"'$ ) 56.9 kg (125 lb 6.4 oz)       Physical Exam  Exam conducted with a chaperone present.   Constitutional:       General: She is not in acute distress.     Appearance: Normal appearance. She is well-developed. She is not ill-appearing, toxic-appearing or diaphoretic.   HENT:      Head: Normocephalic and atraumatic.      Right Ear: External ear normal.      Left Ear: External ear normal.   Eyes:      General:         Right eye: No discharge.         Left eye: No discharge.      Conjunctiva/sclera: Conjunctivae normal.   Cardiovascular:      Rate and Rhythm: Normal rate and regular rhythm.      Pulses: Normal pulses.      Heart sounds: Normal heart sounds. No murmur heard.     No friction rub. No gallop.  Comments: 2+ radial pulses bilaterally.  No pulse deficit.  No JVD.  No calf tenderness.  No pedal edema  Pulmonary:      Effort: Pulmonary effort is normal. No respiratory distress.      Breath sounds: Normal breath sounds. No stridor. No wheezing, rhonchi or rales.   Chest:      Chest wall: No mass  or tenderness.   Breasts:     Breasts are symmetrical.      Right: No swelling, bleeding, inverted nipple, mass, nipple discharge, skin change or tenderness.      Left: Mass (Nontender mobile palpable nodule to the left superior lateral breast.  No lymphadenopathy.  No skin changes.  No peau d'orange) present. No swelling, bleeding, inverted nipple, nipple discharge, skin change or tenderness.   Abdominal:      General: Abdomen is flat. Bowel sounds are normal. There is no distension.      Palpations: Abdomen is soft. There is no mass.      Tenderness: There is no abdominal tenderness. There is no right CVA tenderness, left CVA tenderness, guarding or rebound.      Hernia: No hernia is present.   Musculoskeletal:         General: Normal range of motion.      Cervical back: Normal range of motion and neck supple.   Skin:     General: Skin is warm and dry.      Coloration: Skin is not pale.      Findings: No erythema or rash.   Neurological:      Mental Status: She is alert and oriented to person, place, and time.   Psychiatric:         Behavior: Behavior normal.         DIAGNOSTIC RESULTS   LABS:    Labs Reviewed - No data to display    When ordered only abnormal lab results are displayed. All other labs were within normal range or not returned as of this dictation.    EKG: When ordered, EKG's are interpreted by the Emergency Department Physician in the absence of a cardiologist.  Please see their note for interpretation of EKG.    RADIOLOGY:   Non-plain film images such as CT, Ultrasound and MRI are read by the radiologist. Plain radiographic images are visualized and preliminarily interpreted by the ED Provider with the below findings:      Interpretation per the Radiologist below, if available at the time of this note:    No orders to display     No results found.    No results found.    PROCEDURES   Unless otherwise noted below, none     Procedures    CRITICAL CARE TIME (.cctime)       PAST MEDICAL HISTORY       has a past medical history of Hyperlipidemia, Hypertension, Pancreatic cancer ([place]) (08/18/2021), Pancreatitis, and Vitamin D deficiency.     EMERGENCY DEPARTMENT COURSE and DIFFERENTIAL DIAGNOSIS/MDM:   Vitals:    Vitals:    03/19/22 0917   BP: (!) 169/72   Pulse: 98   Resp: 16   Temp: 98.6 F (37 C)   TempSrc: Oral   SpO2: 98%   Weight: 56.9 kg (125 lb 6.4 oz)   Height: 1.626 m ('5\' 4"'$ )       Patient was given the following medications:  Medications - No data to display          Is this  patient to be included in the SEP-1 Core Measure due to severe sepsis or septic shock?   No   Exclusion criteria - the patient is NOT to be included for SEP-1 Core Measure due to:  Infection is not suspected    Chronic Conditions affecting care:   has a past medical history of Hyperlipidemia, Hypertension, Pancreatic cancer ([place]) (08/18/2021), Pancreatitis, and Vitamin D deficiency.    CONSULTS: (Who and What was discussed)  None      Social Determinants Significantly Affecting Health : None    Records Reviewed (External and Source) Imaging Results: In the left breast, there is a 1 cm enhancing mass just superior and lateral to the nipple.  Consider diagnostic mammography and ultrasound for  further evaluation.    CC/HPI Summary, DDx, ED Course, and Reassessment: Patient is a 88 year old female with history of hypertension, hyperlipidemia and dementia who presents ED with complaint of a left breast problem.  Patient states her left breast does not feel the same as her right breast.  She cannot clarify what she means.  She specifically denies any pain or swelling to the left breast compared to the right.  She denies any skin changes.  Denies any nipple retraction or nipple drainage.  She is concerned that she may have breast cancer.  She states has been worried she had breast cancer for the past 2 days.  She states her nieces been diagnosed with breast cancer in the past.  She states her niece was diagnosed numerous months to years  ago.  It is not a new diagnosis.  She is unsure why she is now worried about breast cancer for the past 2 days.  She states she is just concerned that her left breast is not normal but cannot tell me what she means by this.  She believes she has had mammogram in the past but cannot tell me when it was.  Upon review of records it appears she has had mammograms in the past and also had MRI of her abdomen back in May.  On MRI of her abdomen there was documented 1 cm enhancing mass superior and lateral to the nipple.  She may need further outpatient imaging including mammogram or ultrasound.  Unfortunately this is not an emergent test and cannot obtain ultrasound/mammogram here in the emergency department given holiday hours.  She was informed of this.  Do not believe further imaging or workup indicated this time.  Follow-up with PCP.  Reports has appointment scheduled in 2 weeks.  Return to ED for any worsening symptoms.      I am the Primary Clinician of Record.  FINAL IMPRESSION      1. Family history of breast cancer          DISPOSITION/PLAN     DISPOSITION Decision To Discharge 03/19/2022 09:29:44 AM      PATIENT REFERRED TO:  Princess Perna, Joshua  [place] [address]  [phone]    Schedule an appointment as soon as possible for a visit   For a Re-check in  5-7    days.    [facility] Emergency Department  [address]  [place] [address]  [phone]  Go to   As needed, If symptoms worsen      DISCHARGE MEDICATIONS:  New Prescriptions    No medications on file       DISCONTINUED MEDICATIONS:  Discontinued Medications    No medications on file              (  Please note that portions of this note were completed with a voice recognition program.  Efforts were made to edit the dictations but occasionally words are mis-transcribed.)    Jeral Fruit, [place] (electronically signed)       Chaya Jan, [place]  03/19/22 [phone]

## 2022-04-13 ENCOUNTER — Encounter
Payer: MEDICARE | Attending: Student in an Organized Health Care Education/Training Program | Primary: Student in an Organized Health Care Education/Training Program

## 2022-04-13 NOTE — Progress Notes (Signed)
Patient reached _X___ yes  _____ no   VM instructions left ____ yes   phone number ________                                ____ no-office notified          Date __1/22/24_______  Time _1005______  Arrival ___0830  hosp-endo___    Nothing to eat or drink after midnight-follow your doctors prep instructions-this may include taking a second dose of your prep after midnight  Responsible adult 20 or older to stay on site while you are here-drive you home-stay with you after  Follow any instructions your doctors office has given you  Bring a complete list of all your medications and supplements including name,dose,how often taken the day of your procedure  If you normally take the following medications in the morning please do so the AM of your procedure with a small sip of water       Heart,blood pressure,seizure,thyroid or breathing medications-use your inhalers-bring any rescue inhalers with you DOS       DO NOT take blood pressure medications ending in "artan" or "pril" the AM of procedure or evening prior  Dr Denton Brick patients are not to take any medications the AM of surgery  Take half or your normal dose of any long acting insulins the night before your procedure-do not take any diabetic medications the AM of procedure  Follow your doctors instructions regarding stopping or taking  any blood thinners-if you do not have instructions-call them  Any questions call your doctor  Other _____take amlodipine am of procedure_________________________________________________________      Amber Palmer POLICY(subject to change)             The current policy is 2 visitors per patient.There are no children allowed.Mask at discretion of facility. Visiting hours are 8a-8p.Overnight visitors will be at the discretion of the nurse. All policies are subject to change.

## 2022-04-16 ENCOUNTER — Inpatient Hospital Stay: Payer: Medicare (Managed Care) | Attending: Gastroenterology

## 2022-04-16 ENCOUNTER — Ambulatory Visit: Admit: 2022-04-16 | Primary: Student in an Organized Health Care Education/Training Program

## 2022-04-16 MED ORDER — PROPOFOL 200 MG/20ML IV EMUL
200 MG/20ML | INTRAVENOUS | Status: DC | PRN
Start: 2022-04-16 — End: 2022-04-16
  Administered 2022-04-16: 15:00:00 30 via INTRAVENOUS
  Administered 2022-04-16: 15:00:00 80 via INTRAVENOUS

## 2022-04-16 MED ORDER — SODIUM CHLORIDE 0.9 % IV SOLN
0.9 % | INTRAVENOUS | Status: DC | PRN
Start: 2022-04-16 — End: 2022-04-16
  Administered 2022-04-16: 15:00:00 via INTRAVENOUS

## 2022-04-16 MED ORDER — LIDOCAINE HCL (PF) 2 % IJ SOLN
2 % | INTRAMUSCULAR | Status: DC | PRN
Start: 2022-04-16 — End: 2022-04-16
  Administered 2022-04-16: 15:00:00 60 via INTRAVENOUS

## 2022-04-16 NOTE — H&P (Signed)
Gastroenterology Note                 Pre-operative History and Physical    Patient: Amber Palmer  DOB: 05/01/1929  CSN:     History Obtained From:   Patient or guardian.      HISTORY OF PRESENT ILLNESS:    The patient is a 89 y.o. female here for Endoscopy.      Past Medical History:    Past Medical History:   Diagnosis Date    Blood transfusion declined because patient is Jehovah's Witness     Hyperlipidemia     Hypertension     Pancreatic cancer ([place]) 08/18/2021    Pancreatitis     Vitamin D deficiency      Past Surgical History:    Past Surgical History:   Procedure Laterality Date    GASTROSTOMY TUBE PLACEMENT N/A 08/22/2021    EGD PEG TUBE PLACEMENT performed by Waynard Reeds, MD at [facility] N/A 09/20/2021    EGD ESOPHAGOGASTRODUODENOSCOPY ULTRASOUND performed by York Spaniel, MD at [facility] N/A 09/20/2021    EGD POLYP SNARE performed by York Spaniel, MD at [facility]     Medications Prior to Admission:   No current facility-administered medications on file prior to encounter.     Current Outpatient Medications on File Prior to Encounter   Medication Sig Dispense Refill    ferrous sulfate (FE TABS 325) 325 (65 Fe) MG EC tablet Take 1 tablet by mouth daily (with breakfast)      Potassium 99 MG TABS Take by mouth daily (with breakfast)      amLODIPine (NORVASC) 10 MG tablet Take 1 tablet by mouth daily 90 tablet 1    atorvastatin (LIPITOR) 20 MG tablet Take 1 tablet by mouth daily 90 tablet 1    donepezil (ARICEPT) 10 MG tablet Take 1 tablet by mouth nightly 90 tablet 1    tolterodine (DETROL LA) 4 MG extended release capsule Take 1 capsule by mouth daily 90 capsule 1    ondansetron (ZOFRAN-ODT) 4 MG disintegrating tablet Take 1 tablet by mouth 3 times daily as needed for Nausea or Vomiting 21 tablet 0    VITAMIN D PO Take 1 tablet by mouth daily      Ascorbic Acid (VITAMIN C) 250 MG tablet Take  1 tablet by mouth daily      Calcium Ascorbate 500 MG TABS Take 1 tablet by mouth      calcium carbonate (OSCAL) 500 MG TABS tablet Take 1 tablet by mouth daily          Allergies:  Bee venom      Social History:   Social History     Tobacco Use    Smoking status: Never    Smokeless tobacco: Never   Substance Use Topics    Alcohol use: No     Family History:   History reviewed. No pertinent family history.    PHYSICAL EXAM:      BP (!) 134/57   Pulse 85   Temp 98.9 F (37.2 C) (Temporal)   Resp 18   Ht 1.626 m (_0 )   Wt 56.7 kg (125 lb)   SpO2 100%   BMI 21.46 kg/m  I        Heart:  RRR, normal s1s2    Lungs:  CTA and normal effort    Abdomen:  Soft, nt nd.         ASSESSMENT AND PLAN:    1.  Patient is a 89 y.o. female here for endoscopy with MAC sedation.    2.  Procedure options, risks and benefits reviewed with patient and/or guardian.  They express understanding.

## 2022-04-16 NOTE — Progress Notes (Signed)
Pt awake and alert at this time. Pt on RA, and VSS. Pt denies pain and nausea, tolerating PO.  Pt meets criteria to be discharged from Phase 1.

## 2022-04-16 NOTE — Progress Notes (Signed)
Discharge instructions review with patient and son Amber Palmer. All home medications have been reviewed, pt v/u. Discharge instructions signed. Pt discharged via wheelchair. Pt discharged with all belongings. Amber Palmer taking stable pt home.

## 2022-04-16 NOTE — Anesthesia Pre-Procedure Evaluation (Signed)
Department of Anesthesiology  Preprocedure Note       Name:  Amber Palmer   Age:  89 y.o.  DOB:  1929-05-18                                          MRN:  [id number]         Date:  04/16/2022      Surgeon: Juliann Mule):  Waynard Reeds, MD    Procedure: Procedure(s):  EGD WITH PEG TUBE REMOVAL    Medications prior to admission:   Prior to Admission medications    Medication Sig Start Date End Date Taking? Authorizing Provider   ferrous sulfate (FE TABS 325) 325 (65 Fe) MG EC tablet Take 1 tablet by mouth daily (with breakfast)   Yes [provider]   Potassium 99 MG TABS Take by mouth daily (with breakfast)   Yes [provider]   amLODIPine (NORVASC) 10 MG tablet Take 1 tablet by mouth daily 02/13/22 08/12/22  Princess Perna, DO   atorvastatin (LIPITOR) 20 MG tablet Take 1 tablet by mouth daily 02/13/22 08/12/22  Princess Perna, DO   donepezil (ARICEPT) 10 MG tablet Take 1 tablet by mouth nightly 02/13/22 08/12/22  Princess Perna, DO   tolterodine (DETROL LA) 4 MG extended release capsule Take 1 capsule by mouth daily 02/13/22 08/12/22  Princess Perna, DO   ondansetron (ZOFRAN-ODT) 4 MG disintegrating tablet Take 1 tablet by mouth 3 times daily as needed for Nausea or Vomiting 01/29/22   Waldo Laine, MD   VITAMIN D PO Take 1 tablet by mouth daily    [provider]   Ascorbic Acid (VITAMIN C) 250 MG tablet Take 1 tablet by mouth daily    [provider]   Calcium Ascorbate 500 MG TABS Take 1 tablet by mouth 02/11/09   [provider]   calcium carbonate (OSCAL) 500 MG TABS tablet Take 1 tablet by mouth daily    [provider]       Current medications:    No current facility-administered medications for this encounter.       Allergies:    Allergies   Allergen Reactions   . Bee Venom      Swelling at site       Problem List:    Patient Active Problem List   Diagnosis Code   . Hypertension I10   . Hyperlipidemia E78.5   . Vitamin D deficiency E55.9   . 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 Severe malnutrition ([place]) E43   . Weight loss R63.4   . Elevated lipase R74.8   . Dementia ([place]) F03.90       Past Medical History:        Diagnosis Date   . Blood transfusion declined because patient is Jehovah's Witness    . Hyperlipidemia    . Hypertension    . Pancreatic cancer ([place]) 08/18/2021   . Pancreatitis    . Vitamin D deficiency        Past Surgical History:        Procedure Laterality Date   . GASTROSTOMY TUBE PLACEMENT N/A 08/22/2021    EGD PEG TUBE PLACEMENT performed by Waynard Reeds, MD at [facility]   . UPPER GASTROINTESTINAL ENDOSCOPY N/A 09/20/2021    EGD ESOPHAGOGASTRODUODENOSCOPY ULTRASOUND performed by York Spaniel, MD at [facility]  ASC ENDOSCOPY   . UPPER GASTROINTESTINAL ENDOSCOPY N/A 09/20/2021    EGD POLYP SNARE performed by York Spaniel, MD at [place] History:    Social History     Tobacco Use   . Smoking status: Never   . Smokeless tobacco: Never   Substance Use Topics   . Alcohol use: No                                Counseling given: Not Answered      Vital Signs (Current):   Vitals:    04/13/22 1451   Weight: 56.7 kg (125 lb)   Height: 1.626 m (_0 )                                              BP Readings from Last 3 Encounters:   03/19/22 (!) 169/72   02/13/22 136/66   01/29/22 (!) 172/73       NPO Status:                                                                                 BMI:   Wt Readings from Last 3 Encounters:   04/13/22 56.7 kg (125 lb)   03/19/22 56.9 kg (125 lb 6.4 oz)   02/13/22 56.7 kg (125 lb)     Body mass index is 21.46 kg/m.    CBC:   Lab Results   Component Value Date/Time    WBC 7.5 01/29/2022 03:41 PM    RBC 4.12 01/29/2022 03:41 PM    HGB 13.7 01/29/2022 03:41 PM    HCT 41.1 01/29/2022 03:41 PM    MCV 99.9 01/29/2022 03:41 PM    RDW 12.9 01/29/2022 03:41 PM    PLT 143 01/29/2022 03:41 PM       CMP:   Lab Results   Component Value Date/Time    NA 142 01/29/2022 03:41 PM    K 3.7 01/29/2022  03:41 PM    CL 102 01/29/2022 03:41 PM    CO2 25 01/29/2022 03:41 PM    BUN 17 01/29/2022 03:41 PM    CREATININE 0.8 01/29/2022 03:41 PM    GFRAA >60 08/15/2017 01:42 PM    AGRATIO 1.2 01/29/2022 03:41 PM    LABGLOM >60 01/29/2022 03:41 PM    GLUCOSE 176 01/29/2022 03:41 PM    PROT 7.9 01/29/2022 03:41 PM    CALCIUM 9.8 01/29/2022 03:41 PM    BILITOT 0.4 01/29/2022 03:41 PM    ALKPHOS 78 01/29/2022 03:41 PM    AST 23 01/29/2022 03:41 PM    ALT 20 01/29/2022 03:41 PM       POC Tests: No results for input(s): "POCGLU", "POCNA", "POCK", "POCCL", "POCBUN", "POCHEMO", "POCHCT" in the last 72 hours.    Coags:   Lab Results   Component Value Date/Time    PROTIME 12.3 01/29/2022 03:41 PM    INR 0.91 01/29/2022 03:41 PM    APTT 23.7 06/20/2016  05:24 AM       HCG (If Applicable): No results found for: "PREGTESTUR", "PREGSERUM", "HCG", "HCGQUANT"     ABGs: No results found for: "PHART", "PO2ART", "PCO2ART", "HCO3ART", "BEART", "O2SATART"     Type & Screen (If Applicable):  No results found for: "LABABO", "LABRH"    Drug/Infectious Status (If Applicable):  No results found for: "HIV", "HEPCAB"    COVID-19 Screening (If Applicable): No results found for: "COVID19"        Anesthesia Evaluation  Patient summary reviewed and Nursing notes reviewed  Airway: Mallampati: II  TM distance: >3 FB   Neck ROM: full  Mouth opening: > = 3 FB   Dental:          Pulmonary:                              Cardiovascular:  Exercise tolerance: poor (<4 METS)  (+) hypertension: moderate                  Neuro/Psych:   (+) neuromuscular disease:, psychiatric history: stable with treatment            GI/Hepatic/Renal:             Endo/Other:                     Abdominal:             Vascular:          Other Findings:         Anesthesia Plan      MAC     ASA 3       Induction: intravenous.    MIPS: Prophylactic antiemetics administered.  Anesthetic plan and risks discussed with patient.      Plan discussed with CRNA.                Wray Kearns, MD    04/16/2022

## 2022-04-16 NOTE — Progress Notes (Signed)
Teaching / education initiated regarding perioperative experience, expectations, and pain management during stay. Patient verbalized understanding.

## 2022-04-16 NOTE — Progress Notes (Signed)
Pt arrived from ENDO to PACU bay 7. Report received from ENDO staff. Pt not arousable to voice. Surgical incisions dressings in place to L side of ABD. Pt on RA, NSR, and VSS. Will continue to monitor.

## 2022-04-16 NOTE — Anesthesia Post-Procedure Evaluation (Signed)
Department of Anesthesiology  Postprocedure Note    Patient: Amber [place]  MRN: [id number]  Birthdate: [birth year]  Date of evaluation: 04/16/2022    Procedure Summary       Date: 04/16/22 Room / Location: [place] / [facility]    Anesthesia Start: 1010 Anesthesia Stop: 3536    Procedure: EGD WITH PEG TUBE REMOVAL (Abdomen) Diagnosis:       S/P percutaneous endoscopic gastrostomy (PEG) tube placement ([place])      Polyp of stomach and duodenum      (S/P percutaneous endoscopic gastrostomy (PEG) tube placement ([place]) [Z93.1])      (Polyp of stomach and duodenum [K31.7])    Surgeons: Waynard Reeds, MD Responsible Provider: Wray Kearns, MD    Anesthesia Type: MAC ASA Status: 3            Anesthesia Type: No value filed.    Aldrete Phase I: Aldrete Score: 10    Aldrete Phase II: Aldrete Score: 10    Anesthesia Post Evaluation    Patient location during evaluation: PACU  Patient participation: complete - patient participated  Level of consciousness: awake and awake and alert  Pain score: 2  Airway patency: patent  Nausea & Vomiting: no vomiting  Cardiovascular status: blood pressure returned to baseline  Respiratory status: acceptable  Hydration status: euvolemic  Multimodal analgesia pain management approach  Pain management: adequate    No notable events documented.

## 2022-04-16 NOTE — Discharge Instructions (Signed)
GENERAL SURGERY DISCHARGE INSTRUCTIONS    Follow your surgeons instructions.  Follow up with your surgeon as directed.  Observe the operative area for signs of excessive bleeding.If needed apply pressure,elevate if able and contact your surgeon.  Observe the operative site for any signs of infection- such as increased pain,redness,fever greater than 101 degrees,swelling, foul odor or drainage.Contact your surgeon if any of these symptoms are present.  Keep operative site clean and dry.  Do not remove dressing unless instructed to by surgeon.  Apply ice as directed.  Avoid pulling,pushing or tugging to suture line.  If you become short of breath call your doctor or go to the ER.  Take medications as directed.  Pain medication should be taken with food.  Do not drive or operate machinery while taking narcotics.  For any problems or question call your surgeon.     ANESTHESIA DISCHARGE INSTRUCTIONS    Wear your seatbelt home.  You are under the influence of drugs-do not drink alcohol, drive, operate machinery, make any important decisions or sign any legal documents for 24 hours.  Children should not ride bikes, [employer] or play on gym sets for 24 hours after surgery.  A responsible adult needs to be with you for 24 hours.  You may experience lightheadedness, dizziness, or sleepiness following surgery.  Rest at home today- increase activity as tolerated.  Progress slowly to a regular diet unless your physician has instructed you otherwise. Drink plenty of water.  If persistent nausea and vomiting becomes a problem, call your physician.  Coughing, sore throat and muscle aches are other side effects of anesthesia, and should improve with time.  Do not drive or operate machinery while taking narcotics.

## 2022-04-16 NOTE — Progress Notes (Signed)
Reviewed patient's medical and surgical history in electronic record and with patient at the bedside. All questions regarding procedure answered.   Scope verified using 2 person system.  Family in waiting room.  Electronically signed by Debroah Baller, RN on 04/16/2022 at 10:12 AM

## 2022-04-20 ENCOUNTER — Encounter
Admit: 2022-04-20 | Discharge: 2022-04-20 | Payer: MEDICARE | Attending: Student in an Organized Health Care Education/Training Program | Primary: Student in an Organized Health Care Education/Training Program

## 2022-04-20 ENCOUNTER — Encounter

## 2022-04-20 DIAGNOSIS — E782 Mixed hyperlipidemia: Secondary | ICD-10-CM

## 2022-04-20 NOTE — Progress Notes (Signed)
Amber Palmer (DOB:  1929-04-21) is a 89 y.o. female,Established patient, here for evaluation of the following chief complaint(s):  Hypertension (Follow up HTN )      Assessment/Plan:     1. Mixed hyperlipidemia  - Continue Lipitor 20 mg  - Lipid, Fasting; Future    2. Hyperglycemia  - Lipid, Fasting; Future  - Hemoglobin A1C; Future    3. Primary hypertension  - BP controlled  - Continue Amlodipine 10 mg  - BP Goal <140/90, Patient to bring BP log to next visit, Mediterranean lifestyle discussed and encouraged; handout given, and DASH diet encouraged        Return in about 3 months (around 07/20/2022) for AWV OVE.           Subjective   SUBJECTIVE/OBJECTIVE:  HPI:     Hypertension  Managed with Amlodipine 10 mg   Takes as prescribed: Yes  Adverse effects?: no  Denies Chest Pain, SOB, Headache, Visual changes, Lightheadedness, Dizziness, and LE Edema  She does have a BP cuff at home    Does not measure BP at home     She is not adherent to a low sodium diet. Sons prepare food   G-tube recently removed  Physical activity:    Wt Readings from Last 3 Encounters:   04/20/22 56.8 kg (125 lb 3.2 oz)   04/13/22 56.7 kg (125 lb)   03/19/22 56.9 kg (125 lb 6.4 oz)        Sodium (mmol/L)   Date Value   01/29/2022 142   08/25/2021 143     Potassium (mmol/L)   Date Value   08/25/2021 4.0   08/24/2021 3.6     Potassium reflex Magnesium (mmol/L)   Date Value   01/29/2022 3.7     Chloride (mmol/L)   Date Value   01/29/2022 102   08/25/2021 106     CO2 (mmol/L)   Date Value   01/29/2022 25   08/25/2021 32     BUN (mg/dL)   Date Value   01/29/2022 17   08/25/2021 8     Creatinine (mg/dL)   Date Value   01/29/2022 0.8   08/25/2021 0.6     Glucose (mg/dL)   Date Value   01/29/2022 176 (H)   08/25/2021 150 (H)      Hyperlipidemia managed with atorvastatin 20 mg.  Patient is tolerating.    History of recurrent hypoglycemia per chart review on labs, diagnosis of prediabetes or diabetes in the past    Patient Active Problem List    Diagnosis    Hypertension    Hyperlipidemia    Vitamin D deficiency    Failure to thrive in adult    Severe malnutrition (HCC)    Weight loss    Elevated lipase    Dementia (HCC)     Past Medical History:   Diagnosis Date    Blood transfusion declined because patient is Jehovah's Witness     Hyperlipidemia     Hypertension     Pancreatic cancer ([place]) 08/18/2021    Pancreatitis     Vitamin D deficiency      Prior to Admission medications    Medication Sig Start Date End Date Taking? Authorizing Provider   ferrous sulfate (FE TABS 325) 325 (65 Fe) MG EC tablet Take 1 tablet by mouth daily (with breakfast)   Yes [provider]   amLODIPine (NORVASC) 10 MG tablet Take 1 tablet by mouth daily 02/13/22 08/12/22 Yes Princess Perna,  DO   atorvastatin (LIPITOR) 20 MG tablet Take 1 tablet by mouth daily 02/13/22 08/12/22 Yes Princess Perna, DO   donepezil (ARICEPT) 10 MG tablet Take 1 tablet by mouth nightly 02/13/22 08/12/22 Yes Princess Perna, DO   tolterodine (DETROL LA) 4 MG extended release capsule Take 1 capsule by mouth daily 02/13/22 08/12/22 Yes Princess Perna, DO   VITAMIN D PO Take 1 tablet by mouth daily   Yes [provider]   Ascorbic Acid (VITAMIN C) 250 MG tablet Take 1 tablet by mouth daily   Yes [provider]   calcium carbonate (OSCAL) 500 MG TABS tablet Take 1 tablet by mouth daily   Yes [provider]   Potassium 99 MG TABS Take by mouth daily (with breakfast)  Patient not taking: Reported on 04/20/2022    [provider]   ondansetron (ZOFRAN-ODT) 4 MG disintegrating tablet Take 1 tablet by mouth 3 times daily as needed for Nausea or Vomiting 01/29/22   Waldo Laine, MD   Calcium Ascorbate 500 MG TABS Take 1 tablet by mouth 02/11/09   [provider]     Social History     Socioeconomic History    Marital status: Divorced     Spouse name: Not on file    Number of children: Not on file    Years of education: Not on file    Highest education level:  Not on file   Occupational History    Not on file   Tobacco Use    Smoking status: Never    Smokeless tobacco: Never   Substance and Sexual Activity    Alcohol use: No    Drug use: Never    Sexual activity: Not on file   Other Topics Concern    Not on file   Social History Narrative    Not on file     Social Determinants of Health     Financial Resource Strain: Low Risk  (02/13/2022)    Overall Financial Resource Strain (CARDIA)     Difficulty of Paying Living Expenses: Not hard at all   Food Insecurity: Not on file (02/13/2022)   Transportation Needs: Unknown (02/13/2022)    PRAPARE - Armed forces logistics/support/administrative officer (Medical): Not on file     Lack of Transportation (Non-Medical): No   Physical Activity: Not on file   Stress: Not on file   Social Connections: Not on file   Intimate Partner Violence: Not on file   Housing Stability: Unknown (02/13/2022)    Housing Stability Vital Sign     Unable to Pay for Housing in the Last Year: Not on file     Number of Places Lived in the Last Year: Not on file     Unstable Housing in the Last Year: No     Past Surgical History:   Procedure Laterality Date    GASTROSTOMY TUBE PLACEMENT N/A 08/22/2021    EGD PEG TUBE PLACEMENT performed by Waynard Reeds, MD at [place] 04/16/2022    REMOVAL PEG TUBE performed by Waynard Reeds, MD at [place] N/A 09/20/2021    EGD ESOPHAGOGASTRODUODENOSCOPY ULTRASOUND performed by York Spaniel, MD at [place] N/A 09/20/2021    EGD POLYP SNARE performed by York Spaniel, MD at [place] N/A 04/16/2022  EGD WITH PEG TUBE REMOVAL performed by Waynard Reeds, MD at [place]      History reviewed. No pertinent family history.   Allergies   Allergen Reactions    Bee Venom      Swelling at site        Objective   Vitals:    04/20/22 1107   BP: 138/74    Pulse: 85   Resp: 16   Temp: 98.3 F (36.8 C)   TempSrc: Temporal   SpO2: 99%   Weight: 56.8 kg (125 lb 3.2 oz)     Physical Exam  Constitutional:       Appearance: Normal appearance.   Cardiovascular:      Rate and Rhythm: Normal rate and regular rhythm.      Pulses: Normal pulses.      Heart sounds: Normal heart sounds.   Pulmonary:      Effort: Pulmonary effort is normal.      Breath sounds: Normal breath sounds.   Abdominal:      General: Bowel sounds are normal. There is no distension.      Palpations: Abdomen is soft.      Tenderness: There is no abdominal tenderness.      Comments: Tube site closed  C/D/I   Musculoskeletal:      Right lower leg: Edema present.      Left lower leg: Edema present.      Comments: +1 pitting edema b/l   Neurological:      Mental Status: She is alert.         An electronic signature was used to authenticate this note.    --Princess Perna, DO     Documentation was done using voice recognition dragon software.  Every effort was made to ensure accuracy; however, inadvertent, unintentional computerized transcription errors may be present.  Marland Kitchen

## 2022-04-21 LAB — LIPID, FASTING
Cholesterol, Fasting: 164 mg/dL (ref 0–199)
HDL: 63 mg/dL — ABNORMAL HIGH (ref 40–60)
LDL Calculated: 93 mg/dL (ref <100)
Triglyceride, Fasting: 42 mg/dL (ref 0–150)
VLDL Cholesterol Calculated: 8 mg/dL

## 2022-04-21 LAB — HEMOGLOBIN A1C
Estimated Avg Glucose: 111.2 mg/dL
Hemoglobin A1C: 5.5 %

## 2022-06-06 ENCOUNTER — Encounter

## 2022-06-06 MED ORDER — AMLODIPINE BESYLATE 10 MG PO TABS
10 | ORAL_TABLET | Freq: Every day | ORAL | 1 refills | Status: DC
Start: 2022-06-06 — End: 2022-08-17

## 2022-06-06 MED ORDER — TOLTERODINE TARTRATE ER 4 MG PO CP24
4 | ORAL_CAPSULE | Freq: Every day | ORAL | 1 refills | Status: DC
Start: 2022-06-06 — End: 2022-08-17

## 2022-06-06 MED ORDER — DONEPEZIL HCL 10 MG PO TABS
10 | ORAL_TABLET | Freq: Every evening | ORAL | 1 refills | Status: DC
Start: 2022-06-06 — End: 2022-08-17

## 2022-06-06 MED ORDER — ATORVASTATIN CALCIUM 20 MG PO TABS
20 | ORAL_TABLET | Freq: Every day | ORAL | 1 refills | Status: DC
Start: 2022-06-06 — End: 2022-08-28

## 2022-06-06 NOTE — Telephone Encounter (Signed)
Last appointment: 04/20/2022  Next appointment: 07/20/2022  Last refill: 02/13/22

## 2022-07-20 ENCOUNTER — Encounter
Payer: MEDICARE | Attending: Student in an Organized Health Care Education/Training Program | Primary: Student in an Organized Health Care Education/Training Program

## 2022-08-02 ENCOUNTER — Encounter
Admit: 2022-08-02 | Discharge: 2022-08-02 | Payer: MEDICARE | Attending: Student in an Organized Health Care Education/Training Program | Primary: Student in an Organized Health Care Education/Training Program

## 2022-08-02 DIAGNOSIS — Z0000 Encounter for general adult medical examination without abnormal findings: Secondary | ICD-10-CM

## 2022-08-02 MED ORDER — RSVPREF3 VAC RECOMB ADJUVANTED 120 MCG/0.5ML IM SUSR
120 | Freq: Once | INTRAMUSCULAR | 0 refills | Status: AC
Start: 2022-08-02 — End: 2022-08-02

## 2022-08-02 MED ORDER — SHINGRIX 50 MCG/0.5ML IM SUSR
50 | INTRAMUSCULAR | 0 refills | Status: AC
Start: 2022-08-02 — End: 2023-01-29

## 2022-08-02 NOTE — Patient Instructions (Signed)
Learning About Mild Cognitive Impairment (MCI)  What is mild cognitive impairment (MCI)?     It's common to forget things sometimes as we get older. But some older people have memory loss that's more than normal aging. It's called mild cognitive impairment, or MCI. It is not the same as dementia.  People with the condition often know that their memory or mental function has changed. Tests may show some loss. But their minds work well overall. They can carry out daily tasks that are normal for them.  People with MCI have a higher chance of one day getting dementia. But not all people who have it will get dementia. Some people may stay the same over time.  What are the symptoms?  People with MCI have more memory loss than what occurs with normal aging. They may have increasing trouble with recalling words and keeping up with conversations. They may also have trouble remembering important events and making decisions.  What puts you at risk?  The risk of getting MCI increases with age. Having high blood pressure or having a family history of MCI may also increase your risk.  How is it diagnosed?  Your doctor will do a physical exam.  You may be asked questions to check your memory and other mental skills. Your doctor may also talk to close friends and family members. This can help the doctor figure out how your memory and other mental skills have changed.  You may get blood tests and tests that look at your brain.  These questions and tests can make sure you don't have other conditions that can cause symptoms like MCI. These include depression, sleep problems, and side effects from medicines.  How is it treated?  There are no medicines to treat MCI or to keep it from progressing to dementia. But treating conditions like high blood pressure and diabetes may help. A person with MCI needs routine follow-up visits with their doctor to check on changes in the person's mental skills.  How can you care for yourself at  home?  Keeping your body active can help slow MCI. Exercises like walking can help. Try to stay active mentally too. Read or do things like crossword puzzles if you enjoy doing them.  If you need help coping with MCI, you may want to get support from family, friends, a support group, or a counselor who works with people who have [place].  Though the future isn't always clear, it can be good to plan ahead with instructions for your care. These are called advanced directives. Having a plan can help make sure that you get the care you want.  Current as of: December 20, 2023Content Version: 14.0   2006-2024 [employer].   Care instructions adapted under license by [facility]. If you have questions about a medical condition or this instruction, always ask your healthcare professional. [place] any warranty or liability for your use of this information.           A Healthy Heart: Care Instructions  Overview     Coronary artery disease, also called heart disease, occurs when a substance called plaque builds up in the vessels that supply oxygen-rich blood to your heart muscle. This can narrow the blood vessels and reduce blood flow. A heart attack happens when blood flow is completely blocked. A high-fat diet, smoking, and other factors increase the risk of heart disease.  Your doctor has found that you have a chance of having heart disease.  A heart-healthy lifestyle can help keep your heart healthy and prevent heart disease. This lifestyle includes eating healthy, being active, staying at a weight that's healthy for you, and not smoking or using tobacco. It also includes taking medicines as directed, managing other health conditions, and trying to get a healthy amount of sleep.  Follow-up care is a key part of your treatment and safety. Be sure to make and go to all appointments, and call your doctor if you are having problems. It's also a good idea to know your test  results and keep a list of the medicines you take.  How can you care for yourself at home?  Diet   Use less salt when you cook and eat. This helps lower your blood pressure. Taste food before salting. Add only a little salt when you think you need it. With time, your taste buds will adjust to less salt.    Eat fewer snack items, fast foods, canned soups, and other high-salt, high-fat, processed foods.    Read food labels and try to avoid saturated and trans fats. They increase your risk of heart disease by raising cholesterol levels.    Limit the amount of solid fat--butter, margarine, and shortening--you eat. Use olive, peanut, or canola oil when you cook. Bake, broil, and steam foods instead of frying them.    Eat a variety of fruit and vegetables every day. Dark green, deep orange, red, or yellow fruits and vegetables are especially good for you. Examples include spinach, carrots, peaches, and berries.    Foods high in fiber can reduce your cholesterol and provide important vitamins and minerals. High-fiber foods include whole-grain cereals and breads, oatmeal, beans, brown rice, citrus fruits, and apples.    Eat lean proteins. Heart-healthy proteins include seafood, lean meats and poultry, eggs, beans, peas, nuts, seeds, and soy products.    Limit drinks and foods with added sugar. These include candy, desserts, and soda pop.   Heart-healthy lifestyle   If your doctor recommends it, get more exercise. For many people, walking is a good choice. Or you may want to swim, bike, or do other activities. Bit by bit, increase the time you're active every day. Try for at least 30 minutes on most days of the week.    Try to quit or cut back on using tobacco and other nicotine products. This includes smoking and vaping. If you need help quitting, talk to your doctor about stop-smoking programs and medicines. These can increase your chances of quitting for good. Quitting is one of the most important things you can  do to protect your heart. It is never too late to quit. Try to avoid secondhand smoke too.    Stay at a weight that's healthy for you. Talk to your doctor if you need help losing weight.    Try to get 7 to 9 hours of sleep each night.    Limit alcohol to 2 drinks a day for men and 1 drink a day for women. Too much alcohol can cause health problems.    Manage other health problems such as diabetes, high blood pressure, and high cholesterol. If you think you may have a problem with alcohol or drug use, talk to your doctor.   Medicines   Take your medicines exactly as prescribed. Call your doctor if you think you are having a problem with your medicine.    If your doctor recommends aspirin, take the amount directed each day. Make sure you take aspirin  and not another kind of pain reliever, such as acetaminophen (Tylenol).   When should you call for help?   Call 911 if you have symptoms of a heart attack. These may include:   Chest pain or pressure, or a strange feeling in the chest.    Sweating.    Shortness of breath.    Pain, pressure, or a strange feeling in the back, neck, jaw, or upper belly or in one or both shoulders or arms.    Lightheadedness or sudden weakness.    A fast or irregular heartbeat.   After you call 911, the operator may tell you to chew 1 adult-strength or 2 to 4 low-dose aspirin. Wait for an ambulance. Do not try to drive yourself.  Watch closely for changes in your health, and be sure to contact your doctor if you have any problems.  Where can you learn more?  Go to [employer] and enter F075 to learn more about "A Healthy Heart: Care Instructions."  Current as of: June 24, 2023Content Version: 14.0   2006-2024 [employer].   Care instructions adapted under license by [facility]. If you have questions about a medical condition or this instruction, always ask your healthcare professional. Healthwise, Inc[employer] disclaims any  warranty or liability for your use of this information.      Personalized Preventive Plan for Amber [place] - 08/02/2022  Medicare offers a range of preventive health benefits. Some of the tests and screenings are paid in full while other may be subject to a deductible, co-insurance, and/or copay.    Some of these benefits include a comprehensive review of your medical history including lifestyle, illnesses that may run in your family, and various assessments and screenings as appropriate.    After reviewing your medical record and screening and assessments performed today your provider may have ordered immunizations, labs, imaging, and/or referrals for you.  A list of these orders (if applicable) as well as your Preventive Care list are included within your After Visit Summary for your review.    Other Preventive Recommendations:    A preventive eye exam performed by an eye specialist is recommended every 1-2 years to screen for glaucoma; cataracts, macular degeneration, and other eye disorders.  A preventive dental visit is recommended every 6 months.  Try to get at least 150 minutes of exercise per week or 10,000 steps per day on a pedometer .  Order or download the FREE "Exercise & Physical Activity: Your Everyday Guide" from [employer]. Call (817) 159-5803 or search [employer] online.  You need 1200-1500 mg of calcium and 1000-2000 IU of vitamin D per day. It is possible to meet your calcium requirement with diet alone, but a vitamin D supplement is usually necessary to meet this goal.  When exposed to the sun, use a sunscreen that protects against both UVA and UVB radiation with an SPF of 30 or greater. Reapply every 2 to 3 hours or after sweating, drying off with a towel, or swimming.  Always wear a seat belt when traveling in a car. Always wear a helmet when riding a bicycle or motorcycle.

## 2022-08-02 NOTE — Progress Notes (Signed)
(  16109) Cerumen Removal - Irrigation/Lvg    Date/Time: 08/02/2022 1:26 PM    Performed by: Fortunato Curling, MA  Authorized by: Lesia Sago, DO

## 2022-08-02 NOTE — Progress Notes (Signed)
Medicare Annual Wellness Visit    Amber Palmer is here for Medicare AWV    Assessment & Plan   Medicare annual wellness visit, subsequent  -     respiratory syncytial vaccine, adjuvanted (AREXVY) 120 MCG/0.5ML injection; Inject 0.5 mLs into the muscle once for 1 dose, Disp-0.5 mL, R-0Print  Need for vaccination  -     zoster recombinant adjuvanted vaccine [facility]) 50 MCG/0.5ML SUSR injection; Inject 0.5 mLs into the muscle See Admin Instructions 1 dose now and repeat in 2-6 months, Disp-0.5 mL, R-0Print  -     respiratory syncytial vaccine, adjuvanted (AREXVY) 120 MCG/0.5ML injection; Inject 0.5 mLs into the muscle once for 1 dose, Disp-0.5 mL, R-0Print  Bilateral impacted cerumen  -     (16109) Cerumen Removal - Irrigation/Lvg  Dementia without behavioral disturbance, psychotic disturbance, mood disturbance, or anxiety, unspecified dementia severity, unspecified dementia type (HCC)  Recommendations for Preventive Services Due: see orders and patient instructions/AVS.  Recommended screening schedule for the next 5-10 years is provided to the patient in written form: see Patient Instructions/AVS.     Return for dementia, MOCA.     Subjective       Patient's complete Health Risk Assessment and screening values have been reviewed and are found in Flowsheets. The following problems were reviewed today and where indicated follow up appointments were made and/or referrals ordered.    Positive Risk Factor Screenings with Interventions:       Cognitive:   Clock Drawing Test (CDT): (!) Abnormal  Words recalled: 1 Word Recalled  Total Score: (!) 1  Total Score Interpretation: Abnormal Mini-Cog  Interventions:  See AVS for additional education material  Hx of dementia on donepezil 10 mg                   Activity, Diet, and Weight:  On average, how many days per week do you engage in moderate to strenuous exercise (like a brisk walk)?: 0 days  On average, how many minutes do you engage in exercise at this level?: 10 min    Do  you eat balanced/healthy meals regularly?: Yes    Body mass index is 23.76 kg/m.      Inactivity Interventions:  See AVS for additional education material         Hearing Screen:  Do you or your family notice any trouble with your hearing that hasn't been managed with hearing aids?: (!) Yes    Interventions:  Patient declines any further evaluation or treatment     Safety:  Do you have non-slip mats or non-slip surfaces or shower bars or grab bars in your shower or bathtub?: (!) No  Interventions:  Patient declined any further interventions or treatment              Objective   Vitals:    08/02/22 1255   BP: 108/62   Site: Right Upper Arm   Position: Sitting   Cuff Size: Medium Adult   Pulse: 83   SpO2: 98%   Weight: 54.9 kg (121 lb)   Height: 1.52 m (4' 11.84")      Body mass index is 23.76 kg/m.               Allergies   Allergen Reactions    Bee Venom      Swelling at site     Prior to Visit Medications    Medication Sig Taking? Authorizing Provider   zoster recombinant adjuvanted vaccine [facility]) 50 MCG/0.5ML  SUSR injection Inject 0.5 mLs into the muscle See Admin Instructions 1 dose now and repeat in 2-6 months Yes Lesia Sago, DO   respiratory syncytial vaccine, adjuvanted (AREXVY) 120 MCG/0.5ML injection Inject 0.5 mLs into the muscle once for 1 dose Yes Jalah Warmuth, DO   atorvastatin (LIPITOR) 20 MG tablet Take 1 tablet by mouth daily Yes Lesia Sago, DO   amLODIPine (NORVASC) 10 MG tablet Take 1 tablet by mouth daily Yes Lesia Sago, DO   donepezil (ARICEPT) 10 MG tablet Take 1 tablet by mouth nightly Yes Lesia Sago, DO   ferrous sulfate (FE TABS 325) 325 (65 Fe) MG EC tablet Take 1 tablet by mouth daily (with breakfast) Yes [provider]   Potassium 99 MG TABS Take by mouth daily (with breakfast) Yes [provider]   ondansetron (ZOFRAN-ODT) 4 MG disintegrating tablet Take 1 tablet by mouth 3 times daily as needed for Nausea or Vomiting Yes Dalbert Batman, MD    VITAMIN D PO Take 1 tablet by mouth daily Yes [provider]   Ascorbic Acid (VITAMIN C) 250 MG tablet Take 1 tablet by mouth daily Yes [provider]   Calcium Ascorbate 500 MG TABS Take 1 tablet by mouth Yes [provider]   calcium carbonate (OSCAL) 500 MG TABS tablet Take 1 tablet by mouth daily Yes [provider]   tolterodine (DETROL LA) 4 MG extended release capsule Take 1 capsule by mouth daily  Patient not taking: Reported on 08/02/2022  Lesia Sago, DO       CareTeam (Including outside providers/suppliers regularly involved in providing care):   Patient Care Team:  Lesia Sago, DO as PCP - General (Family Medicine)  Lesia Sago, DO as PCP - Empaneled Provider     Reviewed and updated this visit:  Tobacco  Allergies  Meds  Problems  Med Hx  Surg Hx  Soc Hx  Fam Hx

## 2022-08-14 ENCOUNTER — Encounter

## 2022-08-15 NOTE — Telephone Encounter (Signed)
Last appointment: 08/02/2022  Next appointment: 09/14/2022  Last refill: 06/06/22

## 2022-08-17 MED ORDER — AMLODIPINE BESYLATE 10 MG PO TABS
10 MG | ORAL_TABLET | Freq: Every day | ORAL | 1 refills | Status: DC
Start: 2022-08-17 — End: 2023-02-07

## 2022-08-17 MED ORDER — DONEPEZIL HCL 10 MG PO TABS
10 MG | ORAL_TABLET | Freq: Every evening | ORAL | 1 refills | Status: DC
Start: 2022-08-17 — End: 2023-02-07

## 2022-08-17 MED ORDER — TOLTERODINE TARTRATE ER 4 MG PO CP24
4 MG | ORAL_CAPSULE | Freq: Every day | ORAL | 1 refills | Status: DC
Start: 2022-08-17 — End: 2023-02-07

## 2022-08-28 ENCOUNTER — Encounter

## 2022-08-28 MED ORDER — ATORVASTATIN CALCIUM 20 MG PO TABS
20 | ORAL_TABLET | Freq: Every day | ORAL | 2 refills | 90.00000 days | Status: DC
Start: 2022-08-28 — End: 2023-10-07

## 2022-08-28 NOTE — Telephone Encounter (Signed)
Last appointment: 08/02/2022  Next appointment: 09/14/2022  Last refill: 06/06/22

## 2022-09-14 ENCOUNTER — Ambulatory Visit
Admit: 2022-09-14 | Discharge: 2022-09-14 | Payer: MEDICARE | Attending: Student in an Organized Health Care Education/Training Program | Primary: Student in an Organized Health Care Education/Training Program

## 2022-09-14 DIAGNOSIS — F03B0 Unspecified dementia, moderate, without behavioral disturbance, psychotic disturbance, mood disturbance, and anxiety: Secondary | ICD-10-CM

## 2022-09-14 NOTE — Progress Notes (Signed)
Amber Palmer (DOB:  1930-02-27) is a 89 y.o. female,Established patient, here for evaluation of the following chief complaint(s):  Follow-up      Assessment/Plan:     1. Moderate dementia without behavioral disturbance, psychotic disturbance, mood disturbance, or anxiety, unspecified dementia type (HCC)  -Aricept maxed out.  Continue 10 mg.  - Power - Jason Fila, Greer Ee, MD, Neurology, [place]        Return in about 6 months (around 03/16/2023) for HTN.           Subjective   SUBJECTIVE/OBJECTIVE:  HPI:   Patient presents for worsening memory  Unfortunately, there is no baseline MoCA on file  MoCA performed today score=17  Of note, patient is college graduate    Patient Active Problem List   Diagnosis    Hypertension    Hyperlipidemia    Vitamin D deficiency    Failure to thrive in adult    Severe malnutrition (HCC)    Weight loss    Elevated lipase    Dementia (HCC)     Past Medical History:   Diagnosis Date    Blood transfusion declined because patient is Jehovah's Witness     Hyperlipidemia     Hypertension     Pancreatic cancer (HCC) 08/18/2021    Pancreatitis     Vitamin D deficiency      Prior to Admission medications    Medication Sig Start Date End Date Taking? Authorizing Provider   atorvastatin (LIPITOR) 20 MG tablet Take 1 tablet by mouth daily 08/28/22   Lesia Sago, DO   tolterodine (DETROL LA) 4 MG extended release capsule Take 1 capsule by mouth daily 08/17/22 03/05/23  Lesia Sago, DO   amLODIPine (NORVASC) 10 MG tablet Take 1 tablet by mouth daily 08/17/22 03/05/23  Lesia Sago, DO   donepezil (ARICEPT) 10 MG tablet Take 1 tablet by mouth nightly 08/17/22 03/05/23  Lesia Sago, DO   zoster recombinant adjuvanted vaccine [facility]) 50 MCG/0.5ML SUSR injection Inject 0.5 mLs into the muscle See Admin Instructions 1 dose now and repeat in 2-6 months 08/02/22 01/29/23  Lesia Sago, DO   ferrous sulfate (FE TABS 325) 325 (65 Fe) MG EC tablet Take 1 tablet by mouth daily (with breakfast)    [provider]   Potassium 99 MG TABS Take by mouth daily (with breakfast)    [provider]   ondansetron (ZOFRAN-ODT) 4 MG disintegrating tablet Take 1 tablet by mouth 3 times daily as needed for Nausea or Vomiting 01/29/22   Dalbert Batman, MD   VITAMIN D PO Take 1 tablet by mouth daily    [provider]   Ascorbic Acid (VITAMIN C) 250 MG tablet Take 1 tablet by mouth daily    [provider]   Calcium Ascorbate 500 MG TABS Take 1 tablet by mouth 02/11/09   [provider]   calcium carbonate (OSCAL) 500 MG TABS tablet Take 1 tablet by mouth daily    [provider]     Social History     Socioeconomic History    Marital status: Divorced     Spouse name: Not on file    Number of children: Not on file    Years of education: Not on file    Highest education level: Not on file   Occupational History    Not on file   Tobacco Use    Smoking status: Never    Smokeless tobacco: Never   Substance and Sexual Activity  Alcohol use: No    Drug use: Never    Sexual activity: Defer   Other Topics Concern    Not on file   Social History Narrative    Not on file     Social Determinants of Health     Financial Resource Strain: Low Risk  (02/13/2022)    Overall Financial Resource Strain (CARDIA)     Difficulty of Paying Living Expenses: Not hard at all   Food Insecurity: Not on file (02/13/2022)   Transportation Needs: Unknown (02/13/2022)    PRAPARE - Therapist, art (Medical): Not on file     Lack of Transportation (Non-Medical): No   Physical Activity: Inactive (08/02/2022)    Exercise Vital Sign     Days of Exercise per Week: 0 days     Minutes of Exercise per Session: 10 min   Stress: Not on file   Social Connections: Not on file   Intimate Partner Violence: Not on file   Housing Stability: Unknown (02/13/2022)    Housing Stability Vital Sign     Unable to Pay for Housing in the Last Year: Not on file     Number of Places Lived in the Last Year: Not  on file     Unstable Housing in the Last Year: No     Past Surgical History:   Procedure Laterality Date    GASTROSTOMY TUBE PLACEMENT N/A 08/22/2021    EGD PEG TUBE PLACEMENT performed by Debria Garret, MD at [facility] ENDOSCOPY    STOMACH SURGERY N/A 04/16/2022    REMOVAL PEG TUBE performed by Debria Garret, MD at [facility] ENDOSCOPY    UPPER GASTROINTESTINAL ENDOSCOPY N/A 09/20/2021    EGD ESOPHAGOGASTRODUODENOSCOPY ULTRASOUND performed by Gerrie Nordmann, MD at [facility] ENDOSCOPY    UPPER GASTROINTESTINAL ENDOSCOPY N/A 09/20/2021    EGD POLYP SNARE performed by Gerrie Nordmann, MD at [facility] ENDOSCOPY    UPPER GASTROINTESTINAL ENDOSCOPY N/A 04/16/2022    EGD WITH PEG TUBE REMOVAL performed by Debria Garret, MD at [facility] ENDOSCOPY      No family history on file.   Allergies   Allergen Reactions    Bee Venom      Swelling at site          Objective   Vitals:    09/14/22 1449   BP: 106/64   Site: Right Upper Arm   Position: Sitting   Cuff Size: Medium Adult   Pulse: 98   SpO2: 98%   Weight: 54 kg (119 lb)     Physical Exam  Constitutional:       Appearance: Normal appearance.   Cardiovascular:      Rate and Rhythm: Normal rate.   Pulmonary:      Effort: Pulmonary effort is normal.   Neurological:      Mental Status: She is alert.   Psychiatric:         Mood and Affect: Mood normal.            An electronic signature was used to authenticate this note.    --Lesia Sago, DO   Total time spent for this encounter: Not billed by time    Documentation was done using voice recognition dragon software.  Every effort was made to ensure accuracy; however, inadvertent, unintentional computerized transcription errors may be present.  Marland Kitchen

## 2022-09-24 ENCOUNTER — Encounter
Admit: 2022-09-24 | Discharge: 2022-09-24 | Payer: MEDICARE | Primary: Student in an Organized Health Care Education/Training Program

## 2022-09-24 DIAGNOSIS — Z23 Encounter for immunization: Secondary | ICD-10-CM

## 2022-12-03 ENCOUNTER — Encounter
Admit: 2022-12-03 | Discharge: 2022-12-03 | Payer: MEDICARE | Attending: Student in an Organized Health Care Education/Training Program | Primary: Student in an Organized Health Care Education/Training Program

## 2022-12-03 VITALS — BP 90/62 | HR 82 | Wt 120.0 lb

## 2022-12-03 DIAGNOSIS — 1 ERRONEOUS ENCOUNTER ICD10: Secondary | ICD-10-CM

## 2022-12-04 NOTE — Progress Notes (Signed)
 Patient presents with son with paperwork from insurance home visit. Labs and health maintenance UTD. No services needed or rendered.  Gift card given.    Lesia Sago, DO

## 2023-02-06 ENCOUNTER — Encounter

## 2023-02-06 NOTE — Telephone Encounter (Signed)
 Last appointment: 09/14/2022  Next appointment: 03/22/2023  Last Fill: 08/17/22

## 2023-02-07 MED ORDER — DONEPEZIL HCL 10 MG PO TABS
10 MG | ORAL_TABLET | Freq: Every evening | ORAL | 1 refills | Status: DC
Start: 2023-02-07 — End: 2023-04-21

## 2023-02-07 MED ORDER — TOLTERODINE TARTRATE ER 4 MG PO CP24
4 MG | ORAL_CAPSULE | Freq: Every day | ORAL | 1 refills | Status: DC
Start: 2023-02-07 — End: 2023-04-21

## 2023-02-07 MED ORDER — AMLODIPINE BESYLATE 10 MG PO TABS
10 | ORAL_TABLET | Freq: Every day | ORAL | 1 refills | Status: DC
Start: 2023-02-07 — End: 2023-04-21

## 2023-03-22 ENCOUNTER — Ambulatory Visit
Admit: 2023-03-22 | Discharge: 2023-03-22 | Payer: MEDICARE | Attending: Student in an Organized Health Care Education/Training Program | Admitting: Student in an Organized Health Care Education/Training Program | Primary: Student in an Organized Health Care Education/Training Program

## 2023-03-22 VITALS — BP 128/68 | HR 81 | Wt 128.2 lb

## 2023-03-22 DIAGNOSIS — I10 Essential (primary) hypertension: Secondary | ICD-10-CM

## 2023-03-22 NOTE — Progress Notes (Signed)
 Amber Palmer (DOB:  1929-11-11) is a 89 y.o. female,Established patient, here for evaluation of the following chief complaint(s):  Follow-up and Hypertension      Assessment/Plan:     1. Primary hypertension  - BP controlled  - Continue amlodipine 10 mg

## 2023-04-16 ENCOUNTER — Encounter

## 2023-04-17 ENCOUNTER — Encounter

## 2023-04-17 NOTE — Telephone Encounter (Signed)
Last appointment: 03/22/2023  Next appointment: Visit date not found  Last Fill: 02/07/23

## 2023-04-17 NOTE — Telephone Encounter (Signed)
Last appointment: 03/22/2023  Next appointment: 04/17/2023  Last Fill: 02/07/2023

## 2023-04-21 MED ORDER — DONEPEZIL HCL 10 MG PO TABS
10 | ORAL_TABLET | Freq: Every evening | ORAL | 2 refills | 90.00000 days | Status: DC
Start: 2023-04-21 — End: 2023-12-24

## 2023-04-21 MED ORDER — AMLODIPINE BESYLATE 10 MG PO TABS
10 | ORAL_TABLET | Freq: Every day | ORAL | 2 refills | Status: DC
Start: 2023-04-21 — End: 2023-12-24

## 2023-04-21 MED ORDER — TOLTERODINE TARTRATE ER 4 MG PO CP24
4 | ORAL_CAPSULE | Freq: Every day | ORAL | 2 refills | Status: DC
Start: 2023-04-21 — End: 2023-11-13

## 2023-05-03 ENCOUNTER — Encounter

## 2023-05-04 LAB — COMPREHENSIVE METABOLIC PANEL
ALT: 25 U/L (ref 10–40)
AST: 24 U/L (ref 15–37)
Albumin/Globulin Ratio: 1.6 (ref 1.1–2.2)
Albumin: 4.4 g/dL (ref 3.4–5.0)
Alkaline Phosphatase: 72 U/L (ref 40–129)
Anion Gap: 9 (ref 3–16)
BUN: 23 mg/dL — ABNORMAL HIGH (ref 7–20)
CO2: 29 mmol/L (ref 21–32)
Calcium: 9.8 mg/dL (ref 8.3–10.6)
Chloride: 103 mmol/L (ref 99–110)
Creatinine: 1.1 mg/dL (ref 0.6–1.2)
Est, Glom Filt Rate: 47 — AB (ref >60)
Glucose: 90 mg/dL (ref 70–99)
Potassium: 4.3 mmol/L (ref 3.5–5.1)
Sodium: 141 mmol/L (ref 136–145)
Total Bilirubin: 0.4 mg/dL (ref 0.0–1.0)
Total Protein: 7.1 g/dL (ref 6.4–8.2)

## 2023-05-09 ENCOUNTER — Encounter

## 2023-05-10 NOTE — Telephone Encounter (Signed)
LVM for patient to return call about labs. PCP comment below:    Looks to be dehydrated. Drink more water. Repeat labs in 3 mos. And say happy bday to her for me:))     Lesia Sago, DO

## 2023-05-16 NOTE — Telephone Encounter (Signed)
LVM for patient to return call for results. Sent a letter since this was the second attempt.

## 2023-06-17 NOTE — Telephone Encounter (Signed)
 LVM for patient to return call to get rescheduled for appointment on 06/18/23.     Dr. Ala Dach will be out of office for maternity leave starting (unexpectedly) this week until August. If patient needs to be seen, let me know and I will call her to get scheduled with another provider at another location who's willing to help out. Or patient can r/s her appointment in August when she is supposed to be seen per PCP.

## 2023-06-18 ENCOUNTER — Encounter
Payer: MEDICARE | Attending: Student in an Organized Health Care Education/Training Program | Primary: Student in an Organized Health Care Education/Training Program

## 2023-10-02 ENCOUNTER — Encounter

## 2023-10-02 MED ORDER — HANDICAP PLACARD
0 refills | Status: AC
Start: 2023-10-02 — End: ?

## 2023-10-04 ENCOUNTER — Encounter

## 2023-10-07 MED ORDER — ATORVASTATIN CALCIUM 20 MG PO TABS
20 | ORAL_TABLET | Freq: Every day | ORAL | 0 refills | 90.00000 days | Status: DC
Start: 2023-10-07 — End: 2023-12-23

## 2023-10-07 NOTE — Telephone Encounter (Signed)
 Recent Visits  Date Type Provider Dept   03/22/23 Office Visit Primus Fast, DO [facility]   09/14/22 Office Visit Primus Fast, DO [facility]   08/02/22 Office Visit Primus Fast, DO [facility]   04/20/22 Office Visit Primus Fast, DO [facility]   Showing recent visits within past 540 days with a meds authorizing provider and meeting all other requirements  Future Appointments  Date Type Provider Dept   12/10/23 Appointment Primus Fast, DO [facility]   Showing future appointments within next 150 days with a meds authorizing provider and meeting all other requirements              Requested Prescriptions     Pending Prescriptions Disp Refills    atorvastatin  (LIPITOR ) 20 MG tablet [Pharmacy Med Name: Atorvastatin  Calcium  20 MG Oral Tablet] 100 tablet 2     Sig: TAKE 1 TABLET BY MOUTH ONCE  DAILY

## 2023-11-11 ENCOUNTER — Encounter

## 2023-11-12 NOTE — Telephone Encounter (Signed)
 Recent Visits  Date Type Provider Dept   03/22/23 Office Visit Primus Fast, DO [facility]   09/14/22 Office Visit Primus Fast, DO [facility]   08/02/22 Office Visit Primus Fast, DO [facility]   Showing recent visits within past 540 days with a meds authorizing provider and meeting all other requirements  Future Appointments  Date Type Provider Dept   12/10/23 Appointment Primus Fast, DO [facility]   Showing future appointments within next 150 days with a meds authorizing provider and meeting all other requirements              Requested Prescriptions     Pending Prescriptions Disp Refills    tolterodine  (DETROL  LA) 4 MG extended release capsule [Pharmacy Med Name: TOLTERODINE   4MG   CAP  EXTENDED RELEASE] 30 capsule 5     Sig: TAKE 1 CAPSULE BY MOUTH DAILY

## 2023-11-13 MED ORDER — TOLTERODINE TARTRATE ER 4 MG PO CP24
4 | ORAL_CAPSULE | Freq: Every day | ORAL | 5 refills | 30.00000 days | Status: DC
Start: 2023-11-13 — End: 2024-03-05

## 2023-12-10 ENCOUNTER — Ambulatory Visit
Admit: 2023-12-10 | Discharge: 2023-12-19 | Payer: Medicare (Managed Care) | Attending: Student in an Organized Health Care Education/Training Program | Primary: Student in an Organized Health Care Education/Training Program

## 2023-12-10 ENCOUNTER — Encounter

## 2023-12-10 NOTE — Progress Notes (Signed)
 Medicare Annual Wellness Visit    Amber Palmer is here for Medicare AWV    Assessment & Plan   Medicare annual wellness visit, subsequent  - Discussed age appropriate preventive care including healthy diet, daily exercise, immunizations and age & gender guided screening test  - Mediterranean lifestyle discussed and encouraged; handout given    Mixed hyperlipidemia  -     Lipid, Fasting; Future  - Continue atorvastatin  20 mg daily     Primary hypertension  - Controlled  - Continue Amlodipine  10 mg    -     Comprehensive Metabolic Panel; Future    Moderate dementia without behavioral disturbance, psychotic disturbance, mood disturbance, or anxiety, unspecified dementia type (HCC)  - Stable  - Continue donepezil     Tinnitus of right ear  - Continue to monitor   - Consider ENT referral if worsens     Wears dentures     Return in 1 year (on 12/09/2024) for Medicare AWV + 6 mos HLD and HTN.     Subjective   The following acute and/or chronic problems were also addressed today:  Tinnitus  - Intermittent ringing in the ears for the past two weeks  - Ringing is not constant  - No associated ear pain  - No hearing loss  - No aspirin use  - Ringing occurs sporadically  - Does not significantly impair daily life    Ringing in ears for past 2 weeks   Comes and goes  Lasts for a few minutes  Denies ear pain, hearing loss    Gait disturbance  - Difficulty walking  - Has a cane but does not use it regularly  - Has access to a walker, used only a few times  - Difficulty walking to church  - Prefers not to use cane or walker unless necessary      Patient's complete Health Risk Assessment and screening values have been reviewed and are found in Flowsheets. The following problems were reviewed today and where indicated follow up appointments were made and/or referrals ordered.    Positive Risk Factor Screenings with Interventions:               Poor Eating Habits/Diet:  Do you eat balanced/healthy meals regularly?: (!)  No  Interventions:  See AVS for additional education material     Dentist Screen:  Have you seen the dentist within the past year?: (!) No  Intervention:  Not applicable - dentures     Vision Screen:  Do you have difficulty driving, watching TV, or doing any of your daily activities because of your eyesight?: No  Have you had an eye exam within the past year?: (!) No  Interventions:   Patient encouraged to make appointment with their eye specialist    Safety:  Do you have any tripping hazards - loose or unsecured carpets or rugs?: (!) Yes  Interventions:  See AVS for additional education material            Wt Readings from Last 3 Encounters:   12/10/23 60.8 kg (134 lb)   03/22/23 58.2 kg (128 lb 3.2 oz)   12/03/22 54.4 kg (120 lb)             Objective   Vitals:    12/10/23 1217   BP: 122/68   BP Site: Right Upper Arm   Patient Position: Sitting   BP Cuff Size: Large Adult   Pulse: 72   SpO2: 98%   Weight:  60.8 kg (134 lb)   Height: 1.499 m (4' 11)      Body mass index is 27.06 kg/m.      Physical Exam  Constitutional:       Appearance: Normal appearance.   Cardiovascular:      Rate and Rhythm: Normal rate and regular rhythm.      Pulses: Normal pulses.      Heart sounds: Normal heart sounds.   Pulmonary:      Effort: Pulmonary effort is normal.      Breath sounds: Normal breath sounds.   Abdominal:      General: Bowel sounds are normal.      Palpations: Abdomen is soft.   Musculoskeletal:      Right lower leg: No edema.      Left lower leg: No edema.   Neurological:      Mental Status: She is alert.                 Allergies   Allergen Reactions    Bee Venom      Swelling at site     Prior to Visit Medications   Medication Sig Taking? Authorizing Provider   tolterodine  (DETROL  LA) 4 MG extended release capsule TAKE 1 CAPSULE BY MOUTH DAILY Yes Syesha Thaw, DO   atorvastatin  (LIPITOR ) 20 MG tablet TAKE 1 TABLET BY MOUTH ONCE  DAILY Yes Robinson, Alaba Devonne, MD   Handicap Placard MISC by Does not apply route  3 years Yes Lavender, Leoma Annette, APRN - CNP   donepezil  (ARICEPT ) 10 MG tablet TAKE 1 TABLET BY MOUTH EVERY  NIGHT Yes Aisley Whan, DO   amLODIPine  (NORVASC ) 10 MG tablet TAKE 1 TABLET BY MOUTH DAILY Yes Monik Lins, DO   Multiple Vitamins-Minerals (ONE-A-DAY WOMENS) TABS Take by mouth daily 50 + vitamins Yes [provider]   B Complex-C (VITAMIN B - VITAMIN C) TABS Take by mouth Vitamin A Yes [provider]   ferrous sulfate (FE TABS 325) 325 (65 Fe) MG EC tablet Take 1 tablet by mouth daily (with breakfast) Yes [provider]   Potassium 99 MG TABS Take by mouth daily (with breakfast) Yes [provider]   VITAMIN D PO Take 1 tablet by mouth daily Yes [provider]   calcium  carbonate (OSCAL) 500 MG TABS tablet Take 1 tablet by mouth daily Yes [provider]   Calcium  Ascorbate 500 MG TABS Take 1 tablet by mouth  Patient not taking: Reported on 12/10/2023  [provider]       CareTeam (Including outside providers/suppliers regularly involved in providing care):   Patient Care Team:  Primus Fast, DO as PCP - General (Family Medicine)  Primus Fast, DO as PCP - Empaneled Provider     Recommendations for Preventive Services Due: see orders and patient instructions/AVS.  Recommended screening schedule for the next 5-10 years is provided to the patient in written form: see Patient Instructions/AVS.     Reviewed and updated this visit:  Tobacco  Allergies  Meds  Problems  Med Hx  Surg Hx  Fam Hx

## 2023-12-10 NOTE — Patient Instructions (Addendum)
 Get the Screenings You Need   Screenings are tests that look for diseases before you have symptoms. Blood pressure checks and mammograms are examples of screenings.  You can get some screenings, such as blood pressure readings, in your doctor's office. Others, such as mammograms, need special equipment, so you may need to go to a different office.  After a screening test, ask when you will see the results and who to talk to about them.  Breast Cancer  Ask your health care team whether a mammogram is right for you based on your age, family history, overall health, and personal concerns.  Cervical Cancer  Have a Pap smear every 1 to 3 years if you are 40 to 88 years old and have been sexually active. If you are older than 65 and recent Pap smears were normal, you do not need a Pap smear. If you have had a hysterectomy for a reason other than cancer, you do not need a Pap smear.  Chlamydia and Other Sexually Transmitted Diseases  Sexually transmitted diseases can make it hard to get pregnant, may affect your baby, and can cause other health problems.   Have a screening test for Chlamydia if you are 24 or younger and sexually active. If you are older than 24, talk to your health care team about being screened for Chlamydia.   Ask your doctor or nurse whether you should be screened for other sexually transmitted diseases.  Colorectal Cancer  Have a screening test for colorectal cancer starting at age 53. If you have a family history of colorectal cancer, you may need to be screened earlier. Several different tests can detect this cancer. Your health care team can help you decide which is best for you.  Depression  Your emotional health is as important as your physical health. Talk to your health care team about being screened for depression, especially if during the last 2 weeks:   You have felt down, sad, or hopeless.   You have felt little interest or pleasure in doing things.  Diabetes  Get screened for diabetes if  your blood pressure is higher than 135/80 or if you take medication for high blood pressure.  Diabetes (high blood sugar) can cause problems with your heart, brain, eyes, feet, kidneys, nerves, and other body parts.  High Blood Pressure  Starting at age 20, have your blood pressure checked at least every 2 years. High blood pressure is 140/90 or higher. High blood pressure can cause stroke, heart attack, kidney and eye problems, and heart failure.  High Cholesterol  Starting at age 104, have your cholesterol checked regularly if:   You use tobacco.   You are obese.   You have diabetes or high blood pressure.   You have a personal history of heart disease or blocked arteries.   A man in your family had a heart attack before age 75 or a woman, before age 55.  It's Your Body!    You know your body better than anyone else. Always tell your doctor or nurse about any changes in your health, including your vision and hearing. Ask them about being checked for any condition you are concerned about, not just the ones here. If you are wondering about diseases such as glaucoma or skin cancer, for example, ask about them.   HIV  Talk with your health care team about HIV screening if any of these apply to you:   You have had unprotected sex with multiple partners.  You have injected drugs.   You exchange sex for money or drugs or have sex partners who do.   You have or had a sex partner who is HIV-infected, bisexual, or injects drugs.   You are being treated for a sexually transmitted disease.   You had a blood transfusion between 1978 and 1985.   You have any other concerns.  Osteoporosis (Bone Thinning)  Have a screening test at age 57 to make sure your bones are strong. If you are younger than 42, talk to your health care team about whether you should be tested.  Overweight and Obesity  The best way to learn if you are overweight or obese is to find your body mass index (BMI). You can find your BMI by entering your height and  weight into a BMI calculator, such as the one available at: WirelessPursuit.com.cy. A BMI between 18.5 and 25 indicates a normal weight. Persons with a BMI of 30 or higher may be obese. If you are obese, talk to your doctor or nurse about seeking intensive counseling and help with changing your behaviors to lose weight. Overweight and obesity can lead to diabetes and cardiovascular disease.  Take Preventive Medicines If You Need Them  Aspirin  If you are 55 or older, ask your health care team if you should take aspirin to prevent strokes.  Breast Cancer Drugs  If your mother, sister, or daughter has had breast cancer, talk to your doctor about whether you should take medicines to prevent breast cancer.  Estrogen for Menopause (Hormone Replacement Therapy)  Do not use estrogen to prevent heart disease or other diseases. If you need relief from symptoms of menopause, talk with your health care team.  Immunizations  Get a flu shot every year.   If you are 26 or older, get a pneumonia shot.   Depending on health problems, you may need a pneumonia shot at a younger age or need shots to prevent diseases like whooping cough or shingles.   Talk with your health care team about whether you need vaccinations. You can also find which ones you need by going to: http://cohen-reilly.biz/.  Take Steps to Good Health  Be physically active and make healthy food choices. Learn how at FlowerCheck.be.   Get to a healthy weight and stay there. Balance the calories you take in from food and drink with the calories you burn off by your activities.   Be tobacco free. For tips on how to quit, go to http://www.davis-sullivan.com/. To talk to someone about how to quit, call the [employer]: 1-800-QUITNOW 682-311-0117).   If you drink alcohol, have no more than one drink per day. A standard drink is one 12-ounce bottle of beer or wine cooler, one 5-ounce glass of wine, or 1.5 ounces of 80-proof  distilled spirits.   Get More Information on Good Health  Check out these [employer] sites:  Healthfinder.gov  Guides and tools for healthy living, an Financial planner of health-related topics, health news, and more. Go to: UGLive.com.cy.  MedlinePlus  Health information from government agencies and health organizations, including a medical encyclopedia and health tools. Go to: LaserRates.fr.  Questions Are the Answer  Information on how to get involved in your health care by asking questions, understanding your condition, and learning about your options. Go to: SkincareIndustry.si.  If you don't have access to a computer, talk to your local librarian about health information in [employer].  Sources  The information in this pamphlet is based  on research from the U.S. Department of Health and Health and safety inspector and the U.S. Chief Financial Officer (USPSTF). The USPSTF, supported by [facility], is a Development worker, international aid of medical experts that makes recommendations based on scientific evidence about which clinical preventive services should be included in primary medical care and for which populations.  For information about the USPSTF and its recommendations, go to: https://nielsen.com/.   U.S. Department of Health and Hydrologist for Healthcare Research and Quality  AHRQ Pub. No. 10-IP002-A  Replaces AHRQ Pub. No. 07-IP005-A  Current as of September 2010      Learning About Dental Care for Older Adults  Dental care for older adults: Overview  Dental care for older people is much the same as for younger adults. But older adults do have concerns that younger adults do not. Older adults may have problems with gum disease and decay on the roots of their teeth. They may need missing teeth replaced or broken fillings fixed. Or they may have dentures that need to be cared for. Some older adults may have trouble holding a  toothbrush.  You can help remind the person you are caring for to brush and floss their teeth or to clean their dentures. In some cases, you may need to do the brushing and other dental care tasks. People who have trouble using their hands or who have dementia may need this extra help.  How can you help with dental care?  Normal dental care  To keep the teeth and gums healthy:  Brush the teeth with fluoride toothpaste twice a day--in the morning and at night--and floss at least once a day. Plaque can quickly build up on the teeth of older adults.  Watch for the signs of gum disease. These signs include gums that bleed after brushing or after eating hard foods, such as apples.  See a dentist regularly. Many experts recommend checkups every 6 months.  Keep the dentist up to date on any new medications the person is taking.  Encourage a balanced diet that includes whole grains, vegetables, and fruits, and that is low in saturated fat and sodium.  Encourage the person you're caring for not to use tobacco products. They can affect dental and general health.  Many older adults have a fixed income and feel that they can't afford dental care. But most towns and cities have programs in which dentists help older adults by lowering fees. Contact your area's public health offices or social services for information about dental care in your area.  Using a toothbrush  Older adults with arthritis sometimes have trouble brushing their teeth because they can't easily hold the toothbrush. Their hands and fingers may be stiff, painful, or weak. If this is the case, you can:  Offer an Mining engineer toothbrush.  Enlarge the handle of a non-electric toothbrush by wrapping a sponge, an elastic bandage, or adhesive tape around it.  Push the toothbrush handle through a ball made of rubber or soft foam.  Make the handle longer and thicker by taping Popsicle sticks or tongue depressors to it.  You may also be able to buy special toothbrushes,  toothpaste dispensers, and floss holders.  Your doctor may recommend a soft-bristle toothbrush if the person you care for bleeds easily. Bleeding can happen because of a health problem or from certain medicines.  A toothpaste for sensitive teeth may help if the person you care for has sensitive teeth.  How do you brush and floss someone's teeth?  If the person you are caring for has a hard time cleaning their teeth on their own, you may need to brush and floss their teeth for them. It may be easiest to have the person sit and face away from you, and to sit or stand behind them. That way you can steady their head against your arm as you reach around to floss and brush their teeth. Choose a place that has good lighting and is comfortable for both of you.  Before you begin, gather your supplies. You will need gloves, floss, a toothbrush, and a container to hold water if you are not near a sink. Wash and dry your hands well and put on gloves. Start by flossing:  Gently work a piece of floss between each of the teeth toward the gums. A plastic flossing tool may make this easier, and they are available at most drugstores.  Curve the floss around each tooth into a U-shape and gently slide it under the gum line.  Move the floss firmly up and down several times to scrape off the plaque.  After you've finished flossing, throw away the used floss and begin brushing:  Wet the brush and apply toothpaste.  Place the brush at a 45-degree angle where the teeth meet the gums. Press firmly, and move the brush in small circles over the surface of the teeth.  Be careful not to brush too hard. Vigorous brushing can make the gums pull away from the teeth and can scratch the tooth enamel.  Brush all surfaces of the teeth, on the tongue side and on the cheek side. Pay special attention to the front teeth and all surfaces of the back teeth.  Brush chewing surfaces with short back-and-forth strokes.  After you've finished, help the person  rinse the remaining toothpaste from their mouth.  Where can you learn more?  Go to RecruitSuit.ca and enter F944 to learn more about Learning About Dental Care for Older Adults.  Current as of: October 24, 2022  Content Version: 14.6   2024-2025 [place], [place].   Care instructions adapted under license by [facility]. If you have questions about a medical condition or this instruction, always ask your healthcare professional. Romayne Alderman, [facility], disclaims any warranty or liability for your use of this information.         Learning About Vision Tests  What are vision tests?     The four most common vision tests are visual acuity tests, refraction, visual field tests, and color vision tests.  Visual acuity (sharpness) tests  These tests are used:  To see if you need glasses or contact lenses.  To monitor an eye problem.  To check an eye injury.  Visual acuity tests are done as part of routine exams. You may also have this test when you get your driver's license or apply for some types of jobs.  Visual field tests  These tests are used:  To check for vision loss in any area of your range of vision.  To screen for certain eye diseases.  To look for nerve damage after a stroke, head injury, or other problem that could reduce blood flow to the brain.  Refraction and color tests  A refraction test is done to find the right prescription for glasses and contact lenses.  A color vision test is done to check for color blindness.  Color vision is often tested as part of a routine exam. You may also have this test when you  apply for a job where recognizing different colors is important, such as truck driving, Optician, dispensing, or the [employer].  How are vision tests done?  Visual acuity test   You cover one eye at a time.  You read aloud from a wall chart across the room.  You read aloud from a small card that you hold in your hand.  Refraction   You look into a special device.  The device puts  lenses of different strengths in front of each eye to see how strong your glasses or contact lenses need to be.  Visual field tests   Your doctor may have you look through special machines.  Or your doctor may simply have you stare straight ahead while they move a finger into and out of your field of vision.  Color vision test   You look at pieces of printed test patterns in various colors. You say what number or symbol you see.  Your doctor may have you trace the number or symbol using a pointer.  How do these tests feel?  There is very little chance of having a problem from this test. If dilating drops are used for a vision test, they may make the eyes sting and cause a medicine taste in the mouth.  Follow-up care is a key part of your treatment and safety. Be sure to make and go to all appointments, and call your doctor if you are having problems. It's also a good idea to know your test results and keep a list of the medicines you take.  Where can you learn more?  Go to RecruitSuit.ca and enter G551 to learn more about Learning About Vision Tests.  Current as of: October 24, 2022  Content Version: 14.6   2024-2025 [place], [place].   Care instructions adapted under license by [facility]. If you have questions about a medical condition or this instruction, always ask your healthcare professional. Romayne Alderman, [facility], disclaims any warranty or liability for your use of this information.         Eating Healthy Foods: Care Instructions  With every meal, you can make healthy food choices. Try to eat a variety of fruits, vegetables, whole grains, lean proteins, and low-fat dairy products. This can help you get the right balance of nutrients, including vitamins and minerals. Small changes add up over time. You can start by adding one healthy food to your meals each day.    Try to make half your plate fruits and vegetables, one-fourth whole grains, and one-fourth lean proteins. Try  including dairy with your meals.   Eat more fruits and vegetables. Try to have them with most meals and snacks.   Foods for healthy eating        Fruits   These can be fresh, frozen, canned, or dried.  Try to choose whole fruit rather than fruit juice.  Eat a variety of colors.        Vegetables   These can be fresh, frozen, canned, or dried.  Beans, peas, and lentils count too.        Whole grains   Choose whole-grain breads, cereals, and noodles.  Try brown rice.        Lean proteins   These can include lean meat, poultry, fish, and eggs.  You can also have tofu, beans, peas, lentils, nuts, and seeds.        Dairy   Try milk, yogurt, and cheese.  Choose low-fat or fat-free when you can.  If you need to, use lactose-free milk or fortified plant-based milk products, such as soy milk.        Water   Drink water when you're thirsty.  Limit sugar-sweetened drinks, including soda, fruit drinks, and sports drinks.  Where can you learn more?  Go to RecruitSuit.ca and enter T756 to learn more about Eating Healthy Foods: Care Instructions.  Current as of: December 31, 2022  Content Version: 14.6   2024-2025 [place], [place].   Care instructions adapted under license by [facility]. If you have questions about a medical condition or this instruction, always ask your healthcare professional. Romayne Alderman, [facility], disclaims any warranty or liability for your use of this information.         A Healthy Heart: Care Instructions  Overview    Coronary artery disease, also called heart disease, occurs when a substance called plaque builds up in the vessels that supply oxygen-rich blood to your heart muscle. This can narrow the blood vessels and reduce blood flow. A heart attack happens when blood flow is completely blocked. A high-fat diet, smoking, and other factors increase the risk of heart disease.  Your doctor has found that you have a chance of having heart disease. A heart-healthy lifestyle can  help keep your heart healthy and prevent heart disease. This lifestyle includes eating healthy, being active, staying at a weight that's healthy for you, and not smoking, vaping, or using other tobacco or nicotine products. It also includes taking medicines as directed, managing other health conditions, and trying to get a healthy amount of sleep.  Follow-up care is a key part of your treatment and safety. Be sure to make and go to all appointments, and contact your doctor if you are having problems. It's also a good idea to know your test results and keep a list of the medicines you take.  How can you care for yourself at home?  Diet  Use less salt when you cook and eat. This helps lower your blood pressure. Taste food before salting. Add only a little salt when you think you need it. With time, your taste buds will adjust to less salt.  Eat fewer snack items, fast foods, canned soups, and other high-salt, high-fat, processed foods.  Read food labels and try to avoid saturated and trans fats. They increase your risk of heart disease by raising cholesterol levels.  Limit the amount of solid fat--butter, margarine, and shortening--you eat. Use olive, peanut, or canola oil when you cook. Bake, broil, and steam foods instead of frying them.  Eat a variety of fruit and vegetables every day. Dark green, deep orange, red, or yellow fruits and vegetables are especially good for you. Examples include spinach, carrots, peaches, and berries.  Foods high in fiber can reduce your cholesterol and provide important vitamins and minerals. High-fiber foods include whole-grain cereals and breads, oatmeal, beans, brown rice, citrus fruits, and apples.  Eat lean proteins. Heart-healthy proteins include seafood, lean meats and poultry, eggs, beans, peas, nuts, seeds, and soy products.  Limit drinks and foods with added sugar. These include candy, desserts, and soda pop.  Heart-healthy lifestyle  If your doctor recommends it, get more  exercise. For many people, walking is a good choice. Or you may want to swim, bike, or do other activities. Bit by bit, increase the time you're active every day. Try for at least 30 minutes on most days of the week.  If you smoke, vape, or use other tobacco or  nicotine products, try to quit. If you can't quit, cut back as much as you can. If you need help quitting, talk to your doctor about quit programs and medicines. Quitting is one of the most important things you can do to protect your heart. Also avoid secondhand smoke and the aerosol mist from vaping.  Stay at a weight that's healthy for you. Talk to your doctor if you need help losing weight.  Try to get 7 to 9 hours of sleep each night.  Limit alcohol to 2 drinks a day for men and 1 drink a day for women. Too much alcohol can cause health problems.  Manage other health problems such as diabetes, high blood pressure, and high cholesterol. If you think you may have a problem with alcohol or drug use, talk to your doctor.  Medicines  Take your medicines exactly as prescribed. Contact your doctor if you think you are having a problem with your medicine.  When should you call for help?  Call 911 if you have symptoms of a heart attack. These may include:  Chest pain or pressure, or a strange feeling in the chest.  Sweating.  Shortness of breath.  Pain, pressure, or a strange feeling in the back, neck, jaw, or upper belly or in one or both shoulders or arms.  Lightheadedness or sudden weakness.  A fast or irregular heartbeat.  After you call 911, the operator may tell you to chew 1 adult-strength or 2 to 4 low-dose aspirin. Wait for an ambulance. Do not try to drive yourself.  Watch closely for changes in your health, and be sure to contact your doctor if you have any problems.  Where can you learn more?  Go to RecruitSuit.ca and enter F075 to learn more about A Healthy Heart: Care Instructions.  Current as of: October 24, 2022  Content  Version: 14.6   2024-2025 [place], [place].   Care instructions adapted under license by [facility]. If you have questions about a medical condition or this instruction, always ask your healthcare professional. Romayne Alderman, [facility], disclaims any warranty or liability for your use of this information.    Personalized Preventive Plan for Amber Palmer - 12/10/2023  Medicare offers a range of preventive health benefits. Some of the tests and screenings are paid in full while other may be subject to a deductible, co-insurance, and/or copay.  Some of these benefits include a comprehensive review of your medical history including lifestyle, illnesses that may run in your family, and various assessments and screenings as appropriate.  After reviewing your medical record and screening and assessments performed today your provider may have ordered immunizations, labs, imaging, and/or referrals for you.  A list of these orders (if applicable) as well as your Preventive Care list are included within your After Visit Summary for your review.

## 2023-12-11 LAB — LIPID, FASTING
Cholesterol, Fasting: 169 mg/dL (ref 0–199)
HDL: 68 mg/dL — ABNORMAL HIGH (ref 40–60)
LDL Cholesterol: 93 mg/dL (ref <100)
Triglyceride, Fasting: 40 mg/dL (ref 0–150)
VLDL Cholesterol Calculated: 8 mg/dL

## 2023-12-11 LAB — COMPREHENSIVE METABOLIC PANEL
ALT: 26 U/L (ref 10–40)
AST: 25 U/L (ref 15–37)
Albumin/Globulin Ratio: 1.6 (ref 1.1–2.2)
Albumin: 4.2 g/dL (ref 3.4–5.0)
Alkaline Phosphatase: 68 U/L (ref 40–129)
Anion Gap: 11 (ref 3–16)
BUN: 20 mg/dL (ref 7–20)
CO2: 27 mmol/L (ref 21–32)
Calcium: 9.9 mg/dL (ref 8.3–10.6)
Chloride: 103 mmol/L (ref 99–110)
Creatinine: 1 mg/dL (ref 0.6–1.2)
Est, Glom Filt Rate: 53 — AB
Glucose: 106 mg/dL — ABNORMAL HIGH (ref 70–99)
Potassium: 4 mmol/L (ref 3.5–5.1)
Sodium: 141 mmol/L (ref 136–145)
Total Bilirubin: 0.5 mg/dL (ref 0.0–1.0)
Total Protein: 6.9 g/dL (ref 6.4–8.2)

## 2023-12-23 ENCOUNTER — Encounter

## 2023-12-24 MED ORDER — ATORVASTATIN CALCIUM 20 MG PO TABS
20 | ORAL_TABLET | Freq: Every day | ORAL | 2 refills | 90.00000 days | Status: AC
Start: 2023-12-24 — End: ?

## 2023-12-24 MED ORDER — AMLODIPINE BESYLATE 10 MG PO TABS
10 | ORAL_TABLET | Freq: Every day | ORAL | 1 refills | 30.00000 days | Status: DC
Start: 2023-12-24 — End: 2024-03-27

## 2023-12-24 MED ORDER — DONEPEZIL HCL 10 MG PO TABS
10 | ORAL_TABLET | Freq: Every evening | ORAL | 2 refills | 90.00000 days | Status: AC
Start: 2023-12-24 — End: ?

## 2023-12-24 NOTE — Telephone Encounter (Signed)
 Recent Visits  Date Type Provider Dept   12/10/23 Office Visit Primus Fast, DO [facility]   03/22/23 Office Visit Primus Fast, DO [facility]   09/14/22 Office Visit Primus Fast, DO [facility]   08/02/22 Office Visit Primus Fast, DO [facility]   Showing recent visits within past 540 days with a meds authorizing provider and meeting all other requirements  Future Appointments  No visits were found meeting these conditions.  Showing future appointments within next 150 days with a meds authorizing provider and meeting all other requirements              Requested Prescriptions     Pending Prescriptions Disp Refills    amLODIPine  (NORVASC ) 10 MG tablet [Pharmacy Med Name: amLODIPine  Besylate 10 MG Oral Tablet] 100 tablet 2     Sig: TAKE 1 TABLET BY MOUTH DAILY    donepezil  (ARICEPT ) 10 MG tablet [Pharmacy Med Name: Donepezil  HCl 10 MG Oral Tablet] 100 tablet 2     Sig: TAKE 1 TABLET BY MOUTH EVERY  NIGHT

## 2024-03-04 ENCOUNTER — Encounter

## 2024-03-05 MED ORDER — TOLTERODINE TARTRATE ER 2 MG PO CP24
2 | ORAL_CAPSULE | Freq: Every day | ORAL | 1 refills | Status: AC
Start: 2024-03-05 — End: 2024-09-01

## 2024-03-05 NOTE — Telephone Encounter (Signed)
"  Recent Visits  Date Type Provider Dept   12/10/23 Office Visit Primus Fast, DO [facility]   03/22/23 Office Visit Primus Fast, DO [facility]   09/14/22 Office Visit Primus Fast, DO [facility]   Showing recent visits within past 540 days with a meds authorizing provider and meeting all other requirements  Future Appointments  Date Type Provider Dept   06/08/24 Appointment Primus Fast, DO [facility]   Showing future appointments within next 150 days with a meds authorizing provider and meeting all other requirements              Requested Prescriptions     Pending Prescriptions Disp Refills    tolterodine  (DETROL  LA) 4 MG extended release capsule [Pharmacy Med Name: TOLTERODINE   4MG   CAP  EXTENDED RELEASE] 30 capsule 11     Sig: TAKE 1 CAPSULE BY MOUTH DAILY     "

## 2024-03-25 ENCOUNTER — Encounter

## 2024-03-27 MED ORDER — AMLODIPINE BESYLATE 10 MG PO TABS
10 | ORAL_TABLET | Freq: Every day | ORAL | 2 refills | 30.00000 days | Status: AC
Start: 2024-03-27 — End: 2024-10-13

## 2024-03-27 NOTE — Telephone Encounter (Signed)
"  Recent Visits  Date Type Provider Dept   12/10/23 Office Visit Primus Fast, DO [facility]   03/22/23 Office Visit Primus Fast, DO [facility]   Showing recent visits within past 540 days with a meds authorizing provider and meeting all other requirements  Future Appointments  Date Type Provider Dept   06/08/24 Appointment Primus Fast, DO [facility]   Showing future appointments within next 150 days with a meds authorizing provider and meeting all other requirements              Requested Prescriptions     Pending Prescriptions Disp Refills    amLODIPine  (NORVASC ) 10 MG tablet [Pharmacy Med Name: amLODIPine  Besylate 10 MG Oral Tablet] 100 tablet 2     Sig: TAKE 1 TABLET BY MOUTH DAILY     "
